# Patient Record
Sex: Female | Born: 1955 | Race: Black or African American | Hispanic: No | Smoking: Former smoker
Health system: Southern US, Community
[De-identification: ages and names within clinical notes are randomized; demographics above are authoritative.]

## PROBLEM LIST (undated history)

## (undated) DIAGNOSIS — C801 Malignant (primary) neoplasm, unspecified: Secondary | ICD-10-CM

## (undated) DIAGNOSIS — Z803 Family history of malignant neoplasm of breast: Secondary | ICD-10-CM

## (undated) DIAGNOSIS — I10 Essential (primary) hypertension: Secondary | ICD-10-CM

## (undated) DIAGNOSIS — E119 Type 2 diabetes mellitus without complications: Secondary | ICD-10-CM

## (undated) DIAGNOSIS — I2510 Atherosclerotic heart disease of native coronary artery without angina pectoris: Secondary | ICD-10-CM

## (undated) HISTORY — DX: Family history of malignant neoplasm of breast: Z80.3

---

## 1995-08-25 HISTORY — PX: WRIST SURGERY: SHX841

## 2007-08-25 HISTORY — PX: ANKLE SURGERY: SHX546

## 2018-10-12 ENCOUNTER — Other Ambulatory Visit (HOSPITAL_COMMUNITY): Payer: Self-pay | Admitting: Family Medicine

## 2018-10-12 DIAGNOSIS — N63 Unspecified lump in unspecified breast: Secondary | ICD-10-CM

## 2018-10-25 ENCOUNTER — Ambulatory Visit (HOSPITAL_COMMUNITY)
Admission: RE | Admit: 2018-10-25 | Discharge: 2018-10-25 | Disposition: A | Discharge status: Home / Self Care | Payer: PRIVATE HEALTH INSURANCE | Source: Ambulatory Visit | Attending: Family Medicine | Admitting: Family Medicine

## 2018-10-25 ENCOUNTER — Other Ambulatory Visit (HOSPITAL_COMMUNITY): Payer: Self-pay | Admitting: Family Medicine

## 2018-10-25 DIAGNOSIS — N63 Unspecified lump in unspecified breast: Secondary | ICD-10-CM | POA: Insufficient documentation

## 2018-11-01 ENCOUNTER — Ambulatory Visit (HOSPITAL_COMMUNITY)
Admission: RE | Admit: 2018-11-01 | Discharge: 2018-11-01 | Disposition: A | Discharge status: Home / Self Care | Payer: PRIVATE HEALTH INSURANCE | Source: Ambulatory Visit | Attending: Family Medicine | Admitting: Family Medicine

## 2018-11-01 ENCOUNTER — Other Ambulatory Visit (HOSPITAL_COMMUNITY): Payer: Self-pay | Admitting: Family Medicine

## 2018-11-01 DIAGNOSIS — C50412 Malignant neoplasm of upper-outer quadrant of left female breast: Secondary | ICD-10-CM | POA: Insufficient documentation

## 2018-11-01 DIAGNOSIS — C773 Secondary and unspecified malignant neoplasm of axilla and upper limb lymph nodes: Secondary | ICD-10-CM | POA: Diagnosis not present

## 2018-11-01 DIAGNOSIS — R928 Other abnormal and inconclusive findings on diagnostic imaging of breast: Secondary | ICD-10-CM | POA: Diagnosis present

## 2018-11-01 DIAGNOSIS — N63 Unspecified lump in unspecified breast: Secondary | ICD-10-CM

## 2018-11-01 DIAGNOSIS — Z170 Estrogen receptor positive status [ER+]: Secondary | ICD-10-CM | POA: Diagnosis not present

## 2018-11-01 MED ORDER — LIDOCAINE HCL (PF) 2 % IJ SOLN
INTRAMUSCULAR | Status: AC
  Filled 2018-11-01: qty 10

## 2018-11-01 MED ORDER — LIDOCAINE-EPINEPHRINE (PF) 1 %-1:200000 IJ SOLN
INTRAMUSCULAR | Status: AC
  Administered 2018-11-01: 14 mL
  Filled 2018-11-01: qty 30

## 2018-11-07 ENCOUNTER — Other Ambulatory Visit (HOSPITAL_COMMUNITY): Payer: Self-pay | Admitting: Family Medicine

## 2018-11-08 ENCOUNTER — Other Ambulatory Visit (HOSPITAL_COMMUNITY): Payer: Self-pay | Admitting: Family Medicine

## 2018-11-08 DIAGNOSIS — D0512 Intraductal carcinoma in situ of left breast: Secondary | ICD-10-CM

## 2018-11-17 ENCOUNTER — Other Ambulatory Visit: Payer: Self-pay

## 2018-11-17 ENCOUNTER — Ambulatory Visit (HOSPITAL_COMMUNITY)
Admission: RE | Admit: 2018-11-17 | Discharge: 2018-11-17 | Disposition: A | Discharge status: Home / Self Care | Payer: PRIVATE HEALTH INSURANCE | Source: Ambulatory Visit | Attending: Family Medicine | Admitting: Family Medicine

## 2018-11-17 DIAGNOSIS — D0512 Intraductal carcinoma in situ of left breast: Secondary | ICD-10-CM | POA: Diagnosis present

## 2018-11-17 LAB — POCT I-STAT CREATININE: Creatinine, Ser: 0.6 mg/dL (ref 0.44–1.00)

## 2018-11-17 MED ORDER — GADOBUTROL 1 MMOL/ML IV SOLN
10.0000 mL | Freq: Once | INTRAVENOUS | Status: AC | PRN
  Administered 2018-11-17: 8 mL via INTRAVENOUS

## 2018-11-23 ENCOUNTER — Other Ambulatory Visit: Payer: Self-pay | Admitting: Family Medicine

## 2018-11-24 ENCOUNTER — Other Ambulatory Visit: Payer: Self-pay | Admitting: Family Medicine

## 2018-11-24 ENCOUNTER — Other Ambulatory Visit (HOSPITAL_COMMUNITY): Payer: Self-pay | Admitting: Family Medicine

## 2018-11-24 DIAGNOSIS — D0512 Intraductal carcinoma in situ of left breast: Secondary | ICD-10-CM

## 2018-11-24 DIAGNOSIS — R9389 Abnormal findings on diagnostic imaging of other specified body structures: Secondary | ICD-10-CM

## 2018-11-24 DIAGNOSIS — C969 Malignant neoplasm of lymphoid, hematopoietic and related tissue, unspecified: Secondary | ICD-10-CM

## 2018-12-01 ENCOUNTER — Ambulatory Visit (HOSPITAL_COMMUNITY)
Admission: RE | Admit: 2018-12-01 | Discharge: 2018-12-01 | Disposition: A | Discharge status: Home / Self Care | Payer: PRIVATE HEALTH INSURANCE | Source: Ambulatory Visit | Attending: Family Medicine | Admitting: Family Medicine

## 2018-12-01 ENCOUNTER — Other Ambulatory Visit: Payer: Self-pay

## 2018-12-01 DIAGNOSIS — D0512 Intraductal carcinoma in situ of left breast: Secondary | ICD-10-CM | POA: Diagnosis not present

## 2018-12-01 DIAGNOSIS — C969 Malignant neoplasm of lymphoid, hematopoietic and related tissue, unspecified: Secondary | ICD-10-CM

## 2018-12-01 LAB — GLUCOSE, CAPILLARY: Glucose-Capillary: 120 mg/dL — ABNORMAL HIGH (ref 70–99)

## 2018-12-01 MED ORDER — FLUDEOXYGLUCOSE F - 18 (FDG) INJECTION
9.3300 | Freq: Once | INTRAVENOUS | Status: AC | PRN
  Administered 2018-12-01: 09:00:00 9.33 via INTRAVENOUS

## 2018-12-02 ENCOUNTER — Inpatient Hospital Stay: Admission: RE | Admit: 2018-12-02 | Payer: PRIVATE HEALTH INSURANCE | Source: Ambulatory Visit

## 2018-12-02 ENCOUNTER — Ambulatory Visit
Admission: RE | Admit: 2018-12-02 | Discharge: 2018-12-02 | Disposition: A | Discharge status: Home / Self Care | Payer: PRIVATE HEALTH INSURANCE | Source: Ambulatory Visit | Attending: Family Medicine | Admitting: Family Medicine

## 2018-12-02 DIAGNOSIS — R9389 Abnormal findings on diagnostic imaging of other specified body structures: Secondary | ICD-10-CM

## 2018-12-02 MED ORDER — GADOBUTROL 1 MMOL/ML IV SOLN
10.0000 mL | Freq: Once | INTRAVENOUS | Status: AC | PRN
  Administered 2018-12-02: 09:00:00 10 mL via INTRAVENOUS

## 2018-12-13 ENCOUNTER — Other Ambulatory Visit: Payer: Self-pay | Admitting: Family Medicine

## 2018-12-13 DIAGNOSIS — D0512 Intraductal carcinoma in situ of left breast: Secondary | ICD-10-CM

## 2018-12-20 ENCOUNTER — Other Ambulatory Visit: Payer: Self-pay

## 2018-12-20 ENCOUNTER — Encounter: Payer: Self-pay | Admitting: General Surgery

## 2018-12-20 ENCOUNTER — Ambulatory Visit (INDEPENDENT_AMBULATORY_CARE_PROVIDER_SITE_OTHER): Payer: PRIVATE HEALTH INSURANCE | Admitting: General Surgery

## 2018-12-20 VITALS — BP 206/78 | HR 77 | Temp 98.3°F | Resp 18 | Wt 197.0 lb

## 2018-12-20 DIAGNOSIS — C50412 Malignant neoplasm of upper-outer quadrant of left female breast: Secondary | ICD-10-CM

## 2018-12-20 DIAGNOSIS — Z170 Estrogen receptor positive status [ER+]: Secondary | ICD-10-CM | POA: Diagnosis not present

## 2018-12-20 NOTE — Progress Notes (Addendum)
Bridget Richardson; [id number]; 10-Sep-1955   HPI Patient is a 63 year old black female who was referred to my care by Dr. Franchot Heidelberg for evaluation treatment of left breast cancer.  Patient states she noticed a lump several months ago.  She has had extensive work-up including mammograms, biopsy, MRI which reveals a 2.4 cm tumor in the upper, outer quadrant of the left breast with biopsy positive left axillary lymph node.  Patient states she had a sister who she thinks had breast cancer that had metastasized.  Her sister has since passed away.  She denies any left breast pain.  She denies any nipple discharge. History reviewed. No pertinent past medical history.  History reviewed. No pertinent surgical history.  History reviewed. No pertinent family history.  Current Outpatient Medications on File Prior to Visit  Medication Sig Dispense Refill  . atorvastatin (LIPITOR) 40 MG tablet Take 40 mg by mouth daily.    . enalapril (VASOTEC) 20 MG tablet Take 20 mg by mouth 2 (two) times daily.    . metFORMIN (GLUCOPHAGE) 850 MG tablet Take 850 mg by mouth 1 day or 1 dose.     No current facility-administered medications on file prior to visit.     Not on File  Social History   Substance and Sexual Activity  Alcohol Use Never  . Frequency: Never    Social History   Tobacco Use  Smoking Status Former Smoker  . Types: Cigarettes  . Last attempt to quit: 1983  . Years since quitting: 37.3  Smokeless Tobacco Never Used    Review of Systems  Constitutional: Negative.   HENT: Negative.   Eyes: Negative.   Respiratory: Negative.   Cardiovascular: Negative.   Gastrointestinal: Negative.   Genitourinary: Negative.   Musculoskeletal: Negative.   Skin: Negative.   Neurological: Negative.   Endo/Heme/Allergies: Negative.   Psychiatric/Behavioral: Negative.     Objective   Vitals:   12/20/18 0925  BP: (!) 206/78  Pulse: 77  Resp: 18  Temp: 98.3 F (36.8 C)  SpO2: 96%    Physical  Exam Vitals signs reviewed. Exam conducted with a chaperone present.  Constitutional:      Appearance: Normal appearance.  HENT:     Head: Normocephalic and atraumatic.  Neck:     Musculoskeletal: Normal range of motion and neck supple.  Cardiovascular:     Rate and Rhythm: Normal rate and regular rhythm.     Heart sounds: Normal heart sounds. No murmur. No friction rub. No gallop.   Pulmonary:     Effort: Pulmonary effort is normal. No respiratory distress.     Breath sounds: Normal breath sounds. No stridor. No wheezing, rhonchi or rales.  Chest:     Chest wall: No tenderness.  Lymphadenopathy:     Cervical: No cervical adenopathy.  Skin:    General: Skin is warm and dry.  Neurological:     Mental Status: She is alert and oriented to person, place, and time.   Breast: Dominant well circumscribed mass in the upper, outer quadrant of the left breast with palpable lymph nodes in the left axilla.  Mild skin dimpling is noted over the mass.  No nipple discharge noted.  Right breast reveals no dominant mass, nipple discharge, or dimpling.  The axilla is negative for palpable nodes.  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  She realizes that neither affect the type of chemotherapy she may need.  She is leaning towards the left partial mastectomy with axillary dissection.  The risks and benefits of both procedures including bleeding, infection, the possibility of unclear margins, and the possibility of pain or swelling in the left arm were fully explained to the patient, who gave informed consent.  Patient will call me with her decision.  As per COVID-19 protocol, the surgery will be done soon given her  cancer diagnosis.

## 2018-12-20 NOTE — Patient Instructions (Signed)
Surgical Options for Early-Stage Breast Cancer  Surgery is usually the first treatment for early-stage breast cancer. Most women have two surgery options. One is called partial mastectomy, or breast-sparing or breast-conserving surgery, and the other is called mastectomy. Both surgeries have good survival rates. Breast cancer is different for everyone, even in its early stage. The best treatment for one person might not be the best treatment for another. Learn as much as you can about your cancer and work closely with your health care providers to make the choice that produces the best results for you. What is partial mastectomy? Partial mastectomy, also called breast-sparing surgery or breast-conserving surgery, is surgery to remove the cancer along with some normal breast tissue that surrounds it. Lymph nodes from under the arm may also be removed and tested to find out if the cancer has spread. If cancer is located near the chest wall, part of the chest wall lining may also be removed. What is a mastectomy? A mastectomy is surgery to remove the cancer along with the entire breast tissue. There are several types of mastectomy:  Simple or total mastectomy. In this surgery the entire breast is removed, including breast tissue, nipple, areola and skin around the breast. Some lymph nodes may also be removed from under the arm. If cancer is located near the chest wall, part of the chest wall lining may also be removed.  Skin-sparing mastectomy. In this surgery the breast tissue, nipple, and areola are removed and most of the skin over the breast is left in place. This surgery results in less scar tissue than other mastectomy surgeries, which allows for a more natural breast reconstruction.  Nipple-sparing mastectomy. In this surgery, breast tissue is removed but the skin and nipple is left in place. The tissue under the nipple and areola may be removed if cancer is found in the area. This may be an option  for women who choose to have breast reconstruction after mastectomy.  Modified radical mastectomy. This surgery is the same as a simple mastectomy but also includes removing lymph nodes from under the arm (axillary lymph node dissection).  Radical mastectomy. In this surgery the entire breast, the lymph nodes under the arm, and the chest wall muscles under the breast are removed. This surgery is rarely done now. A modified radical mastectomy is preferred because it is just as effective, but with the added advantage of fewer side effects. What are some advantages and disadvantages of these surgeries? Partial mastectomy Advantages of partial mastectomy include:  Keeping most of your breast tissue intact, allowing for a more natural look to the breast.  Easier recovery when compared to a mastectomy.  Ability to go home on the day of the procedure. Disadvantages of partial mastectomy include:  Slightly higher risk that your cancer will come back.  Needing more surgery at a later time.  Requiring radiation therapy after surgery, which has side effects and possible complications. This is done to reduce the chances of breast cancer returning. Mastectomy Advantages of a mastectomy include:  Not needing to have radiation therapy or other treatments after surgery.  Lower chances of your cancer coming back. Disadvantages of a mastectomy include:  Longer recovery time compared to partial mastectomy.  Possibility of more complications.  Requiring additional surgeries to reconstruct your breast. Where to find more information  National Cancer Institute: https://www.cancer.gov  American Cancer Society: http://www.cancer.org Questions to ask Here are some questions to ask about each surgery:  What will my recovery be   like?  How will my breast look and feel?  What are the possible risks and complications of the surgery?  What additional treatment might I need after surgery?  What are  the risks and complications of radiation therapy?  What are the risks and complications of chemotherapy?  Will I be able to have breast reconstruction? Summary  Surgery is usually the first treatment for early-stage breast cancer. Most women have two surgery options.  One option is called partial mastectomy, or breast-sparing or breast-conserving surgery, and the other is called mastectomy. Both surgeries have good survival rates.  Each option has advantages and disadvantages to consider. The best treatment for one person might not be the best treatment for you.  Learn as much as you can about your cancer and work closely with your health care providers to make the choice that produces the best results for you. This information is not intended to replace advice given to you by your health care provider. Make sure you discuss any questions you have with your health care provider. Document Released: 10/31/2003 Document Revised: 11/05/2016 Document Reviewed: 11/05/2016 Elsevier Interactive Patient Education  2019 Else[employer] Inc[employer]  

## 2018-12-27 NOTE — H&P (Signed)
Bridget Richardson; [id number]; 02/12/1956   HPI Patient is a 63 year old black female who was referred to my care by Dr. Franchot Heidelberg for evaluation treatment of left breast cancer.  Patient states she noticed a lump several months ago.  She has had extensive work-up including mammograms, biopsy, MRI which reveals a 2.4 cm tumor in the upper, outer quadrant of the left breast with biopsy positive left axillary lymph node.  Patient states she had a sister who she thinks had breast cancer that had metastasized.  Her sister has since passed away.  She denies any left breast pain.  She denies any nipple discharge. History reviewed. No pertinent past medical history.  History reviewed. No pertinent surgical history.  History reviewed. No pertinent family history.  Current Outpatient Medications on File Prior to Visit  Medication Sig Dispense Refill  . atorvastatin (LIPITOR) 40 MG tablet Take 40 mg by mouth daily.    . enalapril (VASOTEC) 20 MG tablet Take 20 mg by mouth 2 (two) times daily.    . metFORMIN (GLUCOPHAGE) 850 MG tablet Take 850 mg by mouth 1 day or 1 dose.     No current facility-administered medications on file prior to visit.     Not on File  Social History   Substance and Sexual Activity  Alcohol Use Never  . Frequency: Never    Social History   Tobacco Use  Smoking Status Former Smoker  . Types: Cigarettes  . Last attempt to quit: 1983  . Years since quitting: 37.3  Smokeless Tobacco Never Used    Review of Systems  Constitutional: Negative.   HENT: Negative.   Eyes: Negative.   Respiratory: Negative.   Cardiovascular: Negative.   Gastrointestinal: Negative.   Genitourinary: Negative.   Musculoskeletal: Negative.   Skin: Negative.   Neurological: Negative.   Endo/Heme/Allergies: Negative.   Psychiatric/Behavioral: Negative.     Objective   Vitals:   12/20/18 0925  BP: (!) 206/78  Pulse: 77  Resp: 18  Temp: 98.3 F (36.8 C)  SpO2: 96%    Physical  Exam Vitals signs reviewed. Exam conducted with a chaperone present.  Constitutional:      Appearance: Normal appearance.  HENT:     Head: Normocephalic and atraumatic.  Neck:     Musculoskeletal: Normal range of motion and neck supple.  Cardiovascular:     Rate and Rhythm: Normal rate and regular rhythm.     Heart sounds: Normal heart sounds. No murmur. No friction rub. No gallop.   Pulmonary:     Effort: Pulmonary effort is normal. No respiratory distress.     Breath sounds: Normal breath sounds. No stridor. No wheezing, rhonchi or rales.  Chest:     Chest wall: No tenderness.  Lymphadenopathy:     Cervical: No cervical adenopathy.  Skin:    General: Skin is warm and dry.  Neurological:     Mental Status: She is alert and oriented to person, place, and time.   Breast: Dominant well circumscribed mass in the upper, outer quadrant of the left breast with palpable lymph nodes in the left axilla.  Mild skin dimpling is noted over the mass.  No nipple discharge noted.  Right breast reveals no dominant mass, nipple discharge, or dimpling.  The axilla is negative for palpable nodes.  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  She realizes that neither affect the type of chemotherapy she may need.  She is leaning towards the left partial mastectomy with axillary dissection.  The risks and benefits of both procedures including bleeding, infection, the possibility of unclear margins, and the possibility of pain or swelling in the left arm were fully explained to the patient, who gave informed consent.  Patient will call me with her decision.  As per COVID-19 protocol, the surgery will be done soon given her  cancer diagnosis. Addendum: Patient has elected to proceed with a left modified radical mastectomy.

## 2018-12-27 NOTE — Patient Instructions (Signed)
Bridget Richardson  12/27/2018     @PREFPERIOPPHARMACY @   Your procedure is scheduled on  01/04/2019 .  Report to [facility] at  615  A.M.  Call this number if you have problems the morning of surgery:  [phone]   Remember:  Do not eat or drink after midnight.                       Take these medicines the morning of surgery with A SIP OF WATER  Coreg, vasotec.    Do not wear jewelry, make-up or nail polish.  Do not wear lotions, powders, or perfumes, or deodorant.  Do not shave 48 hours prior to surgery.  Men may shave face and neck.  Do not bring valuables to the hospital.  [facility] is not responsible for any belongings or valuables.  Contacts, dentures or bridgework may not be worn into surgery.  Leave your suitcase in the car.  After surgery it may be brought to your room.  For patients admitted to the hospital, discharge time will be determined by your treatment team.  Patients discharged the day of surgery will not be allowed to drive home.   Name and phone number of your driver:   family Special instructions:  DO NOT take any medications for diabetes the morning of your surgery.  Please read over the following fact sheets that you were given. Pain Booklet, Coughing and Deep Breathing, Blood Transfusion Information, MRSA Information, Surgical Site Infection Prevention, Anesthesia Post-op Instructions and Care and Recovery After Surgery       Total or Modified Radical Mastectomy, Care After This sheet gives you information about how to care for yourself after your procedure. Your health care provider may also give you more specific instructions. If you have problems or questions, contact your health care provider. What can I expect after the procedure? After the procedure, it is common to have:  Pain.  Numbness.  Stiffness in the arm or shoulder.  Feelings of stress, sadness, or depression. If the lymph nodes under your arm were removed, you may have  arm swelling, weakness, or numbness on the same side of your body as your surgery. Follow these instructions at home: Incision care   Follow instructions from your health care provider about how to take care of your incision. Make sure you: ? Wash your hands with soap and water before you change your bandage (dressing). If soap and water are not available, use hand sanitizer. ? Change your dressing as told by your health care provider. ? Leave stitches (sutures), skin glue, or adhesive strips in place. These skin closures may need to stay in place for 2 weeks or longer. If adhesive strip edges start to loosen and curl up, you may trim the loose edges. Do not remove adhesive strips completely unless your health care provider tells you to do that.  Check your incision area every day for signs of infection. Check for: ? Redness, swelling, or more pain. ? Fluid or blood. ? Warmth. ? Pus or a bad smell.  If you were sent home with a surgical drain in place, follow instructions from your health care provider about emptying it. Bathing  Do not take baths, swim, or use a hot tub until your health care provider approves. Ask your health care provider if you may take showers. You may only be allowed to take sponge baths. Activity  Return to your normal activities  as told by your health care provider. Ask your health care provider what activities are safe for you.  Avoid activities that take a lot of effort.  Be careful to avoid any activities that could cause an injury to your arm on the side of your surgery.  Do not lift anything that is heavier than 10 lb (4.5 kg), or the limit that you are told, until your health care provider says that it is safe.  Avoid lifting with the arm on the side of your surgery.  Do not carry heavy objects on your shoulder.  After your drain is removed, do exercises to prevent stiffness and swelling in your arm. Talk with your health care provider about which  exercises are safe for you. General instructions  Take over-the-counter and prescription medicines only as told by your health care provider.  You may eat what you usually do.  Keep your arm raised (elevated) above the level of your heart when you are sitting or lying down.  Do not wear tight jewelry on your arm, wrist, or fingers on the side of your surgery.  You may be given a tight sleeve (compression bandage) to wear over your arm on the side of your surgery. Wear this sleeve as told by your health care provider.  Ask your health care provider when you can start wearing a bra or using a breast prosthesis.  Before you are involved in certain procedures such as giving blood or having your blood pressure checked, tell all your health care providers if lymph nodes under your arm were removed. This is important information. Follow-up  Keep all follow-up visits as told by your health care provider. This is important.  Get checked for extra fluid around your lymph nodes (lymphedema) as often as told by your health care provider. Contact a health care provider if:  You have a fever.  Your pain medicine is not working.  Your arm swelling, weakness, or numbness has not improved after a few weeks.  You have new swelling in your breast area or arm.  You have redness, swelling, or more pain in your incision area.  You have fluid or blood coming from your incision.  Your incision feels warm to the touch.  You have pus or a bad smell coming from your incision. Get help right away if:  You have very bad pain in your breast area or arm.  You have chest pain.  You have difficulty breathing. Summary  Follow instructions from your health care provider about how to take care of your incision. Check your incision area every day for signs of infection.  Ask your health care provider what activities are safe for you.  Keep all follow-up visits as told by your health care provider. This  is important.  Make sure you know which symptoms should cause you to contact your health care provider or to get help right away. This information is not intended to replace advice given to you by your health care provider. Make sure you discuss any questions you have with your health care provider. Document Released: 04/02/2004 Document Revised: 05/14/2017 Document Reviewed: 05/14/2017 Elsevier Interactive Patient Education  2019 [employer] Anesthesia, Adult, Care After This sheet gives you information about how to care for yourself after your procedure. Your health care provider may also give you more specific instructions. If you have problems or questions, contact your health care provider. What can I expect after the procedure? After the procedure, the following side  effects are common:  Pain or discomfort at the IV site.  Nausea.  Vomiting.  Sore throat.  Trouble concentrating.  Feeling cold or chills.  Weak or tired.  Sleepiness and fatigue.  Soreness and body aches. These side effects can affect parts of the body that were not involved in surgery. Follow these instructions at home:  For at least 24 hours after the procedure:  Have a responsible adult stay with you. It is important to have someone help care for you until you are awake and alert.  Rest as needed.  Do not: ? Participate in activities in which you could fall or become injured. ? Drive. ? Use heavy machinery. ? Drink alcohol. ? Take sleeping pills or medicines that cause drowsiness. ? Make important decisions or sign legal documents. ? Take care of children on your own. Eating and drinking  Follow any instructions from your health care provider about eating or drinking restrictions.  When you feel hungry, start by eating small amounts of foods that are soft and easy to digest (bland), such as toast. Gradually return to your regular diet.  Drink enough fluid to keep your urine pale  yellow.  If you vomit, rehydrate by drinking water, juice, or clear broth. General instructions  If you have sleep apnea, surgery and certain medicines can increase your risk for breathing problems. Follow instructions from your health care provider about wearing your sleep device: ? Anytime you are sleeping, including during daytime naps. ? While taking prescription pain medicines, sleeping medicines, or medicines that make you drowsy.  Return to your normal activities as told by your health care provider. Ask your health care provider what activities are safe for you.  Take over-the-counter and prescription medicines only as told by your health care provider.  If you smoke, do not smoke without supervision.  Keep all follow-up visits as told by your health care provider. This is important. Contact a health care provider if:  You have nausea or vomiting that does not get better with medicine.  You cannot eat or drink without vomiting.  You have pain that does not get better with medicine.  You are unable to pass urine.  You develop a skin rash.  You have a fever.  You have redness around your IV site that gets worse. Get help right away if:  You have difficulty breathing.  You have chest pain.  You have blood in your urine or stool, or you vomit blood. Summary  After the procedure, it is common to have a sore throat or nausea. It is also common to feel tired.  Have a responsible adult stay with you for the first 24 hours after general anesthesia. It is important to have someone help care for you until you are awake and alert.  When you feel hungry, start by eating small amounts of foods that are soft and easy to digest (bland), such as toast. Gradually return to your regular diet.  Drink enough fluid to keep your urine pale yellow.  Return to your normal activities as told by your health care provider. Ask your health care provider what activities are safe for you.  This information is not intended to replace advice given to you by your health care provider. Make sure you discuss any questions you have with your health care provider. Document Released: 11/16/2000 Document Revised: 03/26/2017 Document Reviewed: 03/26/2017 Elsevier Interactive Patient Education  2019 [employer].

## 2018-12-30 ENCOUNTER — Other Ambulatory Visit: Payer: Self-pay

## 2018-12-30 ENCOUNTER — Encounter (HOSPITAL_COMMUNITY): Payer: Self-pay

## 2018-12-30 ENCOUNTER — Ambulatory Visit (HOSPITAL_COMMUNITY)
Admission: RE | Admit: 2018-12-30 | Discharge: 2018-12-30 | Disposition: A | Discharge status: Home / Self Care | Payer: PRIVATE HEALTH INSURANCE | Source: Ambulatory Visit | Attending: General Surgery | Admitting: General Surgery

## 2018-12-30 ENCOUNTER — Encounter (HOSPITAL_COMMUNITY)
Admission: RE | Admit: 2018-12-30 | Discharge: 2018-12-30 | Disposition: A | Discharge status: Home / Self Care | Payer: PRIVATE HEALTH INSURANCE | Source: Ambulatory Visit | Attending: General Surgery | Admitting: General Surgery

## 2018-12-30 DIAGNOSIS — C50912 Malignant neoplasm of unspecified site of left female breast: Secondary | ICD-10-CM | POA: Insufficient documentation

## 2018-12-30 HISTORY — DX: Essential (primary) hypertension: I10

## 2018-12-30 HISTORY — DX: Atherosclerotic heart disease of native coronary artery without angina pectoris: I25.10

## 2018-12-30 HISTORY — DX: Type 2 diabetes mellitus without complications: E11.9

## 2018-12-30 LAB — COMPREHENSIVE METABOLIC PANEL
ALT: 32 U/L (ref 0–44)
AST: 23 U/L (ref 15–41)
Albumin: 4.3 g/dL (ref 3.5–5.0)
Alkaline Phosphatase: 88 U/L (ref 38–126)
Anion gap: 10 (ref 5–15)
BUN: 14 mg/dL (ref 8–23)
CO2: 25 mmol/L (ref 22–32)
Calcium: 9.7 mg/dL (ref 8.9–10.3)
Chloride: 103 mmol/L (ref 98–111)
Creatinine, Ser: 0.56 mg/dL (ref 0.44–1.00)
GFR calc Af Amer: >60 mL/min (ref >60)
GFR calc non Af Amer: >60 mL/min (ref >60)
Glucose, Bld: 125 mg/dL — ABNORMAL HIGH (ref 70–99)
Potassium: 4.1 mmol/L (ref 3.5–5.1)
Sodium: 138 mmol/L (ref 135–145)
Total Bilirubin: 0.4 mg/dL (ref 0.3–1.2)
Total Protein: 7.8 g/dL (ref 6.5–8.1)

## 2018-12-30 LAB — CBC WITH DIFFERENTIAL/PLATELET
Abs Immature Granulocytes: 0.01 10*3/uL (ref 0.00–0.07)
Basophils Absolute: 0 10*3/uL (ref 0.0–0.1)
Basophils Relative: 0 %
Eosinophils Absolute: 0.2 10*3/uL (ref 0.0–0.5)
Eosinophils Relative: 5 %
HCT: 37.8 % (ref 36.0–46.0)
Hemoglobin: 12 g/dL (ref 12.0–15.0)
Immature Granulocytes: 0 %
Lymphocytes Relative: 32 %
Lymphs Abs: 1.7 10*3/uL (ref 0.7–4.0)
MCH: 28.4 pg (ref 26.0–34.0)
MCHC: 31.7 g/dL (ref 30.0–36.0)
MCV: 89.4 fL (ref 80.0–100.0)
Monocytes Absolute: 0.6 10*3/uL (ref 0.1–1.0)
Monocytes Relative: 12 %
Neutro Abs: 2.8 10*3/uL (ref 1.7–7.7)
Neutrophils Relative %: 51 %
Platelets: 270 10*3/uL (ref 150–400)
RBC: 4.23 MIL/uL (ref 3.87–5.11)
RDW: 13.2 % (ref 11.5–15.5)
WBC: 5.4 10*3/uL (ref 4.0–10.5)
nRBC: 0 % (ref 0.0–0.2)

## 2018-12-30 LAB — TYPE AND SCREEN
ABO/RH(D): B POS
Antibody Screen: NEGATIVE

## 2018-12-30 LAB — HEMOGLOBIN A1C
Hgb A1c MFr Bld: 6.4 % — ABNORMAL HIGH (ref 4.8–5.6)
Mean Plasma Glucose: 136.98 mg/dL

## 2018-12-30 LAB — GLUCOSE, CAPILLARY: Glucose-Capillary: 121 mg/dL — ABNORMAL HIGH (ref 70–99)

## 2019-01-04 ENCOUNTER — Encounter (HOSPITAL_COMMUNITY)
Admission: RE | Disposition: A | Discharge status: Home / Self Care | Payer: Self-pay | Source: Home / Self Care | Attending: General Surgery

## 2019-01-04 ENCOUNTER — Observation Stay (HOSPITAL_COMMUNITY)
Admission: RE | Admit: 2019-01-04 | Discharge: 2019-01-05 | Disposition: A | Discharge status: Home / Self Care | Payer: PRIVATE HEALTH INSURANCE | Attending: General Surgery | Admitting: General Surgery

## 2019-01-04 ENCOUNTER — Ambulatory Visit (HOSPITAL_COMMUNITY): Payer: PRIVATE HEALTH INSURANCE | Admitting: Anesthesiology

## 2019-01-04 ENCOUNTER — Encounter (HOSPITAL_COMMUNITY): Payer: Self-pay

## 2019-01-04 ENCOUNTER — Other Ambulatory Visit: Payer: Self-pay

## 2019-01-04 DIAGNOSIS — C50412 Malignant neoplasm of upper-outer quadrant of left female breast: Secondary | ICD-10-CM | POA: Diagnosis not present

## 2019-01-04 DIAGNOSIS — I2510 Atherosclerotic heart disease of native coronary artery without angina pectoris: Secondary | ICD-10-CM | POA: Insufficient documentation

## 2019-01-04 DIAGNOSIS — Z1159 Encounter for screening for other viral diseases: Secondary | ICD-10-CM | POA: Insufficient documentation

## 2019-01-04 DIAGNOSIS — Z87891 Personal history of nicotine dependence: Secondary | ICD-10-CM | POA: Insufficient documentation

## 2019-01-04 DIAGNOSIS — E119 Type 2 diabetes mellitus without complications: Secondary | ICD-10-CM | POA: Insufficient documentation

## 2019-01-04 DIAGNOSIS — I10 Essential (primary) hypertension: Secondary | ICD-10-CM | POA: Insufficient documentation

## 2019-01-04 DIAGNOSIS — C773 Secondary and unspecified malignant neoplasm of axilla and upper limb lymph nodes: Secondary | ICD-10-CM | POA: Insufficient documentation

## 2019-01-04 DIAGNOSIS — Z79899 Other long term (current) drug therapy: Secondary | ICD-10-CM | POA: Diagnosis not present

## 2019-01-04 DIAGNOSIS — Z7984 Long term (current) use of oral hypoglycemic drugs: Secondary | ICD-10-CM | POA: Insufficient documentation

## 2019-01-04 DIAGNOSIS — C50919 Malignant neoplasm of unspecified site of unspecified female breast: Secondary | ICD-10-CM | POA: Diagnosis present

## 2019-01-04 DIAGNOSIS — Z170 Estrogen receptor positive status [ER+]: Secondary | ICD-10-CM | POA: Diagnosis not present

## 2019-01-04 DIAGNOSIS — D242 Benign neoplasm of left breast: Secondary | ICD-10-CM | POA: Insufficient documentation

## 2019-01-04 DIAGNOSIS — C50912 Malignant neoplasm of unspecified site of left female breast: Secondary | ICD-10-CM | POA: Diagnosis present

## 2019-01-04 HISTORY — PX: MASTECTOMY MODIFIED RADICAL: SHX5962

## 2019-01-04 LAB — GLUCOSE, CAPILLARY
Glucose-Capillary: 122 mg/dL — ABNORMAL HIGH (ref 70–99)
Glucose-Capillary: 130 mg/dL — ABNORMAL HIGH (ref 70–99)
Glucose-Capillary: 101 mg/dL — ABNORMAL HIGH (ref 70–99)

## 2019-01-04 LAB — SARS CORONAVIRUS 2 BY RT PCR (HOSPITAL ORDER, PERFORMED IN ~~LOC~~ HOSPITAL LAB): SARS Coronavirus 2: NEGATIVE

## 2019-01-04 SURGERY — MASTECTOMY, MODIFIED RADICAL
Anesthesia: General | Site: Breast | Laterality: Left

## 2019-01-04 MED ORDER — ENOXAPARIN SODIUM 40 MG/0.4ML ~~LOC~~ SOLN
SUBCUTANEOUS | Status: AC
  Filled 2019-01-04: qty 0.4

## 2019-01-04 MED ORDER — HYDROCORTISONE (PERIANAL) 2.5 % EX CREA
1.0000 "application " | TOPICAL_CREAM | Freq: Four times a day (QID) | CUTANEOUS | Status: DC | PRN
  Filled 2019-01-04: qty 28.35

## 2019-01-04 MED ORDER — 0.9 % SODIUM CHLORIDE (POUR BTL) OPTIME
TOPICAL | Status: DC | PRN
  Administered 2019-01-04: 1000 mL

## 2019-01-04 MED ORDER — DIPHENHYDRAMINE HCL 12.5 MG/5ML PO ELIX: 12.5000 mg | ORAL_SOLUTION | Freq: Four times a day (QID) | ORAL | Status: DC | PRN

## 2019-01-04 MED ORDER — BUPIVACAINE LIPOSOME 1.3 % IJ SUSP
INTRAMUSCULAR | Status: AC
  Filled 2019-01-04: qty 20

## 2019-01-04 MED ORDER — HEMOSTATIC AGENTS (NO CHARGE) OPTIME
TOPICAL | Status: DC | PRN
  Administered 2019-01-04: 1 via TOPICAL

## 2019-01-04 MED ORDER — NEOSTIGMINE METHYLSULFATE 3 MG/3ML IV SOSY
PREFILLED_SYRINGE | INTRAVENOUS | Status: AC
  Filled 2019-01-04: qty 3

## 2019-01-04 MED ORDER — CHLORHEXIDINE GLUCONATE CLOTH 2 % EX PADS: 6.0000 | MEDICATED_PAD | Freq: Once | CUTANEOUS | Status: DC

## 2019-01-04 MED ORDER — PHENYLEPHRINE HCL (PRESSORS) 10 MG/ML IV SOLN
INTRAVENOUS | Status: DC | PRN
  Administered 2019-01-04: 80 ug via INTRAVENOUS

## 2019-01-04 MED ORDER — ENOXAPARIN SODIUM 40 MG/0.4ML ~~LOC~~ SOLN
40.0000 mg | SUBCUTANEOUS | Status: DC
  Administered 2019-01-05: 08:00:00 40 mg via SUBCUTANEOUS
  Filled 2019-01-04: qty 0.4

## 2019-01-04 MED ORDER — KETOROLAC TROMETHAMINE 30 MG/ML IJ SOLN: 30.0000 mg | Freq: Four times a day (QID) | INTRAMUSCULAR | Status: DC | PRN

## 2019-01-04 MED ORDER — CEFAZOLIN SODIUM-DEXTROSE 2-4 GM/100ML-% IV SOLN
2.0000 g | INTRAVENOUS | Status: AC
  Administered 2019-01-04: 2 g via INTRAVENOUS

## 2019-01-04 MED ORDER — CEFAZOLIN SODIUM-DEXTROSE 2-4 GM/100ML-% IV SOLN
INTRAVENOUS | Status: AC
  Filled 2019-01-04: qty 100

## 2019-01-04 MED ORDER — HYDROMORPHONE HCL 1 MG/ML IJ SOLN
0.2500 mg | INTRAMUSCULAR | Status: DC | PRN
  Administered 2019-01-04: 10:00:00 0.5 mg via INTRAVENOUS
  Filled 2019-01-04: qty 0.5

## 2019-01-04 MED ORDER — PHENOL 1.4 % MT LIQD: 1.0000 | OROMUCOSAL | Status: DC | PRN

## 2019-01-04 MED ORDER — ONDANSETRON HCL 4 MG/2ML IJ SOLN: 4.0000 mg | Freq: Once | INTRAMUSCULAR | Status: DC | PRN

## 2019-01-04 MED ORDER — BUPIVACAINE LIPOSOME 1.3 % IJ SUSP
INTRAMUSCULAR | Status: DC | PRN
  Administered 2019-01-04: 20 mL

## 2019-01-04 MED ORDER — MENTHOL 3 MG MT LOZG: 1.0000 | LOZENGE | OROMUCOSAL | Status: DC | PRN

## 2019-01-04 MED ORDER — GLYCOPYRROLATE PF 0.2 MG/ML IJ SOSY
PREFILLED_SYRINGE | INTRAMUSCULAR | Status: AC
  Filled 2019-01-04: qty 1

## 2019-01-04 MED ORDER — ENALAPRIL MALEATE 5 MG PO TABS
20.0000 mg | ORAL_TABLET | Freq: Two times a day (BID) | ORAL | Status: DC
  Administered 2019-01-04 – 2019-01-05 (×2): 20 mg via ORAL
  Filled 2019-01-04 (×2): qty 4

## 2019-01-04 MED ORDER — CARVEDILOL 3.125 MG PO TABS
6.2500 mg | ORAL_TABLET | ORAL | Status: DC
  Administered 2019-01-05: 07:00:00 6.25 mg via ORAL
  Filled 2019-01-04: qty 2

## 2019-01-04 MED ORDER — GLYCOPYRROLATE PF 0.2 MG/ML IJ SOSY
PREFILLED_SYRINGE | INTRAMUSCULAR | Status: AC
  Filled 2019-01-04: qty 2

## 2019-01-04 MED ORDER — ACETAMINOPHEN 650 MG RE SUPP: 650.0000 mg | Freq: Four times a day (QID) | RECTAL | Status: DC | PRN

## 2019-01-04 MED ORDER — SUCCINYLCHOLINE 20MG/ML (10ML) SYRINGE FOR MEDFUSION PUMP - OPTIME
INTRAMUSCULAR | Status: DC | PRN
  Administered 2019-01-04: 120 mg via INTRAVENOUS

## 2019-01-04 MED ORDER — ALUM & MAG HYDROXIDE-SIMETH 200-200-20 MG/5ML PO SUSP
30.0000 mL | Freq: Four times a day (QID) | ORAL | Status: DC | PRN
  Administered 2019-01-04: 20:00:00 30 mL via ORAL
  Filled 2019-01-04: qty 30

## 2019-01-04 MED ORDER — ACETAMINOPHEN 325 MG PO TABS
650.0000 mg | ORAL_TABLET | Freq: Four times a day (QID) | ORAL | Status: DC | PRN
  Administered 2019-01-04: 22:00:00 650 mg via ORAL
  Filled 2019-01-04: qty 2

## 2019-01-04 MED ORDER — LORAZEPAM 2 MG/ML IJ SOLN: 0.5000 mg | INTRAMUSCULAR | Status: DC | PRN

## 2019-01-04 MED ORDER — MEPERIDINE HCL 50 MG/ML IJ SOLN: 6.2500 mg | INTRAMUSCULAR | Status: DC | PRN

## 2019-01-04 MED ORDER — MIDAZOLAM HCL 2 MG/2ML IJ SOLN
INTRAMUSCULAR | Status: AC
  Filled 2019-01-04: qty 2

## 2019-01-04 MED ORDER — ROCURONIUM 10MG/ML (10ML) SYRINGE FOR MEDFUSION PUMP - OPTIME
INTRAVENOUS | Status: DC | PRN
  Administered 2019-01-04: 8 mg via INTRAVENOUS
  Administered 2019-01-04: 10 mg via INTRAVENOUS
  Administered 2019-01-04: 22 mg via INTRAVENOUS

## 2019-01-04 MED ORDER — FENTANYL CITRATE (PF) 250 MCG/5ML IJ SOLN
INTRAMUSCULAR | Status: AC
  Filled 2019-01-04: qty 5

## 2019-01-04 MED ORDER — HYDROCORTISONE 1 % EX CREA
1.0000 "application " | TOPICAL_CREAM | Freq: Three times a day (TID) | CUTANEOUS | Status: DC | PRN
  Filled 2019-01-04: qty 28

## 2019-01-04 MED ORDER — DIPHENHYDRAMINE HCL 50 MG/ML IJ SOLN: 12.5000 mg | Freq: Four times a day (QID) | INTRAMUSCULAR | Status: DC | PRN

## 2019-01-04 MED ORDER — ATORVASTATIN CALCIUM 40 MG PO TABS
40.0000 mg | ORAL_TABLET | Freq: Every day | ORAL | Status: DC
  Administered 2019-01-04: 22:00:00 40 mg via ORAL
  Filled 2019-01-04: qty 1

## 2019-01-04 MED ORDER — HYDROCODONE-ACETAMINOPHEN 7.5-325 MG PO TABS: 1.0000 | ORAL_TABLET | Freq: Once | ORAL | Status: DC | PRN

## 2019-01-04 MED ORDER — ROCURONIUM BROMIDE 10 MG/ML (PF) SYRINGE
PREFILLED_SYRINGE | INTRAVENOUS | Status: AC
  Filled 2019-01-04: qty 10

## 2019-01-04 MED ORDER — KETOROLAC TROMETHAMINE 30 MG/ML IJ SOLN
30.0000 mg | Freq: Four times a day (QID) | INTRAMUSCULAR | Status: AC
  Administered 2019-01-04: 12:00:00 30 mg via INTRAVENOUS
  Filled 2019-01-04: qty 1

## 2019-01-04 MED ORDER — HYDROMORPHONE HCL 1 MG/ML IJ SOLN: 1.0000 mg | INTRAMUSCULAR | Status: DC | PRN

## 2019-01-04 MED ORDER — HYDROCODONE-ACETAMINOPHEN 5-325 MG PO TABS
1.0000 | ORAL_TABLET | ORAL | Status: DC | PRN
  Filled 2019-01-04: qty 1

## 2019-01-04 MED ORDER — POVIDONE-IODINE 10 % OINT PACKET
TOPICAL_OINTMENT | CUTANEOUS | Status: DC | PRN
  Administered 2019-01-04: 1 via TOPICAL

## 2019-01-04 MED ORDER — ONDANSETRON 4 MG PO TBDP: 4.0000 mg | ORAL_TABLET | Freq: Four times a day (QID) | ORAL | Status: DC | PRN

## 2019-01-04 MED ORDER — MIDAZOLAM HCL 5 MG/5ML IJ SOLN
INTRAMUSCULAR | Status: DC | PRN
  Administered 2019-01-04: 2 mg via INTRAVENOUS

## 2019-01-04 MED ORDER — METFORMIN HCL 850 MG PO TABS
850.0000 mg | ORAL_TABLET | Freq: Every day | ORAL | Status: DC
  Administered 2019-01-05: 08:00:00 850 mg via ORAL
  Filled 2019-01-04 (×2): qty 1

## 2019-01-04 MED ORDER — PROPOFOL 10 MG/ML IV BOLUS
INTRAVENOUS | Status: DC | PRN
  Administered 2019-01-04: 150 mg via INTRAVENOUS

## 2019-01-04 MED ORDER — SUCCINYLCHOLINE CHLORIDE 200 MG/10ML IV SOSY
PREFILLED_SYRINGE | INTRAVENOUS | Status: AC
  Filled 2019-01-04: qty 10

## 2019-01-04 MED ORDER — POVIDONE-IODINE 10 % EX OINT
TOPICAL_OINTMENT | CUTANEOUS | Status: AC
  Filled 2019-01-04: qty 1

## 2019-01-04 MED ORDER — LACTATED RINGERS IV SOLN
INTRAVENOUS | Status: DC
  Administered 2019-01-04 (×2): via INTRAVENOUS

## 2019-01-04 MED ORDER — SODIUM CHLORIDE 0.9 % IV SOLN
INTRAVENOUS | Status: DC
  Administered 2019-01-04 – 2019-01-05 (×2): via INTRAVENOUS

## 2019-01-04 MED ORDER — GUAIFENESIN-DM 100-10 MG/5ML PO SYRP: 10.0000 mL | ORAL_SOLUTION | ORAL | Status: DC | PRN

## 2019-01-04 MED ORDER — KETOROLAC TROMETHAMINE 30 MG/ML IJ SOLN
30.0000 mg | Freq: Once | INTRAMUSCULAR | Status: AC | PRN
  Administered 2019-01-04: 10:00:00 30 mg via INTRAVENOUS
  Filled 2019-01-04: qty 1

## 2019-01-04 MED ORDER — GLYCOPYRROLATE 0.2 MG/ML IJ SOLN
INTRAMUSCULAR | Status: DC | PRN
  Administered 2019-01-04: .4 mg via INTRAVENOUS

## 2019-01-04 MED ORDER — NEOSTIGMINE METHYLSULFATE 10 MG/10ML IV SOLN
INTRAVENOUS | Status: DC | PRN
  Administered 2019-01-04: 2 mg via INTRAVENOUS

## 2019-01-04 MED ORDER — FERROUS SULFATE 325 (65 FE) MG PO TABS
325.0000 mg | ORAL_TABLET | Freq: Every day | ORAL | Status: DC
  Administered 2019-01-05: 08:00:00 325 mg via ORAL
  Filled 2019-01-04: qty 1

## 2019-01-04 MED ORDER — ONDANSETRON HCL 4 MG/2ML IJ SOLN
4.0000 mg | Freq: Four times a day (QID) | INTRAMUSCULAR | Status: DC | PRN
  Administered 2019-01-04: 11:00:00 4 mg via INTRAVENOUS
  Filled 2019-01-04: qty 2

## 2019-01-04 MED ORDER — FENTANYL CITRATE (PF) 100 MCG/2ML IJ SOLN
INTRAMUSCULAR | Status: DC | PRN
  Administered 2019-01-04 (×6): 50 ug via INTRAVENOUS

## 2019-01-04 MED ORDER — PROPOFOL 10 MG/ML IV BOLUS
INTRAVENOUS | Status: AC
  Filled 2019-01-04: qty 20

## 2019-01-04 MED ORDER — ENOXAPARIN SODIUM 40 MG/0.4ML ~~LOC~~ SOLN
40.0000 mg | Freq: Once | SUBCUTANEOUS | Status: AC
  Administered 2019-01-04: 07:00:00 40 mg via SUBCUTANEOUS

## 2019-01-04 SURGICAL SUPPLY — 40 items
APPLIER CLIP 11 MED OPEN (CLIP) ×2
BINDER BREAST XLRG (GAUZE/BANDAGES/DRESSINGS) ×2 IMPLANT
CHLORAPREP W/TINT 26 (MISCELLANEOUS) ×2 IMPLANT
CLIP APPLIE 11 MED OPEN (CLIP) ×1 IMPLANT
CLOTH BEACON ORANGE TIMEOUT ST (SAFETY) ×2 IMPLANT
COVER LIGHT HANDLE STERIS (MISCELLANEOUS) ×4 IMPLANT
COVER WAND RF STERILE (DRAPES) ×2 IMPLANT
DRAPE PROXIMA HALF (DRAPES) ×2 IMPLANT
ELECT REM PT RETURN 9FT ADLT (ELECTROSURGICAL) ×2
ELECTRODE REM PT RTRN 9FT ADLT (ELECTROSURGICAL) ×1 IMPLANT
EVACUATOR DRAINAGE 10X20 100CC (DRAIN) ×1 IMPLANT
EVACUATOR SILICONE 100CC (DRAIN) ×1
GAUZE SPONGE 4X4 12PLY STRL (GAUZE/BANDAGES/DRESSINGS) ×2 IMPLANT
GLOVE BIOGEL PI IND STRL 7.0 (GLOVE) ×3 IMPLANT
GLOVE BIOGEL PI INDICATOR 7.0 (GLOVE) ×3
GLOVE SURG SS PI 7.5 STRL IVOR (GLOVE) ×2 IMPLANT
GOWN STRL REUS W/TWL LRG LVL3 (GOWN DISPOSABLE) ×6 IMPLANT
INST SET MINOR GENERAL (KITS) ×2 IMPLANT
KIT TURNOVER KIT A (KITS) ×2 IMPLANT
MANIFOLD NEPTUNE II (INSTRUMENTS) ×2 IMPLANT
NEEDLE HYPO 21X1.5 SAFETY (NEEDLE) ×2 IMPLANT
NS IRRIG 1000ML POUR BTL (IV SOLUTION) ×2 IMPLANT
PACK MINOR (CUSTOM PROCEDURE TRAY) ×2 IMPLANT
PAD ABD 5X9 TENDERSORB (GAUZE/BANDAGES/DRESSINGS) ×2 IMPLANT
PAD ARMBOARD 7.5X6 YLW CONV (MISCELLANEOUS) ×2 IMPLANT
SET BASIN LINEN APH (SET/KITS/TRAYS/PACK) ×2 IMPLANT
SPONGE DRAIN TRACH 4X4 STRL 2S (GAUZE/BANDAGES/DRESSINGS) ×2 IMPLANT
SPONGE INTESTINAL PEANUT (DISPOSABLE) ×2 IMPLANT
SPONGE LAP 18X18 RF (DISPOSABLE) ×4 IMPLANT
STAPLER VISISTAT (STAPLE) ×2 IMPLANT
SUT ETHILON 3 0 FSL (SUTURE) ×2 IMPLANT
SUT SILK 2 0 (SUTURE) ×1
SUT SILK 2 0 SH (SUTURE) ×2 IMPLANT
SUT SILK 2-0 18XBRD TIE 12 (SUTURE) ×1 IMPLANT
SUT VIC AB 2-0 CT1 27 (SUTURE) ×5
SUT VIC AB 2-0 CT1 TAPERPNT 27 (SUTURE) ×5 IMPLANT
SUT VIC AB 3-0 SH 27 (SUTURE) ×1
SUT VIC AB 3-0 SH 27X BRD (SUTURE) ×1 IMPLANT
SYR 20CC LL (SYRINGE) ×2 IMPLANT
SYR BULB IRRIGATION 50ML (SYRINGE) ×2 IMPLANT

## 2019-01-04 NOTE — Anesthesia Postprocedure Evaluation (Signed)
Anesthesia Post Note  Patient: Education administrator  Procedure(s) Performed: MASTECTOMY MODIFIED RADICAL (Left Breast)  Patient location during evaluation: PACU Anesthesia Type: General Level of consciousness: awake and alert and oriented Pain management: pain level controlled Vital Signs Assessment: post-procedure vital signs reviewed and stable Respiratory status: spontaneous breathing Cardiovascular status: blood pressure returned to baseline and stable Postop Assessment: no apparent nausea or vomiting Anesthetic complications: no     Last Vitals:  Vitals:   01/04/19 0945 01/04/19 1000  BP: (!) 166/78 (!) 160/93  Pulse: 80 77  Resp: 17 11  Temp:    SpO2: 99% 100%    Last Pain:  Vitals:   01/04/19 1000  TempSrc:   PainSc: 0-No pain                 Jaloni Davoli

## 2019-01-04 NOTE — Transfer of Care (Signed)
Immediate Anesthesia Transfer of Care Note  Patient: Bridget Richardson  Procedure(s) Performed: MASTECTOMY MODIFIED RADICAL (Left Breast)  Patient Location: PACU  Anesthesia Type:General  Level of Consciousness: awake and alert   Airway & Oxygen Therapy: Patient Spontanous Breathing  Post-op Assessment: Report given to RN  Post vital signs: Reviewed and stable  Last Vitals:  Vitals Value Taken Time  BP    Temp 36.8 C 01/04/2019  9:26 AM  Pulse 78 01/04/2019  9:28 AM  Resp 11 01/04/2019  9:28 AM  SpO2 100 % 01/04/2019  9:28 AM  Vitals shown include unvalidated device data.  Last Pain:  Vitals:   01/04/19 0648  TempSrc: Oral  PainSc: 0-No pain         Complications: No apparent anesthesia complications

## 2019-01-04 NOTE — Anesthesia Procedure Notes (Signed)
Procedure Name: Intubation Performed by: Ollen Bowl, CRNA Pre-anesthesia Checklist: Patient identified, Patient being monitored, Timeout performed, Emergency Drugs available and Suction available Patient Re-evaluated:Patient Re-evaluated prior to induction Oxygen Delivery Method: Circle system utilized Preoxygenation: Pre-oxygenation with 100% oxygen Induction Type: IV induction Laryngoscope Size: Mac and 3 Grade View: Grade I Tube type: Oral Tube size: 7.0 mm Number of attempts: 1 Airway Equipment and Method: Stylet Placement Confirmation: ETT inserted through vocal cords under direct vision,  positive ETCO2 and breath sounds checked- equal and bilateral Secured at: 21 cm Tube secured with: Tape Dental Injury: Teeth and Oropharynx as per pre-operative assessment

## 2019-01-04 NOTE — Op Note (Signed)
Patient:  Bridget Richardson  DOB:  1955-09-09  MRN:  [id number]   Preop Diagnosis: Left breast carcinoma  Postop Diagnosis: Same  Procedure: Left modified radical mastectomy  Surgeon: Aviva Signs, MD  Assistant: Curlene Labrum, MD  Anes: General endotracheal  Indications: Patient is a 63 year old black female biopsy-proven left breast carcinoma with metastatic disease to the left axilla who presents for a left modified radical mastectomy.  The risks and benefits of the procedure including bleeding, infection, cardiopulmonary difficulties, nerve injury, and the possibility of a blood transfusion were fully explained to the patient, who gave informed consent.  Procedure note: The patient was placed in supine position.  After induction of general endotracheal anesthesia, the left breast and axilla were prepped and draped using the usual sterile technique with ChloraPrep.  Surgical site confirmation was performed.  Elliptical incision was made medial to lateral around the left nipple.  A superior flap was formed to the clavicle and an inferior flap form to the chest wall.  The breast was then removed from the chest wall medial to lateral using Bovie electrocautery.  A suture was placed superiorly for orientation purposes.  The left breast was removed from the operative field and sent to pathology for further examination.  A single large approximately 2 cm lymph node along with several other smaller lymph nodes were palpated in the left axilla.  In level 2 axillary dissection was performed, sparing the thoracodorsal artery vein and nerve.  Care was taken to avoid the long thoracic nerve.  Medium clips were used for hemostasis.  The lymph nodes were removed and sent to pathology further examination.  Arista was placed in the left axillary bed.  The flaps were then irrigated with normal saline.  #10 flat Jackson-Pratt drain was placed in the flap and left axilla and brought through a separate stab wound  inferior to the incision line.  It was secured in place using a 3-0 nylon suture.  The subcutaneous layer was reapproximated using 2-0 Vicryl interrupted sutures.  Exparel was instilled into the surrounding wound.  The skin was closed using staples.  Betadine ointment and a dry sterile dressing were applied.  All tape and needle counts were correct at the end of the procedure.  The patient was extubated in the operating room and transferred to PACU in stable condition.  Complications: None  EBL: 75 cc  Specimen: Left breast, left axillary lymph nodes  Drains: Jackson-Pratt drain to left mastectomy flap and axilla

## 2019-01-04 NOTE — Anesthesia Preprocedure Evaluation (Signed)
Anesthesia Evaluation  Patient identified by MRN, date of birth, ID band Patient awake    Reviewed: Allergy & Precautions, H&P , NPO status , Patient's Chart, lab work & pertinent test results, reviewed documented beta blocker date and time   Airway Mallampati: I  TM Distance: >3 FB Neck ROM: full    Dental no notable dental hx. (+) Edentulous Upper   Pulmonary neg pulmonary ROS, former smoker,    Pulmonary exam normal breath sounds clear to auscultation       Cardiovascular Exercise Tolerance: Good hypertension, + CAD   Rhythm:regular Rate:Normal     Neuro/Psych negative neurological ROS  negative psych ROS   GI/Hepatic negative GI ROS, Neg liver ROS,   Endo/Other  negative endocrine ROSdiabetes  Renal/GU negative Renal ROS  negative genitourinary   Musculoskeletal   Abdominal   Peds  Hematology negative hematology ROS (+)   Anesthesia Other Findings   Reproductive/Obstetrics negative OB ROS                             Anesthesia Physical Anesthesia Plan  ASA: III  Anesthesia Plan: General   Post-op Pain Management:    Induction:   PONV Risk Score and Plan:   Airway Management Planned:   Additional Equipment:   Intra-op Plan:   Post-operative Plan:   Informed Consent: I have reviewed the patients History and Physical, chart, labs and discussed the procedure including the risks, benefits and alternatives for the proposed anesthesia with the patient or authorized representative who has indicated his/her understanding and acceptance.     Dental Advisory Given  Plan Discussed with: CRNA  Anesthesia Plan Comments:         Anesthesia Quick Evaluation

## 2019-01-04 NOTE — Interval H&P Note (Signed)
History and Physical Interval Note:  01/04/2019 7:13 AM  Bridget Richardson  has presented today for surgery, with the diagnosis of left breast cancer.  The various methods of treatment have been discussed with the patient and family. After consideration of risks, benefits and other options for treatment, the patient has consented to  Procedure(s): MASTECTOMY MODIFIED RADICAL (Left) as a surgical intervention.  The patient's history has been reviewed, patient examined, no change in status, stable for surgery.  I have reviewed the patient's chart and labs.  Questions were answered to the patient's satisfaction.     Aviva Signs

## 2019-01-04 NOTE — Plan of Care (Signed)
  Problem: Education: Goal: Knowledge of General Education information will improve Description Including pain rating scale, medication(s)/side effects and non-pharmacologic comfort measures Outcome: Progressing   Problem: Health Behavior/Discharge Planning: Goal: Ability to manage health-related needs will improve Outcome: Progressing   Problem: Clinical Measurements: Goal: Ability to maintain clinical measurements within normal limits will improve Outcome: Progressing Goal: Will remain free from infection Outcome: Progressing Goal: Diagnostic test results will improve Outcome: Progressing Goal: Respiratory complications will improve Outcome: Progressing Goal: Cardiovascular complication will be avoided Outcome: Progressing   Problem: Activity: Goal: Risk for activity intolerance will decrease Outcome: Progressing   Problem: Nutrition: Goal: Adequate nutrition will be maintained Outcome: Progressing   Problem: Coping: Goal: Level of anxiety will decrease Outcome: Progressing   Problem: Elimination: Goal: Will not experience complications related to bowel motility Outcome: Progressing Goal: Will not experience complications related to urinary retention Outcome: Progressing   Problem: Pain Managment: Goal: General experience of comfort will improve Outcome: Progressing   Problem: Safety: Goal: Ability to remain free from injury will improve Outcome: Progressing   Problem: Skin Integrity: Goal: Risk for impaired skin integrity will decrease Outcome: Progressing   Problem: Education: Goal: Knowledge of disease or condition will improve Outcome: Progressing   Problem: Activity: Goal: Ability to maintain or regain function will improve Outcome: Progressing   Problem: Clinical Measurements: Goal: Postoperative complications will be avoided or minimized Outcome: Progressing   Problem: Self-Concept: Goal: Ability to verbalize positive feelings about self will  improve Outcome: Progressing   Problem: Pain Management: Goal: Expressions of feelings of enhanced comfort will increase Outcome: Progressing

## 2019-01-05 ENCOUNTER — Encounter (HOSPITAL_COMMUNITY): Payer: Self-pay | Admitting: General Surgery

## 2019-01-05 DIAGNOSIS — C50412 Malignant neoplasm of upper-outer quadrant of left female breast: Secondary | ICD-10-CM | POA: Diagnosis not present

## 2019-01-05 LAB — CBC
HCT: 31.4 % — ABNORMAL LOW (ref 36.0–46.0)
Hemoglobin: 9.6 g/dL — ABNORMAL LOW (ref 12.0–15.0)
MCH: 27.9 pg (ref 26.0–34.0)
MCHC: 30.6 g/dL (ref 30.0–36.0)
MCV: 91.3 fL (ref 80.0–100.0)
Platelets: 231 10*3/uL (ref 150–400)
RBC: 3.44 MIL/uL — ABNORMAL LOW (ref 3.87–5.11)
RDW: 13.2 % (ref 11.5–15.5)
WBC: 8.1 10*3/uL (ref 4.0–10.5)
nRBC: 0 % (ref 0.0–0.2)

## 2019-01-05 LAB — BASIC METABOLIC PANEL
Anion gap: 6 (ref 5–15)
BUN: 14 mg/dL (ref 8–23)
CO2: 29 mmol/L (ref 22–32)
Calcium: 9.3 mg/dL (ref 8.9–10.3)
Chloride: 105 mmol/L (ref 98–111)
Creatinine, Ser: 0.7 mg/dL (ref 0.44–1.00)
GFR calc Af Amer: >60 mL/min (ref >60)
GFR calc non Af Amer: >60 mL/min (ref >60)
Glucose, Bld: 127 mg/dL — ABNORMAL HIGH (ref 70–99)
Potassium: 4 mmol/L (ref 3.5–5.1)
Sodium: 140 mmol/L (ref 135–145)

## 2019-01-05 MED ORDER — TRAMADOL HCL 50 MG PO TABS: 50.0000 mg | ORAL_TABLET | Freq: Four times a day (QID) | ORAL | 0 refills | Status: DC | PRN

## 2019-01-05 NOTE — Plan of Care (Signed)
  Problem: Education: Goal: Knowledge of General Education information will improve Description Including pain rating scale, medication(s)/side effects and non-pharmacologic comfort measures Outcome: Adequate for Discharge   Problem: Health Behavior/Discharge Planning: Goal: Ability to manage health-related needs will improve Outcome: Adequate for Discharge   Problem: Clinical Measurements: Goal: Ability to maintain clinical measurements within normal limits will improve Outcome: Adequate for Discharge Goal: Will remain free from infection Outcome: Adequate for Discharge Goal: Diagnostic test results will improve Outcome: Adequate for Discharge Goal: Respiratory complications will improve Outcome: Adequate for Discharge Goal: Cardiovascular complication will be avoided Outcome: Adequate for Discharge   Problem: Activity: Goal: Risk for activity intolerance will decrease Outcome: Adequate for Discharge   Problem: Nutrition: Goal: Adequate nutrition will be maintained Outcome: Adequate for Discharge   Problem: Coping: Goal: Level of anxiety will decrease Outcome: Adequate for Discharge   Problem: Elimination: Goal: Will not experience complications related to bowel motility Outcome: Adequate for Discharge Goal: Will not experience complications related to urinary retention Outcome: Adequate for Discharge   Problem: Pain Managment: Goal: General experience of comfort will improve Outcome: Adequate for Discharge   Problem: Safety: Goal: Ability to remain free from injury will improve Outcome: Adequate for Discharge   Problem: Skin Integrity: Goal: Risk for impaired skin integrity will decrease Outcome: Adequate for Discharge   Problem: Education: Goal: Knowledge of disease or condition will improve Outcome: Adequate for Discharge   Problem: Activity: Goal: Ability to maintain or regain function will improve Outcome: Adequate for Discharge   Problem: Clinical  Measurements: Goal: Postoperative complications will be avoided or minimized Outcome: Adequate for Discharge   Problem: Self-Concept: Goal: Ability to verbalize positive feelings about self will improve Outcome: Adequate for Discharge   Problem: Pain Management: Goal: Expressions of feelings of enhanced comfort will increase Outcome: Adequate for Discharge

## 2019-01-05 NOTE — Discharge Instructions (Signed)
Surgical Drain Home Care °Surgical drains are used to remove extra fluid that normally builds up in a surgical wound after surgery. A surgical drain helps to heal a surgical wound. Different kinds of surgical drains include: °· Active drains. These drains use suction to pull drainage away from the surgical wound. Drainage flows through a tube to a container outside of the body. It is important to keep the bulb or the drainage container flat (compressed) at all times, except while you empty it. Flattening the bulb or container creates suction. The two most common types of active drains are bulb drains and Hemovac drains. °· Passive drains. These drains allow fluid to drain naturally, by gravity. Drainage flows through a tube to a bandage (dressing) or a container outside of the body. Passive drains do not need to be emptied. The most common type of passive drain is the Penrose drain. °A drain is placed during surgery. Immediately after surgery, drainage is usually bright red and a little thicker than water. The drainage may gradually turn yellow or pink and become thinner. It is likely that your health care provider will remove the drain when the drainage stops or when the amount decreases to 1-2 Tbsp (15-30 mL) during a 24-hour period. °How to care for your surgical drain °It is important to care for your drain to prevent infection. If your drain is placed at your back, or any other hard-to-reach area, ask another person to assist you in performing the following steps: °· Keep the skin around the drain dry and covered with a dressing at all times. °· Check your drain area every day for signs of infection. Check for: °? More redness, swelling, or pain. °? Pus or a bad smell. °? Cloudy drainage. °Follow instructions from your health care provider about how to take care of your drain and how to change your dressing. Change your dressing at least one time every day. Change it more often if needed to keep the dressing  dry. Make sure you: °1. Gather your supplies, including: °? Tape. °? Germ-free cleaning solution (sterile saline). °? Split gauze drain sponge: 4 x 4 inches (10 x 10 cm). °? Gauze square: 4 x 4 inches (10 x 10 cm). °2. Wash your hands with soap and water before you change your dressing. If soap and water are not available, use hand sanitizer. °3. Remove the old dressing. Avoid using scissors to do that. °4. Use sterile saline to clean your skin around the drain. °5. Place the tube through the slit in a drain sponge. Place the drain sponge so that it covers your wound. °6. Place the gauze square or another drain sponge on top of the drain sponge that is on the wound. Make sure the tube is between those layers. °7. Tape the dressing to your skin. °8. If you have an active bulb or Hemovac drain, tape the drainage tube to your skin 1-2 inches (2.5-5 cm) below the place where the tube enters your body. Taping keeps the tube from pulling on any stitches (sutures) that you have. °9. Wash your hands with soap and water. °10. Write down the color of your drainage and how often you change your dressing. °How to empty your active bulb or Hemovac drain ° °1. Make sure that you have a measuring cup that you can empty your drainage into. °2. Wash your hands with soap and water. If soap and water are not available, use hand sanitizer. °3. Gently move your fingers down the   tube while squeezing very lightly. This is called stripping the tube. This clears any drainage, clots, or tissue from the tube. ? Do not pull on the tube. ? You may need to strip the tube several times every day to keep the tube clear. 4. Open the bulb cap or the drain plug. Do not touch the inside of the cap or the bottom of the plug. 5. Empty all of the drainage into the measuring cup. 6. Compress the bulb or the container and replace the cap or the plug. To compress the bulb or the container, squeeze it firmly in the middle while you close the cap or plug  the container. 7. Write down the amount of drainage that you have in each 24-hour period. If you have less than 2 Tbsp (30 mL) of drainage during 24 hours, contact your health care provider. 8. Flush the drainage down the toilet. 9. Wash your hands with soap and water. Contact a health care provider if:  You have more redness, swelling, or pain around your drain area.  The amount of drainage that you have is increasing instead of decreasing.  You have pus or a bad smell coming from your drain area.  You have a fever.  You have drainage that is cloudy.  There is a sudden stop or a sudden decrease in the amount of drainage that you have.  Your tube falls out.  Your active draindoes not stay compressedafter you empty it. Summary  Surgical drains are used to remove extra fluid that normally builds up in a surgical wound after surgery.  Different kinds of surgical drains include active drains and passive drains. Active drains use suction to pull drainage away from the surgical wound, and passive drains allow fluid to drain naturally.  It is important to care for your drain to prevent infection. If your drain is placed at your back, or any other hard-to-reach area, ask another person to assist you.  Contact your health care provider if you have more redness, swelling, or pain around your drain area. This information is not intended to replace advice given to you by your health care provider. Make sure you discuss any questions you have with your health care provider. Document Released: 08/07/2000 Document Revised: 09/02/2017 Document Reviewed: 02/27/2015 Elsevier Interactive Patient Education  2019 [employer]. Total or Modified Radical Mastectomy, Care After This sheet gives you information about how to care for yourself after your procedure. Your health care provider may also give you more specific instructions. If you have problems or questions, contact your health care  provider. What can I expect after the procedure? After the procedure, it is common to have:  Pain.  Numbness.  Stiffness in the arm or shoulder.  Feelings of stress, sadness, or depression. If the lymph nodes under your arm were removed, you may have arm swelling, weakness, or numbness on the same side of your body as your surgery. Follow these instructions at home: Incision care   Follow instructions from your health care provider about how to take care of your incision. Make sure you: ? Wash your hands with soap and water before you change your bandage (dressing). If soap and water are not available, use hand sanitizer. ? Change your dressing as told by your health care provider. ? Leave stitches (sutures), skin glue, or adhesive strips in place. These skin closures may need to stay in place for 2 weeks or longer. If adhesive strip edges start to loosen and curl up,  you may trim the loose edges. Do not remove adhesive strips completely unless your health care provider tells you to do that.  Check your incision area every day for signs of infection. Check for: ? Redness, swelling, or more pain. ? Fluid or blood. ? Warmth. ? Pus or a bad smell.  If you were sent home with a surgical drain in place, follow instructions from your health care provider about emptying it. Bathing  Do not take baths, swim, or use a hot tub until your health care provider approves. Ask your health care provider if you may take showers. You may only be allowed to take sponge baths. Activity  Return to your normal activities as told by your health care provider. Ask your health care provider what activities are safe for you.  Avoid activities that take a lot of effort.  Be careful to avoid any activities that could cause an injury to your arm on the side of your surgery.  Do not lift anything that is heavier than 10 lb (4.5 kg), or the limit that you are told, until your health care provider says that  it is safe.  Avoid lifting with the arm on the side of your surgery.  Do not carry heavy objects on your shoulder.  After your drain is removed, do exercises to prevent stiffness and swelling in your arm. Talk with your health care provider about which exercises are safe for you. General instructions  Take over-the-counter and prescription medicines only as told by your health care provider.  You may eat what you usually do.  Keep your arm raised (elevated) above the level of your heart when you are sitting or lying down.  Do not wear tight jewelry on your arm, wrist, or fingers on the side of your surgery.  You may be given a tight sleeve (compression bandage) to wear over your arm on the side of your surgery. Wear this sleeve as told by your health care provider.  Ask your health care provider when you can start wearing a bra or using a breast prosthesis.  Before you are involved in certain procedures such as giving blood or having your blood pressure checked, tell all your health care providers if lymph nodes under your arm were removed. This is important information. Follow-up  Keep all follow-up visits as told by your health care provider. This is important.  Get checked for extra fluid around your lymph nodes (lymphedema) as often as told by your health care provider. Contact a health care provider if:  You have a fever.  Your pain medicine is not working.  Your arm swelling, weakness, or numbness has not improved after a few weeks.  You have new swelling in your breast area or arm.  You have redness, swelling, or more pain in your incision area.  You have fluid or blood coming from your incision.  Your incision feels warm to the touch.  You have pus or a bad smell coming from your incision. Get help right away if:  You have very bad pain in your breast area or arm.  You have chest pain.  You have difficulty breathing. Summary  Follow instructions from your  health care provider about how to take care of your incision. Check your incision area every day for signs of infection.  Ask your health care provider what activities are safe for you.  Keep all follow-up visits as told by your health care provider. This is important.  Make sure  you know which symptoms should cause you to contact your health care provider or to get help right away. This information is not intended to replace advice given to you by your health care provider. Make sure you discuss any questions you have with your health care provider. Document Released: 04/02/2004 Document Revised: 05/14/2017 Document Reviewed: 05/14/2017 Elsevier Interactive Patient Education  2019 [employer].

## 2019-01-05 NOTE — Discharge Summary (Signed)
Physician Discharge Summary  Patient ID: Bridget Richardson MRN: [id number] DOB/AGE: 61-Feb-1957 18 y.o.  Admit date: 01/04/2019 Discharge date: 01/05/2019  Admission Diagnoses: Left breast carcinoma with metastatic disease to the left axilla  Discharge Diagnoses: Same Active Problems:   Malignant neoplasm of upper-outer quadrant of left breast in female, estrogen receptor positive ([name])   Breast cancer, left ([name]) Non-insulin-dependent diabetes mellitus Hypertension  Discharged Condition: good  Hospital Course: Patient is a 63 year old black female with left breast carcinoma with metastatic disease to the left axilla who underwent a left modified radical mastectomy on 01/04/2019.  She tolerated the surgery well.  Her postoperative course was unremarkable.  Her diet was advanced without difficulty.  Her JP drainage has been low.  The patient is being discharged home on 01/05/2019 in good and improving condition.  Final pathology is pending.  Treatments: surgery: Left modified radical mastectomy on 01/04/2019  Discharge Exam: Blood pressure 135/73, pulse 75, temperature 98.6 F (37 C), temperature source Oral, resp. rate 16, height 5\' 5"  (1.651 m), weight 89.2 kg, SpO2 99 %. General appearance: alert, cooperative and no distress Resp: clear to auscultation bilaterally Breasts: Left mastectomy site healing well.  JP drainage sanguinous in nature and low. Cardio: regular rate and rhythm, S1, S2 normal, no murmur, click, rub or gallop  Disposition: Discharge disposition: 01-Home or Self Care       Discharge Instructions    Diet Carb Modified   Complete by:  As directed    Increase activity slowly   Complete by:  As directed      Allergies as of 01/05/2019   No Known Allergies     Medication List    TAKE these medications   atorvastatin 40 MG tablet Commonly known as:  LIPITOR Take 40 mg by mouth at bedtime.   carvedilol 6.25 MG tablet Commonly known as:  COREG Take 6.25 mg  by mouth every morning.   enalapril 20 MG tablet Commonly known as:  VASOTEC Take 20 mg by mouth 2 (two) times daily.   ferrous sulfate 325 (65 FE) MG tablet Take 325 mg by mouth daily with breakfast.   metFORMIN 850 MG tablet Commonly known as:  GLUCOPHAGE Take 850 mg by mouth daily with breakfast.   traMADol 50 MG tablet Commonly known as:  Ultram Take 1 tablet (50 mg total) by mouth every 6 (six) hours as needed.      Follow-up Information    Aviva Signs, MD. Schedule an appointment as soon as possible for a visit on 01/10/2019.   Specialty:  General Surgery Contact information: [id number] [address] [phone]           Signed: Aviva Signs 01/05/2019, 8:34 AM

## 2019-01-10 ENCOUNTER — Ambulatory Visit (INDEPENDENT_AMBULATORY_CARE_PROVIDER_SITE_OTHER): Payer: Self-pay | Admitting: General Surgery

## 2019-01-10 ENCOUNTER — Ambulatory Visit: Payer: Self-pay | Admitting: General Surgery

## 2019-01-10 ENCOUNTER — Encounter: Payer: Self-pay | Admitting: General Surgery

## 2019-01-10 ENCOUNTER — Other Ambulatory Visit: Payer: Self-pay

## 2019-01-10 VITALS — BP 173/74 | HR 86 | Temp 97.5°F | Resp 16 | Ht 65.0 in | Wt 191.0 lb

## 2019-01-10 DIAGNOSIS — Z09 Encounter for follow-up examination after completed treatment for conditions other than malignant neoplasm: Secondary | ICD-10-CM

## 2019-01-10 NOTE — Progress Notes (Signed)
Subjective:     Bridget Richardson  Here for surgery follow-up status post left modified radical mastectomy.  Patient doing very well.  Denies any incisional pain.  No left arm swelling or pain noted.  She drains anywhere from 25 to 50 cc of blood in the JP drain.  It has been decreasing. Objective:    BP (!) 173/74 (BP Location: Right Arm, Patient Position: Sitting, Cuff Size: Normal)   Pulse 86   Temp (!) 97.5 F (36.4 C) (Temporal)   Resp 16   Ht 5' 5"  (1.651 m)   Wt 191 lb (86.6 kg)   SpO2 98%   BMI 31.78 kg/m   General:  alert, cooperative and no distress  Left mastectomy healing well.  Random staples removed.  JP drain left in place. Final pathology reveals a T2, N1a invasive ductal carcinoma, ER positive, PR positive, HER-2 negative.  I discussed with the pathologist the anterior margin and she says that they were true microscopic foci of cancer cells at the anterior cautery margin, but grossly the tumor was fully excised.  Patient told of these results.     Assessment:    Doing well postoperatively.    Plan:   We will continue JP drainage for 1 more week.  Patient will discussed with her primary care physician referral to an oncologist in [place].  Follow-up in 1 week.  Patient will stay out of work until I see her again.

## 2019-01-11 ENCOUNTER — Encounter (HOSPITAL_COMMUNITY): Payer: Self-pay | Admitting: *Deleted

## 2019-01-11 NOTE — Progress Notes (Signed)
Oncology Navigator Note:  Patient was referred to our office by Dr. Arnoldo Morale .  I called patient today to introduce myself and provide information in how I will be involved with her care.  I provided information on her first visit and what to expect.  I made sure patient was aware of appointment time and directions to the cancer center.  My phone number was given so that she can call me with any questions or concerns.  She voices appreciation and understanding.

## 2019-01-17 ENCOUNTER — Encounter: Payer: Self-pay | Admitting: General Surgery

## 2019-01-17 ENCOUNTER — Other Ambulatory Visit: Payer: Self-pay

## 2019-01-17 ENCOUNTER — Ambulatory Visit (INDEPENDENT_AMBULATORY_CARE_PROVIDER_SITE_OTHER): Payer: Self-pay | Admitting: General Surgery

## 2019-01-17 VITALS — BP 182/91 | HR 93 | Temp 99.7°F | Resp 18 | Wt 192.0 lb

## 2019-01-17 DIAGNOSIS — Z09 Encounter for follow-up examination after completed treatment for conditions other than malignant neoplasm: Secondary | ICD-10-CM

## 2019-01-17 NOTE — Progress Notes (Signed)
Subjective:     Bridget Richardson  Patient here for postoperative visit.  Doing very well.  Has no complaints. Objective:    BP (!) 182/91 (BP Location: Right Arm, Patient Position: Sitting, Cuff Size: Normal)   Pulse 93   Temp 99.7 F (37.6 C) (Temporal)   Resp 18   Wt 192 lb (87.1 kg)   SpO2 97%   BMI 31.95 kg/m   General:  alert, cooperative and no distress  Left mastectomy incision healing well.  Staples removed.  JP drain removed.     Assessment:    Doing well postoperatively.    Plan:   To see [name] of oncology later this week.  May return to work on 01/20/2019.  Follow-up here as needed.

## 2019-01-19 ENCOUNTER — Inpatient Hospital Stay (HOSPITAL_COMMUNITY): Payer: PRIVATE HEALTH INSURANCE | Attending: Hematology | Admitting: Hematology

## 2019-01-19 ENCOUNTER — Encounter (HOSPITAL_COMMUNITY): Payer: Self-pay | Admitting: Hematology

## 2019-01-19 ENCOUNTER — Other Ambulatory Visit: Payer: Self-pay

## 2019-01-19 VITALS — BP 166/7 | HR 87 | Temp 99.2°F | Resp 18 | Ht 65.0 in | Wt 191.8 lb

## 2019-01-19 DIAGNOSIS — Z803 Family history of malignant neoplasm of breast: Secondary | ICD-10-CM | POA: Insufficient documentation

## 2019-01-19 DIAGNOSIS — Z7984 Long term (current) use of oral hypoglycemic drugs: Secondary | ICD-10-CM | POA: Insufficient documentation

## 2019-01-19 DIAGNOSIS — Z800 Family history of malignant neoplasm of digestive organs: Secondary | ICD-10-CM | POA: Insufficient documentation

## 2019-01-19 DIAGNOSIS — Z79899 Other long term (current) drug therapy: Secondary | ICD-10-CM | POA: Diagnosis not present

## 2019-01-19 DIAGNOSIS — Z87891 Personal history of nicotine dependence: Secondary | ICD-10-CM | POA: Insufficient documentation

## 2019-01-19 DIAGNOSIS — E7800 Pure hypercholesterolemia, unspecified: Secondary | ICD-10-CM | POA: Diagnosis not present

## 2019-01-19 DIAGNOSIS — Z170 Estrogen receptor positive status [ER+]: Secondary | ICD-10-CM | POA: Insufficient documentation

## 2019-01-19 DIAGNOSIS — I10 Essential (primary) hypertension: Secondary | ICD-10-CM | POA: Diagnosis not present

## 2019-01-19 DIAGNOSIS — E119 Type 2 diabetes mellitus without complications: Secondary | ICD-10-CM | POA: Insufficient documentation

## 2019-01-19 DIAGNOSIS — C50412 Malignant neoplasm of upper-outer quadrant of left female breast: Secondary | ICD-10-CM | POA: Insufficient documentation

## 2019-01-19 NOTE — Assessment & Plan Note (Addendum)
1.  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  Mammogram on 10/25/2018 shows left breast 1 o'clock position, 2.5 x 2.2 x 2 centimeter mass with enlarged lymph node in the left axilla measuring 1.8 x 1.6 x 1.4 cm. - Biopsy on 11/01/2018 shows IDC, grade 3, ER/PR positive, Ki-67 70%, HER-2 2+ - Right breast needle biopsy on 12/02/2018 with benign breast parenchyma with fibrocystic change. - Left MRM on 01/04/2019, 3.2 cm IDC, grade 3, tumor focally present at the anterior margin, 1/7 lymph nodes positive, ER/PR positive, HER-2 negative by IHC. -PET/CT scan on 12/01/2018 shows hypermetabolic left breast lesion associated with hypermetabolic left axillary lymphadenopathy.  No other metastatic disease was seen. - Mastectomy wound is healing well.  We talked about the pathology report in detail. - As she had a high-grade lymph node positive left breast cancer, I have recommended adjuvant chemotherapy. - I have recommended dose dense AC followed by weekly Taxol x12.  This will be followed by antiestrogen therapy.  We will also follow-up on the HER-2 FISH testing on the biopsy. -She will need a port placement.  She will also have a baseline echocardiogram. -I will see her back prior to initiation of therapy.  2.  Family history: -Sister reportedly died of metastatic breast cancer at age 79. -I would recommend genetic testing.

## 2019-01-19 NOTE — Patient Instructions (Addendum)
[place] at [facility] Discharge Instructions  You were seen today by [name]. He went over your history, family history and how you've been feeling lately. He would like you to have a port placed as well as have an ECHO done. He will see you back in 2 weeks for labs, treatment and follow up.   Thank you for choosing [place] at [facility] to provide your oncology and hematology care.  To afford each patient quality time with our provider, please arrive at least 15 minutes before your scheduled appointment time.   If you have a lab appointment with the [facility] please come in thru the  Main Entrance and check in at the main information desk  You need to re-schedule your appointment should you arrive 10 or more minutes late.  We strive to give you quality time with our providers, and arriving late affects you and other patients whose appointments are after yours.  Also, if you no show three or more times for appointments you may be dismissed from the clinic at the providers discretion.     Again, thank you for choosing [facility].  Our hope is that these requests will decrease the amount of time that you wait before being seen by our physicians.       _____________________________________________________________  Should you have questions after your visit to [facility], please contact our office at [phone] between the hours of 8:00 a.m. and 4:30 p.m.  Voicemails left after 4:00 p.m. will not be returned until the following business day.  For prescription refill requests, have your pharmacy contact our office and allow 72 hours.    Cancer Center Support Programs:   > Cancer Support Group  2nd Tuesday of the month 1pm-2pm, Journey Room

## 2019-01-19 NOTE — Progress Notes (Signed)
[facility] NOTE  Patient Care Team: [name], MD as PCP - General (Family Medicine)  CHIEF COMPLAINTS/PURPOSE OF CONSULTATION:  Newly diagnosed left breast cancer  HISTORY OF PRESENTING ILLNESS:  [name] 63 y.o. female is seen in consultation today for further work-up and management of newly diagnosed left breast cancer.  She reports feeling a lump in her left breast in February of this year.  She underwent mammogram on 10/25/2018 which showed a 2.5 x 2.2 x 2 cm mass along with an enlarged left axillary lymph node.  Biopsy was consistent with invasive ductal carcinoma, grade 3.  She also had a right breast mass palpable.  This was biopsied on 12/02/2018 which was fibrocystic change.  A PET CT scan on 12/01/2018 did not show any evidence of metastatic disease.  She was seen by Dr. Arnoldo Morale who did a left modified radical mastectomy on 01/04/2019.  She has recovered well from surgery. 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  She quit smoking in 1983.  She smoked very light prior to that.  Denies any underlying neuropathy.  She has hypertension,: High cholesterol and diabetes.  Family history significant for sister diagnosed with metastatic breast cancer at age 81.  I reviewed her records extensively and collaborated the history with the patient.  SUMMARY OF ONCOLOGIC HISTORY:   Malignant neoplasm of upper-outer quadrant of left breast in female, estrogen receptor positive ([place])   01/04/2019 Initial Diagnosis    Malignant neoplasm of upper-outer quadrant of left breast in female, estrogen receptor positive ([place])    01/19/2019 Cancer Staging    Staging form: Breast, AJCC 8th Edition - Pathologic stage from 01/19/2019: Stage IIA (pT2, pN1, cM0, G3, ER+, PR+, HER2-) - Signed by Derek Jack, MD on 01/19/2019     In terms of breast cancer risk profile:  She menarched at early age of 89 and went to menopause in her late 53s. She had 2  pregnancies, her first child was born at age 48 She  received birth control pills for approximately 6 years.  She was never exposed to fertility medications or hormone replacement therapy.  She has positive family history of Breast/GYN/GI cancer  MEDICAL HISTORY:  Past Medical History:  Diagnosis Date  . Coronary artery disease   . Diabetes mellitus without complication ([place])   . Hypertension     SURGICAL HISTORY: Past Surgical History:  Procedure Laterality Date  . ANKLE SURGERY Right 2009  . MASTECTOMY MODIFIED RADICAL Left 01/04/2019   Procedure: MASTECTOMY MODIFIED RADICAL;  Surgeon: Aviva Signs, MD;  Location: [facility];  Service: General;  Laterality: Left;  . WRIST SURGERY Right 1997    SOCIAL HISTORY: Social History   Socioeconomic History  . Marital status: Divorced    Spouse name: Not on file  . Number of children: Not on file  . Years of education: Not on file  . Highest education level: Not on file  Occupational History  . Not on file  Social Needs  . Financial resource strain: Not on file  . Food insecurity:    Worry: Not on file    Inability: Not on file  . Transportation needs:    Medical: Not on file    Non-medical: Not on file  Tobacco Use  . Smoking status: Former Smoker    Types: Cigarettes    Last attempt to quit: 1983    Years since quitting: 37.4  . Smokeless tobacco: Never Used  Substance and Sexual  Activity  . Alcohol use: Never    Frequency: Never  . Drug use: Never  . Sexual activity: Not on file  Lifestyle  . Physical activity:    Days per week: Not on file    Minutes per session: Not on file  . Stress: Not on file  Relationships  . Social connections:    Talks on phone: Not on file    Gets together: Not on file    Attends religious service: Not on file    Active member of club or organization: Not on file    Attends meetings of clubs or organizations: Not on file    Relationship status: Not on file  . Intimate partner  violence:    Fear of current or ex partner: Not on file    Emotionally abused: Not on file    Physically abused: Not on file    Forced sexual activity: Not on file  Other Topics Concern  . Not on file  Social History Narrative  . Not on file    FAMILY HISTORY: History reviewed. No pertinent family history.  ALLERGIES:  has No Known Allergies.  MEDICATIONS:  Current Outpatient Medications  Medication Sig Dispense Refill  . atorvastatin (LIPITOR) 40 MG tablet Take 40 mg by mouth at bedtime.     . carvedilol (COREG) 6.25 MG tablet Take 6.25 mg by mouth every morning.     . enalapril (VASOTEC) 20 MG tablet Take 20 mg by mouth 2 (two) times daily.    . ferrous sulfate 325 (65 FE) MG tablet Take 325 mg by mouth daily with breakfast.    . metFORMIN (GLUCOPHAGE) 850 MG tablet Take 850 mg by mouth daily with breakfast.      No current facility-administered medications for this visit.     REVIEW OF SYSTEMS:   Constitutional: Denies fevers, chills or abnormal night sweats Eyes: Denies blurriness of vision, double vision or watery eyes Ears, nose, mouth, throat, and face: Denies mucositis or sore throat Respiratory: Denies cough, dyspnea or wheezes Cardiovascular: Denies palpitation, chest discomfort or lower extremity swelling Gastrointestinal:  Denies nausea, heartburn or change in bowel habits Skin: Denies abnormal skin rashes Lymphatics: Denies new lymphadenopathy or easy bruising Neurological:Denies numbness, tingling or new weaknesses Behavioral/Psych: Mood is stable, no new changes  Breast:  Denies any palpable lumps or discharge All other systems were reviewed with the patient and are negative.  PHYSICAL EXAMINATION: ECOG PERFORMANCE STATUS: 0 - Asymptomatic  Vitals:   01/19/19 1343  BP: (!) 166/7  Pulse: 87  Resp: 18  Temp: 99.2 F (37.3 C)  SpO2: 100%   Filed Weights   01/19/19 1343  Weight: 191 lb 12.8 oz (87 kg)    GENERAL:alert, no distress and  comfortable SKIN: skin color, texture, turgor are normal, no rashes or significant lesions EYES: normal, conjunctiva are pink and non-injected, sclera clear OROPHARYNX:no exudate, no erythema and lips, buccal mucosa, and tongue normal  NECK: supple, thyroid normal size, non-tender, without nodularity LYMPH:  no palpable lymphadenopathy in the cervical, axillary or inguinal LUNGS: clear to auscultation and percussion with normal breathing effort HEART: regular rate & rhythm and no murmurs and no lower extremity edema ABDOMEN:abdomen soft, non-tender and normal bowel sounds Musculoskeletal:no cyanosis of digits and no clubbing  PSYCH: alert & oriented x 3 with fluent speech NEURO: no focal motor/sensory deficits BREAST: Left mastectomy site is healing well.  No suspicious masses.  Right breast has a mass palpable in the upper outer quadrant, which  was biopsy-proven benign lesion.  No palpable adenopathy.  LABORATORY DATA:  I have reviewed the data as listed Lab Results  Component Value Date   WBC 8.1 01/05/2019   HGB 9.6 (L) 01/05/2019   HCT 31.4 (L) 01/05/2019   MCV 91.3 01/05/2019   PLT 231 01/05/2019   Lab Results  Component Value Date   NA 140 01/05/2019   K 4.0 01/05/2019   CL 105 01/05/2019   CO2 29 01/05/2019    RADIOGRAPHIC STUDIES: I have personally reviewed the radiological reports and agreed with the findings in the report.  ASSESSMENT AND PLAN:  Malignant neoplasm of upper-outer quadrant of left breast in female, estrogen receptor positive ([place]) 1.  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  Mammogram on 10/25/2018 shows left breast 1 o'clock position, 2.5 x 2 point 2 x 2 centimeter mass with enlarged lymph node in the left axilla measuring 1.8 x 1.6 x 1.4 cm. - Biopsy on 11/01/2018 shows IDC, grade 3, ER/PR positive, Ki-67 70%, HER-2 2+ - Right breast needle biopsy on 12/02/2018 with benign breast parenchyma with  fibrocystic change. - Left MRM on 01/04/2019, 3.2 cm IDC, grade 3, tumor focally present at the anterior margin, 1/7 lymph nodes positive, ER/PR positive, HER-2 negative by IHC. -PET/CT scan on 12/01/2018 shows hypermetabolic left breast lesion associated with hypermetabolic left axillary lymphadenopathy.  No other metastatic disease was seen. - Mastectomy wound is healing well.  We talked about the pathology report in detail. - As she had a high-grade lymph node positive left breast cancer, I have recommended adjuvant chemotherapy. - I have recommended dose dense AC followed by weekly Taxol x12.  This will be followed by antiestrogen therapy.  We will also follow-up on the HER-2 FISH testing on the biopsy. -She will need a port placement.  She will also have a baseline echocardiogram. -I will see her back prior to initiation of therapy.  2.  Family history: -Sister reportedly died of metastatic breast cancer at age 20. -I would recommend genetic testing.   All questions were answered. The patient knows to call the clinic with any problems, questions or concerns.    Derek Jack, MD 01/19/19

## 2019-01-20 ENCOUNTER — Encounter (HOSPITAL_COMMUNITY): Payer: Self-pay | Admitting: *Deleted

## 2019-01-20 NOTE — Progress Notes (Signed)
01/19/19 Oncology Navigator note:  I met with patient following the visit with [name]. I provided her with written material on the three chemotherapy medications that patient was presented today by [name].  I explained the general treatment plan.  I provided her with my contact information and advised for her to call should she need any thing.  She verbalizes appreciation and understanding.

## 2019-01-23 NOTE — Patient Instructions (Addendum)
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  You will be treated every 2 weeks x 4 cycles with doxorubicin (Adriamycin) and cyclophosphamide (Cytoxan).  Upon completion of those 4 cycles we will then start you on weekly paclitaxel (Taxol) x 12 weeks.  The intent of this treatment is to cure your cancer.  You will see the doctor regularly throughout treatment.  We monitor your lab work prior to every treatment. The doctor monitors your response to treatment by the way you are feeling, your blood work, and scans periodically.  There will be wait times while you are here for treatment.  It will take about 30 minutes to 1 hour for your lab work to result.  Then there will be wait times while pharmacy mixes your medications.   Medications you will receive in the clinic prior to your Adriamycin and Cytoxan:  Aloxi:  ALOXI is a medicine called an "antiemetic."   ALOXI is used in adults to help prevent the  nausea and vomiting that happens with certain anti-cancer medicines (chemotherapy).  Aloxi is a long acting medication, and will remain in your system for 24-36 hours.   Emend:  This is an anti-nausea medication that is used with Aloxi to help prevent nausea and vomiting caused by chemotherapy.  Dexamethasone:  This is a steroid given prior to chemotherapy to help prevent allergic reactions; it may also help prevent and control nausea and diarrhea.     Medications you will receive in the clinic prior to your Taxol:  Aloxi:  ALOXI is used in adults to help prevent the nausea and vomiting that happens with certain chemotherapies.  Aloxi is a long acting medication, and will remain in your system for 24-36 hours.   Pepcid:  This medication is a histamine blocker that helps prevent and allergic reaction to your chemotherapy.   Dexamethasone:  This is a steroid given prior to chemotherapy to help prevent allergic  reactions; it may also help prevent and control nausea and diarrhea.   Benadryl:  This is a histamine blocker (different from the Pepcid) that helps prevent allergic/infusion reactions to your chemotherapy. This medication may cause dizziness/drowsiness.   Udenyca/Neulasta - this medication is not chemotherapy but being given because you  chemotherapy. It is usually given 24-72 hours after the completion of chemotherapy. This medication works by boosting your bone marrow's supply of white blood cells. White blood cells are what protect our bodies against infection. The medication is given in the form of a subcutaneous injection. It is given in the fatty tissue of your abdomen or in the skin in the back of your arm. It is a short needle. The major side effect of this medication is bone or muscle pain. The drug of choice to relieve or lessen the pain is Aleve or Ibuprofen. If a physician has ever told you not to take Aleve or Ibuprofen - then don't take it. You should then take Tylenol/acetaminophen. Take either medication as the bottle directs you to.  The level of pain you experience as a result of this injection can range from none, to mild or moderate, or severe. Please let us know if you develop moderate or severe bone pain.   You can take Claritin 10 mg over the counter for a few days after receiving neulasta to help with the bone aches and pains.  Cyclophosphamide (Generic Name) Other Names: Cytoxan, Neosar  About This  Drug Cyclophosphamide is a drug used to treat cancer. It is given in the vein (IV) or by mouth.  Takes 30 minutes for this drug to infuse.  Possible Side Effects (More Common) . Nausea and throwing up (vomiting). These symptoms may happen within a few hours after your treatment and may last up to 72 hours. Medicines are available to stop or lessen these side effects. . Bone marrow depression. This is a decrease in the number of white blood cells, red blood cells, and platelets.  This may raise your risk of infection, make you tired and weak (fatigue), and raise your risk of bleeding. . Hair loss: You may notice hair getting thin. Some patients lose their hair. Hair loss is often complete scalp hair loss and can involve loss of eyebrows, eyelashes, and pubic hair. You may notice this a few days or weeks after treatment has started. Most often hair loss is temporary; your hair should grow back when treatment is done. . Decreased appetite (decreased hunger) . Blurred vision . Soreness of the mouth and throat. You may have red areas, white patches, or sores that hurt. . Effects on the bladder. This drug may cause irritation and bleeding in the bladder. You may have blood in your urine. To help stop this, you will get extra fluids to help you pass more urine. You may get a drug called mesna, which helps to decrease irritation and bleeding. You may also get a medicine to help you pass more urine. You may have a catheter (tube) placed in your bladder so that your bladder will be washed with this drug.  Possible Side Effects (Less Common) . Darkening of the skin or nails . Metallic taste in the mouth . Changes in lung tissue may happen with large amounts of this drug. These changes may not last forever, and your lung tissue may go back to normal. Sometimes these changes may not be seen for many years. You may get a cough or have trouble catching your breath.  Allergic Reactions   Serious allergic reactions including anaphylaxis are rare. While you are getting this drug in your vein (IV), tell your nurse right away if you have any of these symptoms of an allergic reaction: . Trouble catching your breath . Feeling like your tongue or throat are swelling . Feeling your heart beat quickly or in a not normal way (palpitations) . Feeling dizzy or lightheaded . Flushing, itching, rash, and/or hives  Treating Side Effects . Drink 6-8 cups of fluids each day unless your doctor has told  you to limit your fluid intake due to some other health problem. A cup is 8 ounces of fluid. If you throw up or have loose bowel movements you should drink more fluids so that you do not become dehydrated (lack water in the body due to losing too much fluid). . Ask your doctor or nurse about medicine that is available to help stop or lessen nausea or throwing up. . Mouth care is very important. Your mouth care should consist of routine, gentle cleaning of your teeth or dentures and rinsing your mouth with a mixture of 1/2 teaspoon of salt in 8 ounces of water or  teaspoon of baking soda in 8 ounces of water. This should be done at least after each meal and at bedtime. . If you have mouth sores, avoid mouthwash that has alcohol. Also avoid alcohol and smoking because they can bother your mouth and throat. . Talk with your nurse about  getting a wig before you lose your hair. Also, call the [place] at 800-ACS-2345 to find out information about the " Look Good.Marland KitchenMarland KitchenFeel Better" program close to where you live. It is a free program where women undergoing chemotherapy learn about wigs, turbans and scarves as well as makeup techniques and skin and nail care.  Important Information . Whenever you tell a doctor or nurse your health history, always tell them that you have received cyclophosphamide in the past. . If you take this drug by mouth swallow the medicine whole. Do not chew, break or crush it. . You can take the medicine with or without food. If you have nausea, take it with food. Do not take the pills at bedtime.  Food and Drug Interactions There are no known interactions of cyclophosphamide with food. This drug may interact with other medicines. Tell your doctor and pharmacist about all the medicines and dietary supplements (vitamins, minerals, herbs and others) that you are taking at this time. The safety and use of dietary supplements and alternative diets are often not known. Using  these might affect your cancer or interfere with your treatment. Until more is known, you should not use dietary supplements or alternative diets without your cancer doctor's help.  When to Call the Doctor  Call your doctor or nurse right away if you have any of these symptoms:  . Fever of 100.5 F (38 C) or higher . Chills . Bleeding or bruising that is not normal . Blurred vision or other changes in eyesight . Pain when passing urine; blood in urine . Pain in your lower back or side . Wheezing or trouble breathing . Swelling of legs, ankles, or feet . Feeling dizzy or lightheaded . Feeling confused or agitated . Signs of liver problems: dark urine, pale bowel movements, bad stomach pain, feeling very tired and weak, unusual itching, or yellowing of the eyes or skin . Unusual thirst or passing urine often . Nausea that stops you from eating or drinking . Throwing up more than 3 times a day  Call your doctor or nurse as soon as possible if any of these symptoms happen: . Pain in your mouth or throat that makes it hard to eat or drink . Nausea not relieved by prescribed medicines  Sexual Problems and Reproductive Concerns  . Infertility warning: Sexual problems and reproduction concerns may happen. In both men and women, this drug may affect your ability to have children. This cannot be determined before your treatment. Talk with your doctor or nurse if you plan to have children. Ask for information on sperm or egg banking. . In men, this drug may interfere with your ability to make sperm, but it should not change your ability to have sexual relations. . In women, menstrual bleeding may become irregular or stop while you are getting this drug. Do not assume that you cannot become pregnant if you do not have a menstrual period. . Women may go through signs of menopause (change of life) like vaginal dryness or itching. Vaginal lubricants can be used to lessen vaginal dryness, itching, and  pain during sexual relations. . Genetic counseling is available for you to talk about the effects of this drug therapy on future pregnancies. Also, a genetic counselor can look at the possible risk of problems in the unborn baby due to this medicine if an exposure happens during pregnancy. . Pregnancy warning: This drug may have harmful effects on the unborn child, so effective methods of  birth control should be used during your cancer treatment. . Breast feeding warning: Women should not breast feed during treatment because this drug could enter the breast milk and badly harm a breast feeding baby  Doxorubicin (Generic Name) Other Names: Adriamycin, hydroxyl daunorubicin  About This Drug Doxorubicin is a drug used to treat cancer. This drug is given in the vein (IV).  This drug is an IV push over about 10 minutes.    Possible Side Effects (More Common) . Bone marrow depression. This is a decrease in the number of white blood cells, red blood cells, and platelets. This may raise your risk of infection, make you tired and weak (fatigue), and raise your risk of bleeding. . Hair loss: Hair loss is often complete scalp hair loss and can involve loss of eyebrows, eyelashes, and pubic hair. You may notice this a few days or weeks after treatment has started. Most often hair loss is temporary; your hair should grow back when treatment is done. . Nausea and throwing up (vomiting). These symptoms may happen within a few hours after your treatment and may last up to 24 hours. Medicines are available to stop or lessen these side effects. . Soreness of the mouth and throat. You may have red areas, white patches, or sores that hurt. . Change in the color of your urine to pink or red. This color change will go away in one to two days. . Effects on the heart: This drug can weaken the heart and lower heart function. Your heart function will be checked as needed. You may have trouble catching your breath, mainly  during activities. You may also have trouble breathing while lying down, and have swelling in your ankles. . Sensitivity to light (photosensitivity). Photosensitivity means that you may become more sensitive to the effects of the sun, sun lamps, and tanning beds. Your eyes may water more, mostly in bright light. . Metallic taste in the mouth: This may change the taste of food and drinks . Decreased appetite (decreased hunger) . Darkening of the skin or nails . Weakness that interferes with your daily activities  Possible Side Effects (Less Common) . Skin and tissue irritation may involve redness, pain, warmth, or swelling at the IV site. This happens if the drug leaks out of the vein and into nearby tissue. . Changes in your liver function. Your doctor will check your liver function as needed. . This drug may cause an increased risk of developing a second cancer  Allergic Reaction Serious allergic reactions, including anaphylaxis are rare. While you are getting this drug in your vein (IV), tell your nurse right away if you have any of these symptoms of an allergic reaction: . Trouble catching your breath . Feeling like your tongue or throat are swelling . Feeling your heart beat quickly or in a not normal way (palpitations) . Feeling dizzy or lightheaded . Flushing, itching, rash, and/or hives  Treating Side Effects . Drink 6-8 cups of fluids every day unless your doctor has told you to limit your fluid intake due to some other health problem. A cup is 8 ounces of fluid. If you vomit or have diarrhea, you should drink more fluids so that you do not become dehydrated (lack water in the body due to losing too much fluid). . Ask your doctor or nurse about medicine that is available to help stop or lessen nausea, throwing up, and/or loose bowel movements . Wear dark sunglasses and use sunscreen with SPF 30 or  higher when you are outdoors even for a short time. Cover up when you are out in the sun.  Wear wide-brimmed hats, long-sleeved shirts, and pants. Keep your neck, chest, and back covered. . Mouth care is very important. Your mouth care should consist of routine, gentle cleaning of your teeth or dentures and rinsing your mouth with a mixture of 1/2 teaspoon of salt in 8 ounces of water or  teaspoon of baking soda in 8 ounces of water. This should be done at least after each meal and at bedtime. . If you have mouth sores, avoid mouthwash that has alcohol. Avoid alcohol and smoking because they can bother your mouth and throat. . Talk with your nurse about getting a wig before you lose your hair. Also, call the [place] at 800-ACS-2345 to find out information about the "Look Good, Feel Better" program close to where you live. It is a free program where women getting chemotherapy can learn about wigs, turbans and scarves as well as makeup techniques and skin and nail care. . While you are getting this drug, please tell your nurse right away if you have any pain, redness, or swelling at the site of the IV infusion.  Food and Drug Interactions There are no known interactions of doxorubicin with food. This drug may interact with other medicines. Tell your doctor and pharmacist about all the medicines and dietary supplements (vitamins, minerals, herbs and others) that you are taking at this time. The safety and use of dietary supplements and alternative diets are often not known. Using these might affect your cancer or interfere with your treatment. Until more is known, you should not use dietary supplements or alternative diets without your cancer doctor's help.  When to Call the Doctor Call your doctor or nurse right away if you have any of these symptoms: . Fever of 100.5 F (38 C) or above . Chills . Easy bruising or bleeding . Wheezing or trouble breathing . Rash or itching . Feeling dizzy or lightheaded . Feeling that your heart is beating in a fast or not normal way  (palpitations) . Loose bowel movements (diarrhea) more than 4 times a day or diarrhea with weakness or feeling lightheaded . Nausea that stops you from eating or drinking . Throwing up more than 3 times a day . Signs of liver problems: dark urine, pale bowel movements, bad stomach pain, feeling very tired and weak, unusual itching, or yellowing of the eyes or skin, . During the IV infusion, if you have pain, redness, or swelling at the site of the IV infusion, please tell your nurse right away  Call your doctor or nurse as soon as possible if any of these symptoms happen: . Decreased urine . Pain in your mouth or throat that makes it hard to eat or drink . Nausea and throwing up that is not relieved by prescribed medicines . Rash that is not relieved by prescribed medicines . Swelling of legs, ankles, or feet . Weight gain of 5 pounds in one week (fluid retention) . Lasting loss of appetite or rapid weight loss of five pounds in a week . Fatigue that interferes with your daily activities . Extreme weakness that interferes with normal activities  Sexual Problems and Reproduction Concerns . Infertility warning: Sexual problems and reproduction concerns may happen. In both men and women, this drug may affect your ability to have children. This cannot be determined before your treatment. Talk with your doctor or nurse if you  plan to have children. Ask for information on sperm or egg banking. . In men, this drug may interfere with your ability to make sperm, but it should not change your ability to have sexual relations. . In women, menstrual bleeding may become irregular or stop while you are getting this drug. Do not assume that you cannot become pregnant if you do not have a menstrual period.   Paclitaxel (Taxol)  About This Drug Paclitaxel is a drug used to treat cancer. It is given in the vein (IV).  This will take 1 hour to infuse.  This first infusion will take longer to infuse because  it is increased slowly to monitor for reactions.  The nurse will be in the room with you for the first 15 minutes of the first infusion.  Possible Side Effects . Hair loss. Hair loss is often temporary, although with certain medicine, hair loss can sometimes be permanent. Hair loss may happen suddenly or gradually. If you lose hair, you may lose it from your head, face, armpits, pubic area, chest, and/or legs. You may also notice your hair getting thin. . Swelling of your legs, ankles and/or feet (edema) . Flushing . Nausea and throwing up (vomiting) . Loose bowel movements (diarrhea) . Bone marrow depression. This is a decrease in the number of white blood cells, red blood cells, and platelets. This may raise your risk of infection, make you tired and weak (fatigue), and raise your risk of bleeding. . Effects on the nerves are called peripheral neuropathy. You may feel numbness, tingling, or pain in your hands and feet. It may be hard for you to button your clothes, open jars, or walk as usual. The effect on the nerves may get worse with more doses of the drug. These effects get better in some people after the drug is stopped but it does not get better in all people. . Changes in your liver function . Bone, joint and muscle pain . Abnormal EKG . Allergic reaction: Allergic reactions, including anaphylaxis are rare but may happen in some patients. Signs of allergic reaction to this drug may be swelling of the face, feeling like your tongue or throat are swelling, trouble breathing, rash, itching, fever, chills, feeling dizzy, and/or feeling that your heart is beating in a fast or not normal way. If this happens, do not take another dose of this drug. You should get urgent medical treatment. . Infection . Changes in your kidney function. Note: Each of the side effects above was reported in 20% or greater of patients treated with paclitaxel. Not all possible side effects are included above.  Warnings  and Precautions . Severe allergic reactions . Severe bone marrow depression  Treating Side Effects . To help with hair loss, wash with a mild shampoo and avoid washing your hair every day. . Avoid rubbing your scalp, instead, pat your hair or scalp dry . Avoid coloring your hair . Limit your use of hair spray, electric curlers, blow dryers, and curling irons. . If you are interested in getting a wig, talk to your nurse. You can also call the [place] at 800-ACS-2345 to find out information about the "Look Good, Feel Better" program close to where you live. It is a free program where women getting chemotherapy can learn about wigs, turbans and scarves as well as makeup techniques and skin and nail care. . Ask your doctor or nurse about medicines that are available to help stop or lessen diarrhea and/or nausea. Marland Kitchen  To help with nausea and vomiting, eat small, frequent meals instead of three large meals a day. Choose foods and drinks that are at room temperature. Ask your nurse or doctor about other helpful tips and medicine that is available to help or stop lessen these symptoms. . If you get diarrhea, eat low-fiber foods that are high in protein and calories and avoid foods that can irritate your digestive tracts or lead to cramping. Ask your nurse or doctor about medicine that can lessen or stop your diarrhea. . Mouth care is very important. Your mouth care should consist of routine, gentle cleaning of your teeth or dentures and rinsing your mouth with a mixture of 1/2 teaspoon of salt in 8 ounces of water or  teaspoon of baking soda in 8 ounces of water. This should be done at least after each meal and at bedtime. . If you have mouth sores, avoid mouthwash that has alcohol. Also avoid alcohol and smoking because they can bother your mouth and throat. . Drink plenty of fluids (a minimum of eight glasses per day is recommended). . Take your temperature as your doctor or nurse tells you,  and whenever you feel like you may have a fever. . Talk to your doctor or nurse about precautions you can take to avoid infections and bleeding. . Be careful when cooking, walking, and handling sharp objects and hot liquids.  Food and Drug Interactions . There are no known interactions of paclitaxel with food. . This drug may interact with other medicines. Tell your doctor and pharmacist about all the medicines and dietary supplements (vitamins, minerals, herbs and others) that you are taking at this time. . The safety and use of dietary supplements and alternative diets are often not known. Using these might affect your cancer or interfere with your treatment. Until more is known, you should not use dietary supplements or alternative diets without your cancer doctor's help.  When to Call the Doctor Call your doctor or nurse if you have any of the following symptoms and/or any new or unusual symptoms: . Fever of 100.5 F (38 C) or above . Chills . Redness, pain, warmth, or swelling at the IV site during the infusion . Signs of allergic reaction: swelling of the face, feeling like your tongue or throat are swelling, trouble breathing, rash, itching, fever, chills, feeling dizzy, and/or feeling that your heart is beating in a fast or not normal way . Feeling that your heart is beating in a fast or not normal way (palpitations) . Weight gain of 5 pounds in one week (fluid retention) . Decreased urine or very dark urine . Signs of liver problems: dark urine, pale bowel movements, bad stomach pain, feeling very tired and weak, unusual itching, or yellowing of the eyes or skin . Heavy menstrual period that lasts longer than normal . Easy bruising or bleeding . Nausea that stops you from eating or drinking, and/or that is not relieved by prescribed medicines. . Loose bowel movements (diarrhea) more than 4 times a day or diarrhea with weakness or lightheadedness . Pain in your mouth or throat that  makes it hard to eat or drink . Lasting loss of appetite or rapid weight loss of five pounds in a week . Signs of peripheral neuropathy: numbness, tingling, or decreased feeling in fingers or toes; trouble walking or changes in the way you walk; or feeling clumsy when buttoning clothes, opening jars, or other routine activities . Joint and muscle pain that is not  relieved by prescribed medicines . Extreme fatigue that interferes with normal activities . While you are getting this drug, please tell your nurse right away if you have any pain, redness, or swelling at the site of the IV infusion. . If you think you are pregnant.  Reproduction Warnings  . Pregnancy warning: This drug may have harmful effects on the unborn child, it is recommended that effective methods of birth control should be used during your cancer treatment. Let your doctor know right away if you think you may be pregnant. . Breast feeding warning: Women should not breast feed during treatment because this drug could enter the breastmilk and cause harm to a breast feeding baby.  SELF CARE ACTIVITIES WHILE ON CHEMOTHERAPY:  Hydration Increase your fluid intake 48 hours prior to treatment and drink at least 8 to 12 cups (64 ounces) of water/decaffeinated beverages per day after treatment. You can still have your cup of coffee or soda but these beverages do not count as part of your 8 to 12 cups that you need to drink daily. No alcohol intake.  Medications Continue taking your normal prescription medication as prescribed.  If you start any new herbal or new supplements please let us know first to make sure it is safe.  Mouth Care Have teeth cleaned professionally before starting treatment. Keep dentures and partial plates clean. Use soft toothbrush and do not use mouthwashes that contain alcohol. Biotene is a good mouthwash that is available at most pharmacies or may be ordered by calling [phone]. Use warm salt water  gargles (1 teaspoon salt per 1 quart warm water) before and after meals and at bedtime. Or you may rinse with 2 tablespoons of three-percent hydrogen peroxide mixed in eight ounces of water. If you are still having problems with your mouth or sores in your mouth please call the clinic. If you need dental work, please let the doctor know before you go for your appointment so that we can coordinate the best possible time for you in regards to your chemo regimen. You need to also let your dentist know that you are actively taking chemo. We may need to do labs prior to your dental appointment.  Skin Care Always use sunscreen that has not expired and with SPF (Sun Protection Factor) of 50 or higher. Wear hats to protect your head from the sun. Remember to use sunscreen on your hands, ears, face, & feet.  Use good moisturizing lotions such as udder cream, eucerin, or even Vaseline. Some chemotherapies can cause dry skin, color changes in your skin and nails.    . Avoid long, hot showers or baths. . Use gentle, fragrance-free soaps and laundry detergent. . Use moisturizers, preferably creams or ointments rather than lotions because the thicker consistency is better at preventing skin dehydration. Apply the cream or ointment within 15 minutes of showering. Reapply moisturizer at night, and moisturize your hands every time after you wash them.  Hair Loss (if your doctor says your hair will fall out)  . If your doctor says that your hair is likely to fall out, decide before you begin chemo whether you want to wear a wig. You may want to shop before treatment to match your hair color. . Hats, turbans, and scarves can also camouflage hair loss, although some people prefer to leave their heads uncovered. If you go bare-headed outdoors, be sure to use sunscreen on your scalp. . Cut your hair short. It eases the inconvenience of shedding lots  of hair, but it also can reduce the emotional impact of watching your hair  fall out. . Don't perm or color your hair during chemotherapy. Those chemical treatments are already damaging to hair and can enhance hair loss. Once your chemo treatments are done and your hair has grown back, it's OK to resume dyeing or perming hair.  With chemotherapy, hair loss is almost always temporary. But when it grows back, it may be a different color or texture. In older adults who still had hair color before chemotherapy, the new growth may be completely gray.  Often, new hair is very fine and soft.  Infection Prevention Please wash your hands for at least 30 seconds using warm soapy water. Handwashing is the #1 way to prevent the spread of germs. Stay away from sick people or people who are getting over a cold. If you develop respiratory systems such as green/yellow mucus production or productive cough or persistent cough let us know and we will see if you need an antibiotic. It is a good idea to keep a pair of gloves on when going into grocery stores/[employer] to decrease your risk of coming into contact with germs on the carts, etc. Carry alcohol hand gel with you at all times and use it frequently if out in public. If your temperature reaches 100.5 or higher please call the clinic and let us know.  If it is after hours or on the weekend please go to the ER if your temperature is over 100.5.  Please have your own personal thermometer at home to use.    Sex and bodily fluids If you are going to have sex, a condom must be used to protect the person that isn't taking chemotherapy. Chemo can decrease your libido (sex drive). For a few days after chemotherapy, chemotherapy can be excreted through your bodily fluids.  When using the toilet please close the lid and flush the toilet twice.  Do this for a few day after you have had chemotherapy.   Effects of chemotherapy on your sex life Some changes are simple and won't last long. They won't affect your sex life permanently.  Sometimes you may  feel: . too tired . not strong enough to be very active . sick or sore  . not in the mood . anxious or low Your anxiety might not seem related to sex. For example, you may be worried about the cancer and how your treatment is going. Or you may be worried about money, or about how you family are coping with your illness. These things can cause stress, which can affect your interest in sex. It's important to talk to your partner about how you feel. Remember - the changes to your sex life don't usually last long. There's usually no medical reason to stop having sex during chemo. The drugs won't have any long term physical effects on your performance or enjoyment of sex. Cancer can't be passed on to your partner during sex  Contraception It's important to use reliable contraception during treatment. Avoid getting pregnant while you or your partner are having chemotherapy. This is because the drugs may harm the baby. Sometimes chemotherapy drugs can leave a man or woman infertile.  This means you would not be able to have children in the future. You might want to talk to someone about permanent infertility. It can be very difficult to learn that you may no longer be able to have children. Some people find counselling helpful. There might be  ways to preserve your fertility, although this is easier for men than for women. You may want to speak to a fertility expert. You can talk about sperm banking or harvesting your eggs. You can also ask about other fertility options, such as donor eggs. If you have or have had breast cancer, your doctor might advise you not to take the contraceptive pill. This is because the hormones in it might affect the cancer.  It is not known for sure whether or not chemotherapy drugs can be passed on through semen or secretions from the vagina. Because of this some doctors advise people to use a barrier method if you have sex during treatment. This applies to vaginal, anal or oral  sex. Generally, doctors advise a barrier method only for the time you are actually having the treatment and for about a week after your treatment. Advice like this can be worrying, but this does not mean that you have to avoid being intimate with your partner. You can still have close contact with your partner and continue to enjoy sex.  Animals If you have cats or birds we just ask that you not change the litter or change the cage.  Please have someone else do this for you while you are on chemotherapy.   Food Safety During and After Cancer Treatment Food safety is important for people both during and after cancer treatment. Cancer and cancer treatments, such as chemotherapy, radiation therapy, and stem cell/bone marrow transplantation, often weaken the immune system. This makes it harder for your body to protect itself from foodborne illness, also called food poisoning. Foodborne illness is caused by eating food that contains harmful bacteria, parasites, or viruses.  Foods to avoid Some foods have a higher risk of becoming tainted with bacteria. These include: Marland Kitchen Unwashed fresh fruit and vegetables, especially leafy vegetables that can hide dirt and other contaminants . Raw sprouts, such as alfalfa sprouts . Raw or undercooked beef, especially ground beef, or other raw or undercooked meat and poultry . Fatty, fried, or spicy foods immediately before or after treatment.  These can sit heavy on your stomach and make you feel nauseous. . Raw or undercooked shellfish, such as oysters. . Sushi and sashimi, which often contain raw fish.  . Unpasteurized beverages, such as unpasteurized fruit juices, raw milk, raw yogurt, or cider . Undercooked eggs, such as soft boiled, over easy, and poached; raw, unpasteurized eggs; or foods made with raw egg, such as homemade raw cookie dough and homemade mayonnaise  Simple steps for food safety  Shop smart. . Do not buy food stored or displayed in an unclean  area. . Do not buy bruised or damaged fruits or vegetables. . Do not buy cans that have cracks, dents, or bulges. . Pick up foods that can spoil at the end of your shopping trip and store them in a cooler on the way home.  Prepare and clean up foods carefully. . Rinse all fresh fruits and vegetables under running water, and dry them with a clean towel or paper towel. . Clean the top of cans before opening them. . After preparing food, wash your hands for 20 seconds with hot water and soap. Pay special attention to areas between fingers and under nails. . Clean your utensils and dishes with hot water and soap. Marland Kitchen Disinfect your kitchen and cutting boards using 1 teaspoon of liquid, unscented bleach mixed into 1 quart of water.    Dispose of old food. . Eat canned  and packaged food before its expiration date (the "use by" or "best before" date). . Consume refrigerated leftovers within 3 to 4 days. After that time, throw out the food. Even if the food does not smell or look spoiled, it still may be unsafe. Some bacteria, such as Listeria, can grow even on foods stored in the refrigerator if they are kept for too long.  Take precautions when eating out. . At restaurants, avoid buffets and salad bars where food sits out for a long time and comes in contact with many people. Food can become contaminated when someone with a virus, often a norovirus, or another "bug" handles it. . Put any leftover food in a "to-go" container yourself, rather than having the server do it. And, refrigerate leftovers as soon as you get home. . Choose restaurants that are clean and that are willing to prepare your food as you order it cooked.   MEDICATIONS:                                                                                                                                                                Compazine/Prochlorperazine 10mg  tablet. Take 1 tablet every 6 hours as needed for nausea/vomiting. (This can  make you sleepy)   EMLA cream. Apply a quarter size amount to port site 1 hour prior to chemo. Do not rub in. Cover with plastic wrap.   Over-the-Counter Meds:  Colace - 100 mg capsules - take 2 capsules daily.  If this doesn't help then you can increase to 2 capsules twice daily.  Call us if this does not help your bowels move.   Imodium 2mg  capsule. Take 2 capsules after the 1st loose stool and then 1 capsule every 2 hours until you go a total of 12 hours without having a loose stool. Call the [facility] if loose stools continue. If diarrhea occurs at bedtime, take 2 capsules at bedtime. Then take 2 capsules every 4 hours until morning. Call [facility].    Diarrhea Sheet   If you are having loose stools/diarrhea, please purchase Imodium and begin taking as outlined:  At the first sign of poorly formed or loose stools you should begin taking Imodium (loperamide) 2 mg capsules.  Take two tablets (4mg ) followed by one tablet (2mg ) every 2 hours - DO NOT EXCEED 8 tablets in 24 hours.  If it is bedtime and you are having loose stools, take 2 tablets at bedtime, then 2 tablets every 4 hours until morning.   Always call the [facility] if you are having loose stools/diarrhea that you can't get under control.  Loose stools/diarrhea leads to dehydration (loss of water) in your body.  We have other options of trying to get the loose stools/diarrhea to stop but you  must let us know!   Constipation Sheet  Colace - 100 mg capsules - take 2 capsules daily.  If this doesn't help then you can increase to 2 capsules twice daily.  Please call if the above does not work for you.   Do not go more than 2 days without a bowel movement.  It is very important that you do not become constipated.  It will make you feel sick to your stomach (nausea) and can cause abdominal pain and vomiting.   Nausea Sheet   Compazine/Prochlorperazine 10mg  tablet. Take 1 tablet every 6 hours as needed for  nausea/vomiting. (This can make you sleepy)  If you are having persistent nausea (nausea that does not stop) please call the [facility] and let us know the amount of nausea that you are experiencing.  If you begin to vomit, you need to call the [facility] and if it is the weekend and you have vomited more than one time and can't get it to stop-go to the Emergency Room.  Persistent nausea/vomiting can lead to dehydration (loss of fluid in your body) and will make you feel terrible.   Ice chips, sips of clear liquids, foods that are @ room temperature, crackers, and toast tend to be better tolerated.   SYMPTOMS TO REPORT AS SOON AS POSSIBLE AFTER TREATMENT:   FEVER GREATER THAN 100.5 F  CHILLS WITH OR WITHOUT FEVER  NAUSEA AND VOMITING THAT IS NOT CONTROLLED WITH YOUR NAUSEA MEDICATION  UNUSUAL SHORTNESS OF BREATH  UNUSUAL BRUISING OR BLEEDING  TENDERNESS IN MOUTH AND THROAT WITH OR WITHOUT PRESENCE OF ULCERS  URINARY PROBLEMS  BOWEL PROBLEMS  UNUSUAL RASH      Wear comfortable clothing and clothing appropriate for easy access to any Portacath or PICC line. Let us know if there is anything that we can do to make your therapy better!    What to do if you need assistance after hours or on the weekends: CALL [phone].  HOLD on the line, do not hang up.  You will hear multiple messages but at the end you will be connected with a nurse triage line.  They will contact the doctor if necessary.  Most of the time they will be able to assist you.  Do not call the hospital operator.      I have been informed and understand all of the instructions given to me and have received a copy. I have been instructed to call the clinic [phone] or my family physician as soon as possible for continued medical care, if indicated. I do not have any more questions at this time but understand that I may call the [facility] or the Patient Navigator at [phone] during office hours  should I have questions or need assistance in obtaining follow-up care.

## 2019-01-24 NOTE — H&P (Signed)
Bridget Richardson is an 63 y.o. female.   Chief Complaint: Left breast carcinoma HPI: Patient is a 63 year old black female who recently underwent a left modified radical mastectomy and is now referred for Port-A-Cath as she is about to undergo chemotherapy.  She currently has 0 out of 10 pain.  Past Medical History:  Diagnosis Date  . Coronary artery disease   . Diabetes mellitus without complication ([place])   . Hypertension     Past Surgical History:  Procedure Laterality Date  . ANKLE SURGERY Right 2009  . MASTECTOMY MODIFIED RADICAL Left 01/04/2019   Procedure: MASTECTOMY MODIFIED RADICAL;  Surgeon: Aviva Signs, MD;  Location: [facility];  Service: General;  Laterality: Left;  . WRIST SURGERY Right 1997    No family history on file. Social History:  reports that she quit smoking about 37 years ago. Her smoking use included cigarettes. She has never used smokeless tobacco. She reports that she does not drink alcohol or use drugs.  Allergies: No Known Allergies  No medications prior to admission.    No results found for this or any previous visit (from the past 48 hour(s)). No results found.  Review of Systems  All other systems reviewed and are negative.   There were no vitals taken for this visit. Physical Exam  Vitals reviewed. Constitutional: She is oriented to person, place, and time. She appears well-developed and well-nourished. No distress.  HENT:  Head: Normocephalic and atraumatic.  Cardiovascular: Normal rate, regular rhythm and normal heart sounds. Exam reveals no gallop and no friction rub.  No murmur heard. Respiratory: Effort normal and breath sounds normal. No respiratory distress. She has no wheezes. She has no rales.  Neurological: She is alert and oriented to person, place, and time.  Skin: Skin is warm and dry.  Left breast mastectomy incision site healing well.  Assessment/Plan Impression: Left breast carcinoma, status post left modified radical  mastectomy, need for central venous access Plan: Patient will undergo Port-A-Cath insertion on 01/30/2019.  The risks and benefits of the procedure including bleeding, infection, and pneumothorax were fully explained to the patient, who gave informed consent.  Aviva Signs, MD 01/24/2019, 3:05 PM

## 2019-01-25 ENCOUNTER — Other Ambulatory Visit: Payer: Self-pay

## 2019-01-25 ENCOUNTER — Encounter (HOSPITAL_COMMUNITY): Payer: Self-pay

## 2019-01-25 ENCOUNTER — Encounter (HOSPITAL_COMMUNITY)
Admission: RE | Admit: 2019-01-25 | Discharge: 2019-01-25 | Disposition: A | Discharge status: Home / Self Care | Payer: PRIVATE HEALTH INSURANCE | Source: Ambulatory Visit | Attending: General Surgery | Admitting: General Surgery

## 2019-01-26 ENCOUNTER — Inpatient Hospital Stay (HOSPITAL_COMMUNITY): Payer: PRIVATE HEALTH INSURANCE | Attending: Hematology

## 2019-01-26 ENCOUNTER — Other Ambulatory Visit (HOSPITAL_COMMUNITY)
Admission: RE | Admit: 2019-01-26 | Discharge: 2019-01-26 | Disposition: A | Discharge status: Home / Self Care | Payer: PRIVATE HEALTH INSURANCE | Source: Ambulatory Visit | Attending: General Surgery | Admitting: General Surgery

## 2019-01-26 ENCOUNTER — Ambulatory Visit (HOSPITAL_COMMUNITY)
Admission: RE | Admit: 2019-01-26 | Discharge: 2019-01-26 | Disposition: A | Discharge status: Home / Self Care | Payer: PRIVATE HEALTH INSURANCE | Source: Ambulatory Visit | Attending: Hematology | Admitting: Hematology

## 2019-01-26 DIAGNOSIS — Z20828 Contact with and (suspected) exposure to other viral communicable diseases: Secondary | ICD-10-CM | POA: Diagnosis not present

## 2019-01-26 DIAGNOSIS — C50412 Malignant neoplasm of upper-outer quadrant of left female breast: Secondary | ICD-10-CM | POA: Diagnosis present

## 2019-01-26 DIAGNOSIS — Z170 Estrogen receptor positive status [ER+]: Secondary | ICD-10-CM | POA: Diagnosis not present

## 2019-01-26 MED ORDER — LIDOCAINE-PRILOCAINE 2.5-2.5 % EX CREA: TOPICAL_CREAM | CUTANEOUS | 2 refills | Status: AC

## 2019-01-26 MED ORDER — PROCHLORPERAZINE MALEATE 10 MG PO TABS: 10.0000 mg | ORAL_TABLET | Freq: Four times a day (QID) | ORAL | 1 refills | Status: DC | PRN

## 2019-01-26 NOTE — Progress Notes (Signed)
*  PRELIMINARY RESULTS* Echocardiogram 2D Echocardiogram has been performed.  Bridget Richardson 01/26/2019, 4:19 PM

## 2019-01-27 LAB — NOVEL CORONAVIRUS, NAA (HOSP ORDER, SEND-OUT TO REF LAB; TAT 18-24 HRS): SARS-CoV-2, NAA: NOT DETECTED

## 2019-01-30 ENCOUNTER — Encounter (HOSPITAL_COMMUNITY)
Admission: RE | Disposition: A | Discharge status: Home / Self Care | Payer: Self-pay | Source: Home / Self Care | Attending: General Surgery

## 2019-01-30 ENCOUNTER — Ambulatory Visit (HOSPITAL_COMMUNITY): Payer: PRIVATE HEALTH INSURANCE | Admitting: Anesthesiology

## 2019-01-30 ENCOUNTER — Encounter (HOSPITAL_COMMUNITY): Payer: Self-pay

## 2019-01-30 ENCOUNTER — Other Ambulatory Visit: Payer: Self-pay

## 2019-01-30 ENCOUNTER — Ambulatory Visit (HOSPITAL_COMMUNITY): Payer: PRIVATE HEALTH INSURANCE

## 2019-01-30 ENCOUNTER — Ambulatory Visit (HOSPITAL_COMMUNITY)
Admission: RE | Admit: 2019-01-30 | Discharge: 2019-01-30 | Disposition: A | Discharge status: Home / Self Care | Payer: PRIVATE HEALTH INSURANCE | Attending: General Surgery | Admitting: General Surgery

## 2019-01-30 DIAGNOSIS — I10 Essential (primary) hypertension: Secondary | ICD-10-CM | POA: Diagnosis not present

## 2019-01-30 DIAGNOSIS — I2510 Atherosclerotic heart disease of native coronary artery without angina pectoris: Secondary | ICD-10-CM | POA: Insufficient documentation

## 2019-01-30 DIAGNOSIS — C50919 Malignant neoplasm of unspecified site of unspecified female breast: Secondary | ICD-10-CM

## 2019-01-30 DIAGNOSIS — E119 Type 2 diabetes mellitus without complications: Secondary | ICD-10-CM | POA: Diagnosis not present

## 2019-01-30 DIAGNOSIS — Z95828 Presence of other vascular implants and grafts: Secondary | ICD-10-CM

## 2019-01-30 DIAGNOSIS — C50912 Malignant neoplasm of unspecified site of left female breast: Secondary | ICD-10-CM | POA: Diagnosis present

## 2019-01-30 HISTORY — PX: PORTACATH PLACEMENT: SHX2246

## 2019-01-30 LAB — GLUCOSE, CAPILLARY: Glucose-Capillary: 117 mg/dL — ABNORMAL HIGH (ref 70–99)

## 2019-01-30 SURGERY — INSERTION, TUNNELED CENTRAL VENOUS DEVICE, WITH PORT
Anesthesia: Monitor Anesthesia Care | Site: Chest | Laterality: Right

## 2019-01-30 MED ORDER — CHLORHEXIDINE GLUCONATE CLOTH 2 % EX PADS: 6.0000 | MEDICATED_PAD | Freq: Once | CUTANEOUS | Status: DC

## 2019-01-30 MED ORDER — KETOROLAC TROMETHAMINE 30 MG/ML IJ SOLN
30.0000 mg | Freq: Once | INTRAMUSCULAR | Status: AC
  Administered 2019-01-30: 30 mg via INTRAVENOUS
  Filled 2019-01-30: qty 1

## 2019-01-30 MED ORDER — FENTANYL CITRATE (PF) 100 MCG/2ML IJ SOLN
INTRAMUSCULAR | Status: AC
  Filled 2019-01-30: qty 2

## 2019-01-30 MED ORDER — HEPARIN SOD (PORK) LOCK FLUSH 100 UNIT/ML IV SOLN
INTRAVENOUS | Status: AC
  Filled 2019-01-30: qty 5

## 2019-01-30 MED ORDER — LACTATED RINGERS IV SOLN
INTRAVENOUS | Status: DC
  Administered 2019-01-30: 09:00:00 via INTRAVENOUS

## 2019-01-30 MED ORDER — CEFAZOLIN SODIUM-DEXTROSE 2-4 GM/100ML-% IV SOLN
2.0000 g | INTRAVENOUS | Status: AC
  Administered 2019-01-30: 12:00:00 2 g via INTRAVENOUS
  Filled 2019-01-30: qty 100

## 2019-01-30 MED ORDER — HEPARIN SOD (PORK) LOCK FLUSH 100 UNIT/ML IV SOLN
INTRAVENOUS | Status: DC | PRN
  Administered 2019-01-30: 500 [IU] via INTRAVENOUS

## 2019-01-30 MED ORDER — PROPOFOL 500 MG/50ML IV EMUL
INTRAVENOUS | Status: DC | PRN
  Administered 2019-01-30: 50 ug/kg/min via INTRAVENOUS

## 2019-01-30 MED ORDER — PROPOFOL 10 MG/ML IV BOLUS
INTRAVENOUS | Status: AC
  Filled 2019-01-30: qty 20

## 2019-01-30 MED ORDER — HYDROCODONE-ACETAMINOPHEN 7.5-325 MG PO TABS: 1.0000 | ORAL_TABLET | Freq: Once | ORAL | Status: DC | PRN

## 2019-01-30 MED ORDER — PROPOFOL 10 MG/ML IV BOLUS
INTRAVENOUS | Status: DC | PRN
  Administered 2019-01-30 (×3): 20 mg via INTRAVENOUS

## 2019-01-30 MED ORDER — SODIUM CHLORIDE (PF) 0.9 % IJ SOLN
INTRAMUSCULAR | Status: DC | PRN
  Administered 2019-01-30: 500 mL

## 2019-01-30 MED ORDER — MIDAZOLAM HCL 2 MG/2ML IJ SOLN: 0.5000 mg | Freq: Once | INTRAMUSCULAR | Status: DC | PRN

## 2019-01-30 MED ORDER — LIDOCAINE HCL (PF) 1 % IJ SOLN
INTRAMUSCULAR | Status: DC | PRN
  Administered 2019-01-30: 9 mL

## 2019-01-30 MED ORDER — PROMETHAZINE HCL 25 MG/ML IJ SOLN: 6.2500 mg | INTRAMUSCULAR | Status: DC | PRN

## 2019-01-30 MED ORDER — MIDAZOLAM HCL 2 MG/2ML IJ SOLN
INTRAMUSCULAR | Status: AC
  Filled 2019-01-30: qty 2

## 2019-01-30 MED ORDER — FENTANYL CITRATE (PF) 100 MCG/2ML IJ SOLN
INTRAMUSCULAR | Status: DC | PRN
  Administered 2019-01-30: 25 ug via INTRAVENOUS

## 2019-01-30 MED ORDER — MIDAZOLAM HCL 5 MG/5ML IJ SOLN
INTRAMUSCULAR | Status: DC | PRN
  Administered 2019-01-30: 2 mg via INTRAVENOUS

## 2019-01-30 MED ORDER — HYDROMORPHONE HCL 1 MG/ML IJ SOLN: 0.2500 mg | INTRAMUSCULAR | Status: DC | PRN

## 2019-01-30 MED ORDER — LIDOCAINE HCL (PF) 1 % IJ SOLN
INTRAMUSCULAR | Status: AC
  Filled 2019-01-30: qty 30

## 2019-01-30 SURGICAL SUPPLY — 30 items
BAG DECANTER FOR FLEXI CONT (MISCELLANEOUS) ×2 IMPLANT
CHLORAPREP W/TINT 10.5 ML (MISCELLANEOUS) ×2 IMPLANT
CLOTH BEACON ORANGE TIMEOUT ST (SAFETY) ×2 IMPLANT
COVER LIGHT HANDLE STERIS (MISCELLANEOUS) ×4 IMPLANT
COVER WAND RF STERILE (DRAPES) ×2 IMPLANT
DECANTER SPIKE VIAL GLASS SM (MISCELLANEOUS) ×2 IMPLANT
DERMABOND ADVANCED (GAUZE/BANDAGES/DRESSINGS) ×1
DERMABOND ADVANCED .7 DNX12 (GAUZE/BANDAGES/DRESSINGS) ×1 IMPLANT
DRAPE C-ARM FOLDED MOBILE STRL (DRAPES) ×2 IMPLANT
ELECT REM PT RETURN 9FT ADLT (ELECTROSURGICAL) ×2
ELECTRODE REM PT RTRN 9FT ADLT (ELECTROSURGICAL) ×1 IMPLANT
GLOVE BIO SURGEON STRL SZ7 (GLOVE) ×2 IMPLANT
GLOVE BIOGEL M 7.0 STRL (GLOVE) ×2 IMPLANT
GLOVE BIOGEL PI IND STRL 7.0 (GLOVE) ×2 IMPLANT
GLOVE BIOGEL PI INDICATOR 7.0 (GLOVE) ×2
GLOVE SURG SS PI 7.5 STRL IVOR (GLOVE) ×2 IMPLANT
GOWN STRL REUS W/TWL LRG LVL3 (GOWN DISPOSABLE) ×4 IMPLANT
IV NS 500ML (IV SOLUTION) ×1
IV NS 500ML BAXH (IV SOLUTION) ×1 IMPLANT
KIT PORT POWER 8FR ISP MRI (Port) ×2 IMPLANT
KIT TURNOVER KIT A (KITS) ×2 IMPLANT
NEEDLE HYPO 25X1 1.5 SAFETY (NEEDLE) ×2 IMPLANT
PACK MINOR (CUSTOM PROCEDURE TRAY) ×2 IMPLANT
PAD ARMBOARD 7.5X6 YLW CONV (MISCELLANEOUS) ×2 IMPLANT
SET BASIN LINEN APH (SET/KITS/TRAYS/PACK) ×2 IMPLANT
SUT MNCRL AB 4-0 PS2 18 (SUTURE) ×2 IMPLANT
SUT VIC AB 3-0 SH 27 (SUTURE) ×1
SUT VIC AB 3-0 SH 27X BRD (SUTURE) ×1 IMPLANT
SYR 5ML LL (SYRINGE) ×2 IMPLANT
SYR CONTROL 10ML LL (SYRINGE) ×2 IMPLANT

## 2019-01-30 NOTE — Discharge Instructions (Signed)
Implanted Port Home Guide °An implanted port is a device that is placed under the skin. It is usually placed in the chest. The device can be used to give IV medicine, to take blood, or for dialysis. You may have an implanted port if: °· You need IV medicine that would be irritating to the small veins in your hands or arms. °· You need IV medicines, such as antibiotics, for a long period of time. °· You need IV nutrition for a long period of time. °· You need dialysis. °Having a port means that your health care provider will not need to use the veins in your arms for these procedures. You may have fewer limitations when using a port than you would if you used other types of long-term IVs, and you will likely be able to return to normal activities after your incision heals. °An implanted port has two main parts: °· Reservoir. The reservoir is the part where a needle is inserted to give medicines or draw blood. The reservoir is round. After it is placed, it appears as a small, raised area under your skin. °· Catheter. The catheter is a thin, flexible tube that connects the reservoir to a vein. Medicine that is inserted into the reservoir goes into the catheter and then into the vein. °How is my port accessed? °To access your port: °· A numbing cream may be placed on the skin over the port site. °· Your health care provider will put on a mask and sterile gloves. °· The skin over your port will be cleaned carefully with a germ-killing soap and allowed to dry. °· Your health care provider will gently pinch the port and insert a needle into it. °· Your health care provider will check for a blood return to make sure the port is in the vein and is not clogged. °· If your port needs to remain accessed to get medicine continuously (constant infusion), your health care provider will place a clear bandage (dressing) over the needle site. The dressing and needle will need to be changed every week, or as told by your health care  provider. °What is flushing? °Flushing helps keep the port from getting clogged. Follow instructions from your health care provider about how and when to flush the port. Ports are usually flushed with saline solution or a medicine called heparin. The need for flushing will depend on how the port is used: °· If the port is only used from time to time to give medicines or draw blood, the port may need to be flushed: °? Before and after medicines have been given. °? Before and after blood has been drawn. °? As part of routine maintenance. Flushing may be recommended every 4-6 weeks. °· If a constant infusion is running, the port may not need to be flushed. °· Throw away any syringes in a disposal container that is meant for sharp items (sharps container). You can buy a sharps container from a pharmacy, or you can make one by using an empty hard plastic bottle with a cover. °How long will my port stay implanted? °The port can stay in for as long as your health care provider thinks it is needed. When it is time for the port to come out, a surgery will be done to remove it. The surgery will be similar to the procedure that was done to put the port in. °Follow these instructions at home: ° °· Flush your port as told by your health care provider. °·   If you need an infusion over several days, follow instructions from your health care provider about how to take care of your port site. Make sure you: °? Wash your hands with soap and water before you change your dressing. If soap and water are not available, use alcohol-based hand sanitizer. °? Change your dressing as told by your health care provider. °? Place any used dressings or infusion bags into a plastic bag. Throw that bag in the trash. °? Keep the dressing that covers the needle clean and dry. Do not get it wet. °? Do not use scissors or sharp objects near the tube. °? Keep the tube clamped, unless it is being used. °· Check your port site every day for signs of  infection. Check for: °? Redness, swelling, or pain. °? Fluid or blood. °? Pus or a bad smell. °· Protect the skin around the port site. °? Avoid wearing bra straps that rub or irritate the site. °? Protect the skin around your port from seat belts. Place a soft pad over your chest if needed. °· Bathe or shower as told by your health care provider. The site may get wet as long as you are not actively receiving an infusion. °· Return to your normal activities as told by your health care provider. Ask your health care provider what activities are safe for you. °· Carry a medical alert card or wear a medical alert bracelet at all times. This will let health care providers know that you have an implanted port in case of an emergency. °Get help right away if: °· You have redness, swelling, or pain at the port site. °· You have fluid or blood coming from your port site. °· You have pus or a bad smell coming from the port site. °· You have a fever. °Summary °· Implanted ports are usually placed in the chest for long-term IV access. °· Follow instructions from your health care provider about flushing the port and changing bandages (dressings). °· Take care of the area around your port by avoiding clothing that puts pressure on the area, and by watching for signs of infection. °· Protect the skin around your port from seat belts. Place a soft pad over your chest if needed. °· Get help right away if you have a fever or you have redness, swelling, pain, drainage, or a bad smell at the port site. °This information is not intended to replace advice given to you by your health care provider. Make sure you discuss any questions you have with your health care provider. °Document Released: 08/10/2005 Document Revised: 09/12/2016 Document Reviewed: 09/12/2016 °Elsevier Interactive Patient Education © 2019 [employer] ° ° ° ° °Monitored Anesthesia Care, Care After °These instructions provide you with information about caring for  yourself after your procedure. Your health care provider may also give you more specific instructions. Your treatment has been planned according to current medical practices, but problems sometimes occur. Call your health care provider if you have any problems or questions after your procedure. °What can I expect after the procedure? °After your procedure, you may: °· Feel sleepy for several hours. °· Feel clumsy and have poor balance for several hours. °· Feel forgetful about what happened after the procedure. °· Have poor judgment for several hours. °· Feel nauseous or vomit. °· Have a sore throat if you had a breathing tube during the procedure. °Follow these instructions at home: °For at least 24 hours after the procedure: ° °  ° °·   Have a responsible adult stay with you. It is important to have someone help care for you until you are awake and alert. °· Rest as needed. °· Do not: °? Participate in activities in which you could fall or become injured. °? Drive. °? Use heavy machinery. °? Drink alcohol. °? Take sleeping pills or medicines that cause drowsiness. °? Make important decisions or sign legal documents. °? Take care of children on your own. °Eating and drinking °· Follow the diet that is recommended by your health care provider. °· If you vomit, drink water, juice, or soup when you can drink without vomiting. °· Make sure you have little or no nausea before eating solid foods. °General instructions °· Take over-the-counter and prescription medicines only as told by your health care provider. °· If you have sleep apnea, surgery and certain medicines can increase your risk for breathing problems. Follow instructions from your health care provider about wearing your sleep device: °? Anytime you are sleeping, including during daytime naps. °? While taking prescription pain medicines, sleeping medicines, or medicines that make you drowsy. °· If you smoke, do not smoke without supervision. °· Keep all  follow-up visits as told by your health care provider. This is important. °Contact a health care provider if: °· You keep feeling nauseous or you keep vomiting. °· You feel light-headed. °· You develop a rash. °· You have a fever. °Get help right away if: °· You have trouble breathing. °Summary °· For several hours after your procedure, you may feel sleepy and have poor judgment. °· Have a responsible adult stay with you for at least 24 hours or until you are awake and alert. °This information is not intended to replace advice given to you by your health care provider. Make sure you discuss any questions you have with your health care provider. °Document Released: 12/01/2015 Document Revised: 03/26/2017 Document Reviewed: 12/01/2015 °Elsevier Interactive Patient Education © 2019 [employer] ° ° °

## 2019-01-30 NOTE — Anesthesia Preprocedure Evaluation (Signed)
Anesthesia Evaluation  Patient identified by MRN, date of birth, ID band Patient awake    Reviewed: Allergy & Precautions, NPO status , Patient's Chart, lab work & pertinent test results, reviewed documented beta blocker date and time   Airway Mallampati: I  TM Distance: >3 FB Neck ROM: Full    Dental no notable dental hx. (+) Edentulous Upper, Edentulous Lower   Pulmonary neg pulmonary ROS, former smoker,    Pulmonary exam normal breath sounds clear to auscultation       Cardiovascular Exercise Tolerance: Good hypertension, Pt. on medications and Pt. on home beta blockers + CAD  Normal cardiovascular examI Rhythm:Regular Rate:Normal  Denies CP/DOE Reports able to walk 1/2 mile    Neuro/Psych negative neurological ROS  negative psych ROS   GI/Hepatic negative GI ROS, Neg liver ROS,   Endo/Other  negative endocrine ROSdiabetes, Well Controlled, Type 2, Oral Hypoglycemic Agents  Renal/GU negative Renal ROS  negative genitourinary   Musculoskeletal negative musculoskeletal ROS (+)   Abdominal   Peds negative pediatric ROS (+)  Hematology negative hematology ROS (+)   Anesthesia Other Findings Breast Ca for port for CT  Reproductive/Obstetrics negative OB ROS                             Anesthesia Physical Anesthesia Plan  ASA: III  Anesthesia Plan: MAC   Post-op Pain Management:    Induction: Intravenous  PONV Risk Score and Plan:   Airway Management Planned: Nasal Cannula and Simple Face Mask  Additional Equipment:   Intra-op Plan:   Post-operative Plan:   Informed Consent: I have reviewed the patients History and Physical, chart, labs and discussed the procedure including the risks, benefits and alternatives for the proposed anesthesia with the patient or authorized representative who has indicated his/her understanding and acceptance.     Dental advisory given  Plan  Discussed with: CRNA  Anesthesia Plan Comments: (Plan Full PPE use  Plan MAC as tolerated -GA as needed -WTP with same )        Anesthesia Quick Evaluation

## 2019-01-30 NOTE — Anesthesia Postprocedure Evaluation (Signed)
Anesthesia Post Note  Patient: Education administrator  Procedure(s) Performed: INSERTION PORT-A-CATH (Right Chest)  Patient location during evaluation: PACU Anesthesia Type: MAC Level of consciousness: awake and alert and oriented Pain management: pain level controlled Vital Signs Assessment: post-procedure vital signs reviewed and stable Respiratory status: spontaneous breathing Cardiovascular status: blood pressure returned to baseline and stable Postop Assessment: no apparent nausea or vomiting Anesthetic complications: no     Last Vitals:  Vitals:   01/30/19 1316 01/30/19 1326  BP: (!) 185/81 (!) 205/95  Pulse: 66 71  Resp: 14 18  Temp:  36.8 C  SpO2: 96% 99%    Last Pain:  Vitals:   01/30/19 1326  TempSrc: Oral  PainSc: 0-No pain                 Tyriana Helmkamp

## 2019-01-30 NOTE — Op Note (Signed)
Patient:  Bridget Richardson  DOB:  November 08, 1955  MRN:  [id number]   Preop Diagnosis: Left breast carcinoma, need for central venous access  Postop Diagnosis: Same  Procedure: Port-A-Cath insertion  Surgeon: Aviva Signs, MD  Anes: MAC  Indications: Patient is a 63 year old black female who is about to undergo chemotherapy for treatment of a left breast carcinoma.  The risks and benefits of the procedure including bleeding, infection, and pneumothorax were fully explained to the patient, who gave informed consent.  Procedure note: The patient was placed in the Trendelenburg position after the right upper chest was prepped and draped using usual sterile technique with ChloraPrep.  Surgical site confirmation was performed.  1% Xylocaine was used for local anesthesia.  Incision was made below the right clavicle.  A subcutaneous pocket was formed.  A needle was advanced into the right subclavian vein using the Seldinger technique without difficulty.  A guidewire was then advanced into the right atrium under fluoroscopic guidance.  An introducer and peel-away sheath were placed over the guidewire.  The catheter was inserted through the peel-away sheath and the peel-away sheath was removed.  The catheter was then attached to the port and the port placed in subcutaneous pocket.  Adequate positioning was confirmed by fluoroscopy.  Good backflow of venous blood was noted on aspiration of the port.  Port was flushed with heparin flush.  The subcutaneous layer was reapproximated using a 3-0 Vicryl interrupted suture.  The skin was closed using a 4-0 Monocryl subcuticular suture.  Dermabond was applied.  All tape and needle counts were correct at the end of the procedure.  The patient was awakened and transferred to PACU in stable condition.  Complications: None  EBL: Minimal  Specimen: None

## 2019-01-30 NOTE — Transfer of Care (Signed)
Immediate Anesthesia Transfer of Care Note  Patient: Bridget Richardson  Procedure(s) Performed: INSERTION PORT-A-CATH (Right Chest)  Patient Location: PACU  Anesthesia Type:General  Level of Consciousness: awake  Airway & Oxygen Therapy: Patient Spontanous Breathing  Post-op Assessment: Report given to RN  Post vital signs: Reviewed  Last Vitals:  Vitals Value Taken Time  BP 179/77 01/30/2019 12:43 PM  Temp    Pulse 74 01/30/2019 12:46 PM  Resp 15 01/30/2019 12:46 PM  SpO2 97 % 01/30/2019 12:46 PM  Vitals shown include unvalidated device data.  Last Pain:  Vitals:   01/30/19 0837  TempSrc: Oral  PainSc: 0-No pain      Patients Stated Pain Goal: 7 (15/83/09 4076)  Complications: No apparent anesthesia complications

## 2019-01-30 NOTE — Interval H&P Note (Signed)
History and Physical Interval Note:  01/30/2019 11:15 AM  Bridget Richardson  has presented today for surgery, with the diagnosis of left breast cancer.  The various methods of treatment have been discussed with the patient and family. After consideration of risks, benefits and other options for treatment, the patient has consented to  Procedure(s): INSERTION PORT-A-CATH (Right) as a surgical intervention.  The patient's history has been reviewed, patient examined, no change in status, stable for surgery.  I have reviewed the patient's chart and labs.  Questions were answered to the patient's satisfaction.     Aviva Signs

## 2019-01-31 ENCOUNTER — Other Ambulatory Visit: Payer: Self-pay

## 2019-01-31 ENCOUNTER — Other Ambulatory Visit (HOSPITAL_COMMUNITY): Payer: Self-pay

## 2019-01-31 ENCOUNTER — Encounter (HOSPITAL_COMMUNITY): Payer: Self-pay | Admitting: General Surgery

## 2019-01-31 DIAGNOSIS — C50412 Malignant neoplasm of upper-outer quadrant of left female breast: Secondary | ICD-10-CM

## 2019-01-31 NOTE — Progress Notes (Signed)
START ON PATHWAY REGIMEN - Breast   Dose-Dense AC q14 days:   A cycle is every 14 days:     Doxorubicin      Cyclophosphamide      [id number]-[id number]   **Always confirm dose/schedule in your pharmacy ordering system**  Paclitaxel 80 mg/m2 Weekly:   Administer weekly:     Paclitaxel   **Always confirm dose/schedule in your pharmacy ordering system**  Patient Characteristics: Postoperative without Neoadjuvant Therapy (Pathologic Staging), Invasive Disease, Adjuvant Therapy, HER2 Negative/Unknown/Equivocal, ER Positive, Node Positive, Node Positive (1-3), Genomic Testing Not Performed, Chemotherapy Candidate Therapeutic Status: Postoperative without Neoadjuvant Therapy (Pathologic Staging) AJCC Grade: GX AJCC N Category: pNX AJCC M Category: cM0 ER Status: Positive (+) AJCC 8 Stage Grouping: IIB HER2 Status: Negative (-) Oncotype Dx Recurrence Score: Not Appropriate AJCC T Category: pTX PR Status: Positive (+) Has this patient completed genomic testing<= No - Did Not Order Test  Intent of Therapy: Curative Intent, Discussed with Patient

## 2019-02-01 ENCOUNTER — Telehealth (HOSPITAL_COMMUNITY): Payer: Self-pay | Admitting: Hematology

## 2019-02-01 ENCOUNTER — Encounter (HOSPITAL_COMMUNITY): Payer: Self-pay | Admitting: Hematology

## 2019-02-01 ENCOUNTER — Inpatient Hospital Stay (HOSPITAL_COMMUNITY): Payer: PRIVATE HEALTH INSURANCE

## 2019-02-01 ENCOUNTER — Inpatient Hospital Stay (HOSPITAL_BASED_OUTPATIENT_CLINIC_OR_DEPARTMENT_OTHER): Payer: PRIVATE HEALTH INSURANCE | Admitting: Hematology

## 2019-02-01 VITALS — BP 165/65 | HR 80 | Temp 98.6°F | Resp 18

## 2019-02-01 VITALS — BP 167/78 | HR 85 | Temp 98.9°F | Resp 18 | Wt 193.2 lb

## 2019-02-01 DIAGNOSIS — Z170 Estrogen receptor positive status [ER+]: Secondary | ICD-10-CM

## 2019-02-01 DIAGNOSIS — C50412 Malignant neoplasm of upper-outer quadrant of left female breast: Secondary | ICD-10-CM

## 2019-02-01 DIAGNOSIS — Z803 Family history of malignant neoplasm of breast: Secondary | ICD-10-CM

## 2019-02-01 DIAGNOSIS — D649 Anemia, unspecified: Secondary | ICD-10-CM | POA: Diagnosis not present

## 2019-02-01 DIAGNOSIS — Z87891 Personal history of nicotine dependence: Secondary | ICD-10-CM | POA: Insufficient documentation

## 2019-02-01 DIAGNOSIS — E119 Type 2 diabetes mellitus without complications: Secondary | ICD-10-CM

## 2019-02-01 DIAGNOSIS — Z7984 Long term (current) use of oral hypoglycemic drugs: Secondary | ICD-10-CM | POA: Diagnosis not present

## 2019-02-01 DIAGNOSIS — Z79899 Other long term (current) drug therapy: Secondary | ICD-10-CM | POA: Diagnosis not present

## 2019-02-01 DIAGNOSIS — R112 Nausea with vomiting, unspecified: Secondary | ICD-10-CM | POA: Insufficient documentation

## 2019-02-01 DIAGNOSIS — I10 Essential (primary) hypertension: Secondary | ICD-10-CM | POA: Diagnosis not present

## 2019-02-01 DIAGNOSIS — I2510 Atherosclerotic heart disease of native coronary artery without angina pectoris: Secondary | ICD-10-CM | POA: Diagnosis not present

## 2019-02-01 DIAGNOSIS — E785 Hyperlipidemia, unspecified: Secondary | ICD-10-CM | POA: Insufficient documentation

## 2019-02-01 DIAGNOSIS — Z5111 Encounter for antineoplastic chemotherapy: Secondary | ICD-10-CM | POA: Diagnosis present

## 2019-02-01 LAB — CBC WITH DIFFERENTIAL/PLATELET
Abs Immature Granulocytes: 0.02 10*3/uL (ref 0.00–0.07)
Basophils Absolute: 0 10*3/uL (ref 0.0–0.1)
Basophils Relative: 0 %
Eosinophils Absolute: 0.2 10*3/uL (ref 0.0–0.5)
Eosinophils Relative: 2 %
HCT: 32.1 % — ABNORMAL LOW (ref 36.0–46.0)
Hemoglobin: 9.8 g/dL — ABNORMAL LOW (ref 12.0–15.0)
Immature Granulocytes: 0 %
Lymphocytes Relative: 20 %
Lymphs Abs: 1.6 10*3/uL (ref 0.7–4.0)
MCH: 27.9 pg (ref 26.0–34.0)
MCHC: 30.5 g/dL (ref 30.0–36.0)
MCV: 91.5 fL (ref 80.0–100.0)
Monocytes Absolute: 0.7 10*3/uL (ref 0.1–1.0)
Monocytes Relative: 9 %
Neutro Abs: 5.3 10*3/uL (ref 1.7–7.7)
Neutrophils Relative %: 69 %
Platelets: 257 10*3/uL (ref 150–400)
RBC: 3.51 MIL/uL — ABNORMAL LOW (ref 3.87–5.11)
RDW: 14.2 % (ref 11.5–15.5)
WBC: 7.8 10*3/uL (ref 4.0–10.5)
nRBC: 0 % (ref 0.0–0.2)

## 2019-02-01 LAB — FOLATE: Folate: 13 ng/mL (ref >5.9)

## 2019-02-01 LAB — COMPREHENSIVE METABOLIC PANEL
ALT: 28 U/L (ref 0–44)
AST: 24 U/L (ref 15–41)
Albumin: 3.9 g/dL (ref 3.5–5.0)
Alkaline Phosphatase: 120 U/L (ref 38–126)
Anion gap: 13 (ref 5–15)
BUN: 11 mg/dL (ref 8–23)
CO2: 24 mmol/L (ref 22–32)
Calcium: 9.7 mg/dL (ref 8.9–10.3)
Chloride: 104 mmol/L (ref 98–111)
Creatinine, Ser: 0.56 mg/dL (ref 0.44–1.00)
GFR calc Af Amer: >60 mL/min (ref >60)
GFR calc non Af Amer: >60 mL/min (ref >60)
Glucose, Bld: 125 mg/dL — ABNORMAL HIGH (ref 70–99)
Potassium: 4.1 mmol/L (ref 3.5–5.1)
Sodium: 141 mmol/L (ref 135–145)
Total Bilirubin: 0.3 mg/dL (ref 0.3–1.2)
Total Protein: 7.9 g/dL (ref 6.5–8.1)

## 2019-02-01 LAB — IRON AND TIBC
Iron: 47 ug/dL (ref 28–170)
Saturation Ratios: 14 % (ref 10.4–31.8)
TIBC: 342 ug/dL (ref 250–450)
UIBC: 295 ug/dL

## 2019-02-01 LAB — FERRITIN: Ferritin: 251 ng/mL (ref 11–307)

## 2019-02-01 LAB — VITAMIN B12: Vitamin B-12: 544 pg/mL (ref 180–914)

## 2019-02-01 MED ORDER — DOXORUBICIN HCL CHEMO IV INJECTION 2 MG/ML
60.0000 mg/m2 | Freq: Once | INTRAVENOUS | Status: AC
  Administered 2019-02-01: 120 mg via INTRAVENOUS
  Filled 2019-02-01: qty 60

## 2019-02-01 MED ORDER — SODIUM CHLORIDE 0.9 % IV SOLN
600.0000 mg/m2 | Freq: Once | INTRAVENOUS | Status: AC
  Administered 2019-02-01: 1200 mg via INTRAVENOUS
  Filled 2019-02-01: qty 60

## 2019-02-01 MED ORDER — HEPARIN SOD (PORK) LOCK FLUSH 100 UNIT/ML IV SOLN
500.0000 [IU] | Freq: Once | INTRAVENOUS | Status: AC | PRN
  Administered 2019-02-01: 500 [IU]

## 2019-02-01 MED ORDER — PEGFILGRASTIM 6 MG/0.6ML ~~LOC~~ PSKT
6.0000 mg | PREFILLED_SYRINGE | Freq: Once | SUBCUTANEOUS | Status: AC
  Administered 2019-02-01: 13:00:00 6 mg via SUBCUTANEOUS
  Filled 2019-02-01: qty 0.6

## 2019-02-01 MED ORDER — SODIUM CHLORIDE 0.9 % IV SOLN
Freq: Once | INTRAVENOUS | Status: AC
  Administered 2019-02-01: 11:00:00 via INTRAVENOUS
  Filled 2019-02-01: qty 5

## 2019-02-01 MED ORDER — SODIUM CHLORIDE 0.9 % IV SOLN
Freq: Once | INTRAVENOUS | Status: AC
  Administered 2019-02-01: 10:00:00 via INTRAVENOUS

## 2019-02-01 MED ORDER — PALONOSETRON HCL INJECTION 0.25 MG/5ML
0.2500 mg | Freq: Once | INTRAVENOUS | Status: AC
  Administered 2019-02-01: 0.25 mg via INTRAVENOUS
  Filled 2019-02-01: qty 5

## 2019-02-01 MED ORDER — PROCHLORPERAZINE MALEATE 10 MG PO TABS: 10.0000 mg | ORAL_TABLET | Freq: Four times a day (QID) | ORAL | 1 refills | Status: DC | PRN

## 2019-02-01 MED ORDER — LIDOCAINE-PRILOCAINE 2.5-2.5 % EX CREA: TOPICAL_CREAM | CUTANEOUS | 3 refills | Status: DC

## 2019-02-01 MED ORDER — SODIUM CHLORIDE 0.9% FLUSH
10.0000 mL | INTRAVENOUS | Status: DC | PRN
  Administered 2019-02-01: 10 mL
  Filled 2019-02-01: qty 10

## 2019-02-01 NOTE — Progress Notes (Signed)
[place], [place] [address]   CLINIC:  Medical Oncology/Hematology  PCP:  [name], MD [address] [place] [address] [phone]   REASON FOR VISIT:  Follow-up for left breast cancer   BRIEF ONCOLOGIC HISTORY:    Malignant neoplasm of upper-outer quadrant of left breast in female, estrogen receptor positive ([place])   01/04/2019 Initial Diagnosis    Malignant neoplasm of upper-outer quadrant of left breast in female, estrogen receptor positive ([place])    01/19/2019 Cancer Staging    Staging form: Breast, AJCC 8th Edition - Pathologic stage from 01/19/2019: Stage IIA (pT2, pN1, cM0, G3, ER+, PR+, HER2-) - Signed by Derek Jack, MD on 01/19/2019    02/01/2019 -  Chemotherapy    The patient had DOXOrubicin (ADRIAMYCIN) chemo injection 120 mg, 60 mg/m2 = 120 mg, Intravenous,  Once, 1 of 4 cycles Administration: 120 mg (02/01/2019) palonosetron (ALOXI) injection 0.25 mg, 0.25 mg, Intravenous,  Once, 1 of 4 cycles Administration: 0.25 mg (02/01/2019) pegfilgrastim (NEULASTA ONPRO KIT) injection 6 mg, 6 mg, Subcutaneous, Once, 1 of 4 cycles Administration: 6 mg (02/01/2019) cyclophosphamide (CYTOXAN) 1,200 mg in sodium chloride 0.9 % 250 mL chemo infusion, 600 mg/m2 = 1,200 mg, Intravenous,  Once, 1 of 4 cycles Administration: 1,200 mg (02/01/2019) PACLitaxel (TAXOL) 162 mg in sodium chloride 0.9 % 250 mL chemo infusion (</= 12m/m2), 80 mg/m2, Intravenous,  Once, 0 of 12 cycles fosaprepitant (EMEND) 150 mg, dexamethasone (DECADRON) 12 mg in sodium chloride 0.9 % 145 mL IVPB, , Intravenous,  Once, 1 of 4 cycles Administration:  (02/01/2019)  for chemotherapy treatment.       CANCER STAGING: Cancer Staging Malignant neoplasm of upper-outer quadrant of left breast in female, estrogen receptor positive ([place] Staging form: Breast, AJCC 8th Edition - Clinical: No stage assigned - Unsigned - Pathologic stage from 01/19/2019:  Stage IIA (pT2, pN1, cM0, G3, ER+, PR+, HER2-) - Signed by KDerek Jack MD on 01/19/2019    INTERVAL HISTORY:  Ms. [id number].[id number]. female seen for follow-up for breast cancer.  She had port placed.  She did have a 2D echocardiogram done.  Denies any symptoms of PND or orthopnea.  Appetite and energy levels are 100%.  She does want to go back to work while she is receiving chemotherapy.  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  Denies any tingling or numbness in extremities.  Denies any fevers or night sweats.    REVIEW OF SYSTEMS:  Review of Systems  All other systems reviewed and are negative.    PAST MEDICAL/SURGICAL HISTORY:  Past Medical History:  Diagnosis Date  . Coronary artery disease   . Diabetes mellitus without complication ([place]   . Hypertension    Past Surgical History:  Procedure Laterality Date  . ANKLE SURGERY Right 2009  . MASTECTOMY MODIFIED RADICAL Left 01/04/2019   Procedure: MASTECTOMY MODIFIED RADICAL;  Surgeon: JAviva Signs MD;  Location: [facility];  Service: General;  Laterality: Left;  . PORTACATH PLACEMENT Right 01/30/2019   Procedure: INSERTION PORT-A-CATH;  Surgeon: JAviva Signs MD;  Location: [facility];  Service: General;  Laterality: Right;  . WRIST SURGERY Right 1997     SOCIAL HISTORY:  Social History   Socioeconomic History  . Marital status: Divorced    Spouse name: Not on file  . Number of children: Not on file  . Years of education: Not on file  . Highest  education level: Not on file  Occupational History  . Not on file  Social Needs  . Financial resource strain: Not on file  . Food insecurity:    Worry: Not on file    Inability: Not on file  . Transportation needs:    Medical: Not on file    Non-medical: Not on file  Tobacco Use  . Smoking status: Former Smoker    Packs/day: 0.25    Years: 2.00    Pack years: 0.50    Types: Cigarettes    Last attempt to quit: 1983    Years since quitting:  37.4  . Smokeless tobacco: Never Used  Substance and Sexual Activity  . Alcohol use: Never    Frequency: Never  . Drug use: Never  . Sexual activity: Not Currently  Lifestyle  . Physical activity:    Days per week: Not on file    Minutes per session: Not on file  . Stress: Not on file  Relationships  . Social connections:    Talks on phone: Not on file    Gets together: Not on file    Attends religious service: Not on file    Active member of club or organization: Not on file    Attends meetings of clubs or organizations: Not on file    Relationship status: Not on file  . Intimate partner violence:    Fear of current or ex partner: Not on file    Emotionally abused: Not on file    Physically abused: Not on file    Forced sexual activity: Not on file  Other Topics Concern  . Not on file  Social History Narrative  . Not on file    FAMILY HISTORY:  History reviewed. No pertinent family history.  CURRENT MEDICATIONS:  Outpatient Encounter Medications as of 02/01/2019  Medication Sig  . atorvastatin (LIPITOR) 40 MG tablet Take 40 mg by mouth at bedtime.   . carvedilol (COREG) 6.25 MG tablet Take 6.25 mg by mouth every morning.   . CYCLOPHOSPHAMIDE IV Inject into the vein every 14 (fourteen) days.  Marland Kitchen DOXORUBICIN HCL IV Inject into the vein every 14 (fourteen) days.  . enalapril (VASOTEC) 20 MG tablet Take 20 mg by mouth 2 (two) times daily.  . ferrous sulfate 325 (65 FE) MG tablet Take 325 mg by mouth daily with breakfast.  . lidocaine-prilocaine (EMLA) cream Apply small amount to port a cath site and cover with plastic wrap 1 hour prior to chemotherapy appointments  . metFORMIN (GLUCOPHAGE) 850 MG tablet Take 850 mg by mouth daily with breakfast.   . [START ON 03/29/2019] PACLITAXEL IV Inject into the vein once a week.  . prochlorperazine (COMPAZINE) 10 MG tablet Take 1 tablet (10 mg total) by mouth every 6 (six) hours as needed for nausea or vomiting.   No  facility-administered encounter medications on file as of 02/01/2019.     ALLERGIES:  No Known Allergies   PHYSICAL EXAM:  ECOG Performance status: 0  Vitals:   02/01/19 0853  BP: (!) 167/78  Pulse: 85  Resp: 18  Temp: 98.9 F (37.2 C)  SpO2: 99%   Filed Weights   02/01/19 0853  Weight: 193 lb 3.2 oz (87.6 kg)    Physical Exam Vitals signs reviewed.  Constitutional:      Appearance: Normal appearance.  Cardiovascular:     Rate and Rhythm: Normal rate and regular rhythm.     Heart sounds: Normal heart sounds.  Pulmonary:  Effort: Pulmonary effort is normal.     Breath sounds: Normal breath sounds.  Abdominal:     General: There is no distension.     Palpations: Abdomen is soft. There is no mass.  Musculoskeletal:        General: No swelling.  Skin:    General: Skin is warm.  Neurological:     General: No focal deficit present.     Mental Status: She is alert and oriented to person, place, and time.  Psychiatric:        Mood and Affect: Mood normal.        Behavior: Behavior normal.    Port site is well-healed.  LABORATORY DATA:  I have reviewed the labs as listed.  CBC    Component Value Date/Time   WBC 7.8 02/01/2019 0846   RBC 3.51 (L) 02/01/2019 0846   HGB 9.8 (L) 02/01/2019 0846   HCT 32.1 (L) 02/01/2019 0846   PLT 257 02/01/2019 0846   MCV 91.5 02/01/2019 0846   MCH 27.9 02/01/2019 0846   MCHC 30.5 02/01/2019 0846   RDW 14.2 02/01/2019 0846   LYMPHSABS 1.6 02/01/2019 0846   MONOABS 0.7 02/01/2019 0846   EOSABS 0.2 02/01/2019 0846   BASOSABS 0.0 02/01/2019 0846   CMP Latest Ref Rng & Units 02/01/2019 01/05/2019 12/30/2018  Glucose 70 - 99 mg/dL 125(H) 127(H) 125(H)  BUN 8 - 23 mg/dL 11 14 14   Creatinine 0.44 - 1.00 mg/dL 0.56 0.70 0.56  Sodium 135 - 145 mmol/L 141 140 138  Potassium 3.5 - 5.1 mmol/L 4.1 4.0 4.1  Chloride 98 - 111 mmol/L 104 105 103  CO2 22 - 32 mmol/L 24 29 25   Calcium 8.9 - 10.3 mg/dL 9.7 9.3 9.7  Total Protein 6.5 -  8.1 g/dL 7.9 - 7.8  Total Bilirubin 0.3 - 1.2 mg/dL 0.3 - 0.4  Alkaline Phos 38 - 126 U/L 120 - 88  AST 15 - 41 U/L 24 - 23  ALT 0 - 44 U/L 28 - 32       DIAGNOSTIC IMAGING:  I have independently reviewed the scans and discussed with the patient.   I have reviewed Venita Lick LPN's note and agree with the documentation.  I personally performed a face-to-face visit, made revisions and my assessment and plan is as follows.    ASSESSMENT & PLAN:   Malignant neoplasm of upper-outer quadrant of left breast in female, estrogen receptor positive ([place]) 1.  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  Mammogram on 10/25/2018 shows left breast 1 o'clock position, 2.5 x 2.2 x 2 centimeter mass with enlarged lymph node in the left axilla measuring 1.8 x 1.6 x 1.4 cm. - Biopsy on 11/01/2018 shows IDC, grade 3, ER/PR positive, Ki-67 70%, HER-2 2+ - Right breast needle biopsy on 12/02/2018 with benign breast parenchyma with fibrocystic change. - Left MRM on 01/04/2019, 3.2 cm IDC, grade 3, tumor focally present at the anterior margin, 1/7 lymph nodes positive, ER/PR positive, HER-2 negative by IHC. -PET/CT scan on 12/01/2018 shows hypermetabolic left breast lesion associated with hypermetabolic left axillary lymphadenopathy.  No other metastatic disease was seen. - Mastectomy wound is healing well.  We talked about the pathology report in detail. - As she had high-grade lymph node positive left breast cancer, I have recommended adjuvant chemotherapy with AC followed by Taxol. -We have requested HER-2 FISH testing on the biopsy which came back negative. -She  had a port placed by Dr. Arnoldo Morale. -2D echocardiogram on 01/26/2019 shows ejection fraction of 60 to 65%. -We discussed the side effects of chemotherapy regimen in detail.  She will proceed with her first cycle today.  We will see her back in 2 weeks for follow-up. -She wants to go back to part-time work  after first cycle.   2.  Family history: -Sister reportedly died of metastatic breast cancer at age 23. -I would recommend genetic testing.  3.  Normocytic anemia: -Hemoglobin today is 9.8.  We will check her ferritin, iron panel, A56 and folic acid levels.  Total time spent is 40 minutes with more than 50% of the time spent face-to-face discussing treatment plan, side effects and coordination of care.    Orders placed this encounter:  Orders Placed This Encounter  Procedures  . Iron and TIBC  . Ferritin  . Vitamin B12  . Folate      Derek Jack, MD [place] [phone]

## 2019-02-01 NOTE — Patient Instructions (Addendum)
[facility] at [facility] Discharge Instructions  You were seen today by Dr. Katragadda. He went over your recent lab results. He will see you back in 2 weeks for labs and follow up.   Thank you for choosing [facility] at [facility] to provide your oncology and hematology care.  To afford each patient quality time with our provider, please arrive at least 15 minutes before your scheduled appointment time.   If you have a lab appointment with the Cancer Center please come in thru the  Main Entrance and check in at the main information desk  You need to re-schedule your appointment should you arrive 10 or more minutes late.  We strive to give you quality time with our providers, and arriving late affects you and other patients whose appointments are after yours.  Also, if you no show three or more times for appointments you may be dismissed from the clinic at the providers discretion.     Again, thank you for choosing [facility].  Our hope is that these requests will decrease the amount of time that you wait before being seen by our physicians.       _____________________________________________________________  Should you have questions after your visit to [facility], please contact our office at [phone] between the hours of 8:00 a.m. and 4:30 p.m.  Voicemails left after 4:00 p.m. will not be returned until the following business day.  For prescription refill requests, have your pharmacy contact our office and allow 72 hours.    Cancer Center Support Programs:   > Cancer Support Group  2nd Tuesday of the month 1pm-2pm, Journey Room    

## 2019-02-01 NOTE — Patient Instructions (Signed)
[facility] Discharge Instructions for Patients Receiving Chemotherapy  Today you received the following chemotherapy agents   To help prevent nausea and vomiting after your treatment, we encourage you to take your nausea medication   If you develop nausea and vomiting that is not controlled by your nausea medication, call the clinic.   BELOW ARE SYMPTOMS THAT SHOULD BE REPORTED IMMEDIATELY:  *FEVER GREATER THAN 100.5 F  *CHILLS WITH OR WITHOUT FEVER  NAUSEA AND VOMITING THAT IS NOT CONTROLLED WITH YOUR NAUSEA MEDICATION  *UNUSUAL SHORTNESS OF BREATH  *UNUSUAL BRUISING OR BLEEDING  TENDERNESS IN MOUTH AND THROAT WITH OR WITHOUT PRESENCE OF ULCERS  *URINARY PROBLEMS  *BOWEL PROBLEMS  UNUSUAL RASH Items with * indicate a potential emergency and should be followed up as soon as possible.  Feel free to call the clinic should you have any questions or concerns. The clinic phone number is [phone].  Please show the CHEMO ALERT CARD at check-in to the Emergency Department and triage nurse.   

## 2019-02-01 NOTE — Progress Notes (Signed)
Labs reviewed with MD today. Proceed with treatment today per MD. Will get additional blood work per orders.   Marland KitchenKarie Kirks Richardson arrived today for [facility] neulasta on body injector. See MAR for administration details. Injector in place and engaged with green light indicator on flashing. Tolerated application with out problems.  Treatment given per orders. Patient tolerated it well without problems. Vitals stable and discharged home from clinic ambulatory. Follow up as scheduled.

## 2019-02-01 NOTE — Assessment & Plan Note (Signed)
1.  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  Mammogram on 10/25/2018 shows left breast 1 o'clock position, 2.5 x 2.2 x 2 centimeter mass with enlarged lymph node in the left axilla measuring 1.8 x 1.6 x 1.4 cm. - Biopsy on 11/01/2018 shows IDC, grade 3, ER/PR positive, Ki-67 70%, HER-2 2+ - Right breast needle biopsy on 12/02/2018 with benign breast parenchyma with fibrocystic change. - Left MRM on 01/04/2019, 3.2 cm IDC, grade 3, tumor focally present at the anterior margin, 1/7 lymph nodes positive, ER/PR positive, HER-2 negative by IHC. -PET/CT scan on 12/01/2018 shows hypermetabolic left breast lesion associated with hypermetabolic left axillary lymphadenopathy.  No other metastatic disease was seen. - Mastectomy wound is healing well.  We talked about the pathology report in detail. - As she had high-grade lymph node positive left breast cancer, I have recommended adjuvant chemotherapy with AC followed by Taxol. -We have requested HER-2 FISH testing on the biopsy which came back negative. -She had a port placed by Dr. Arnoldo Morale. -2D echocardiogram on 01/26/2019 shows ejection fraction of 60 to 65%. -We discussed the side effects of chemotherapy regimen in detail.  She will proceed with her first cycle today.  We will see her back in 2 weeks for follow-up. -She wants to go back to part-time work after first cycle.   2.  Family history: -Sister reportedly died of metastatic breast cancer at age 11. -I would recommend genetic testing.  3.  Normocytic anemia: -Hemoglobin today is 9.8.  We will check her ferritin, iron panel, F42 and folic acid levels.

## 2019-02-01 NOTE — Progress Notes (Signed)
02/01/19  Change to OnPro.  Per protocol/ Dr Rhys Martini, PharmD

## 2019-02-01 NOTE — Telephone Encounter (Signed)
Pts ins does not cover any of her chemo therapy cost and will only pay $500 a year on any Scans. Auth is not required for scans.  Applied pt to [employer] for free neulasta onpro. Status pending. Also informed pt of the [employer]. Per pt she meets fin guidelines. Application is fin docs. Pt will fax them to me asap.

## 2019-02-02 ENCOUNTER — Telehealth (HOSPITAL_COMMUNITY): Payer: Self-pay

## 2019-02-02 NOTE — Telephone Encounter (Signed)
24 hour follow up -patient is doing well, no complaints except a little nausea, she stated she is taking her compazine and that helped. No other complaints at this time. Neulasta on pro on and no issues.

## 2019-02-06 ENCOUNTER — Encounter (HOSPITAL_COMMUNITY): Payer: Self-pay | Admitting: *Deleted

## 2019-02-06 NOTE — Progress Notes (Signed)
Patient called the call center over the weekend and reports excessive nausea/vomiting.  They used standing orders and gave zofran and decadron.  These have been added to patient's medication profile.

## 2019-02-15 ENCOUNTER — Encounter (HOSPITAL_COMMUNITY): Payer: Self-pay | Admitting: *Deleted

## 2019-02-15 ENCOUNTER — Inpatient Hospital Stay (HOSPITAL_COMMUNITY): Payer: PRIVATE HEALTH INSURANCE

## 2019-02-15 ENCOUNTER — Encounter (HOSPITAL_COMMUNITY): Payer: Self-pay | Admitting: Emergency Medicine

## 2019-02-15 ENCOUNTER — Encounter (HOSPITAL_COMMUNITY): Payer: Self-pay | Admitting: Hematology

## 2019-02-15 ENCOUNTER — Inpatient Hospital Stay (HOSPITAL_BASED_OUTPATIENT_CLINIC_OR_DEPARTMENT_OTHER): Payer: PRIVATE HEALTH INSURANCE | Admitting: Hematology

## 2019-02-15 ENCOUNTER — Other Ambulatory Visit: Payer: Self-pay

## 2019-02-15 VITALS — BP 133/58 | HR 70 | Temp 98.0°F | Resp 18

## 2019-02-15 DIAGNOSIS — I2510 Atherosclerotic heart disease of native coronary artery without angina pectoris: Secondary | ICD-10-CM

## 2019-02-15 DIAGNOSIS — Z5111 Encounter for antineoplastic chemotherapy: Secondary | ICD-10-CM | POA: Diagnosis not present

## 2019-02-15 DIAGNOSIS — Z79899 Other long term (current) drug therapy: Secondary | ICD-10-CM

## 2019-02-15 DIAGNOSIS — I10 Essential (primary) hypertension: Secondary | ICD-10-CM

## 2019-02-15 DIAGNOSIS — C50412 Malignant neoplasm of upper-outer quadrant of left female breast: Secondary | ICD-10-CM

## 2019-02-15 DIAGNOSIS — E785 Hyperlipidemia, unspecified: Secondary | ICD-10-CM

## 2019-02-15 DIAGNOSIS — Z170 Estrogen receptor positive status [ER+]: Secondary | ICD-10-CM

## 2019-02-15 DIAGNOSIS — Z7984 Long term (current) use of oral hypoglycemic drugs: Secondary | ICD-10-CM

## 2019-02-15 DIAGNOSIS — R112 Nausea with vomiting, unspecified: Secondary | ICD-10-CM

## 2019-02-15 DIAGNOSIS — Z87891 Personal history of nicotine dependence: Secondary | ICD-10-CM

## 2019-02-15 DIAGNOSIS — D649 Anemia, unspecified: Secondary | ICD-10-CM

## 2019-02-15 DIAGNOSIS — Z803 Family history of malignant neoplasm of breast: Secondary | ICD-10-CM

## 2019-02-15 DIAGNOSIS — E119 Type 2 diabetes mellitus without complications: Secondary | ICD-10-CM

## 2019-02-15 LAB — COMPREHENSIVE METABOLIC PANEL
ALT: 24 U/L (ref 0–44)
AST: 11 U/L — ABNORMAL LOW (ref 15–41)
Albumin: 3.4 g/dL — ABNORMAL LOW (ref 3.5–5.0)
Alkaline Phosphatase: 127 U/L — ABNORMAL HIGH (ref 38–126)
Anion gap: 12 (ref 5–15)
BUN: 20 mg/dL (ref 8–23)
CO2: 28 mmol/L (ref 22–32)
Calcium: 9.4 mg/dL (ref 8.9–10.3)
Chloride: 100 mmol/L (ref 98–111)
Creatinine, Ser: 0.61 mg/dL (ref 0.44–1.00)
GFR calc Af Amer: >60 mL/min (ref >60)
GFR calc non Af Amer: >60 mL/min (ref >60)
Glucose, Bld: 98 mg/dL (ref 70–99)
Potassium: 4 mmol/L (ref 3.5–5.1)
Sodium: 140 mmol/L (ref 135–145)
Total Bilirubin: 0.5 mg/dL (ref 0.3–1.2)
Total Protein: 6.9 g/dL (ref 6.5–8.1)

## 2019-02-15 LAB — CBC WITH DIFFERENTIAL/PLATELET
Abs Immature Granulocytes: 1.56 K/uL — ABNORMAL HIGH (ref 0.00–0.07)
Basophils Absolute: 0.1 K/uL (ref 0.0–0.1)
Basophils Relative: 1 %
Eosinophils Absolute: 0 K/uL (ref 0.0–0.5)
Eosinophils Relative: 0 %
HCT: 33.6 % — ABNORMAL LOW (ref 36.0–46.0)
Hemoglobin: 10.3 g/dL — ABNORMAL LOW (ref 12.0–15.0)
Immature Granulocytes: 8 %
Lymphocytes Relative: 13 %
Lymphs Abs: 2.5 K/uL (ref 0.7–4.0)
MCH: 27.8 pg (ref 26.0–34.0)
MCHC: 30.7 g/dL (ref 30.0–36.0)
MCV: 90.8 fL (ref 80.0–100.0)
Monocytes Absolute: 1.7 K/uL — ABNORMAL HIGH (ref 0.1–1.0)
Monocytes Relative: 9 %
Neutro Abs: 13.3 K/uL — ABNORMAL HIGH (ref 1.7–7.7)
Neutrophils Relative %: 69 %
Platelets: 214 K/uL (ref 150–400)
RBC: 3.7 MIL/uL — ABNORMAL LOW (ref 3.87–5.11)
RDW: 14.5 % (ref 11.5–15.5)
WBC: 19.2 K/uL — ABNORMAL HIGH (ref 4.0–10.5)
nRBC: 1.9 % — ABNORMAL HIGH (ref 0.0–0.2)

## 2019-02-15 MED ORDER — PALONOSETRON HCL INJECTION 0.25 MG/5ML
0.2500 mg | Freq: Once | INTRAVENOUS | Status: AC
  Administered 2019-02-15: 11:00:00 0.25 mg via INTRAVENOUS

## 2019-02-15 MED ORDER — SODIUM CHLORIDE 0.9 % IV SOLN
600.0000 mg/m2 | Freq: Once | INTRAVENOUS | Status: AC
  Administered 2019-02-15: 1200 mg via INTRAVENOUS
  Filled 2019-02-15: qty 60

## 2019-02-15 MED ORDER — SODIUM CHLORIDE 0.9% FLUSH
10.0000 mL | INTRAVENOUS | Status: DC | PRN
  Administered 2019-02-15: 09:00:00 10 mL
  Filled 2019-02-15: qty 10

## 2019-02-15 MED ORDER — DOXORUBICIN HCL CHEMO IV INJECTION 2 MG/ML
60.0000 mg/m2 | Freq: Once | INTRAVENOUS | Status: AC
  Administered 2019-02-15: 12:00:00 120 mg via INTRAVENOUS
  Filled 2019-02-15: qty 60

## 2019-02-15 MED ORDER — PEGFILGRASTIM 6 MG/0.6ML ~~LOC~~ PSKT
6.0000 mg | PREFILLED_SYRINGE | Freq: Once | SUBCUTANEOUS | Status: AC
  Administered 2019-02-15: 6 mg via SUBCUTANEOUS

## 2019-02-15 MED ORDER — PALONOSETRON HCL INJECTION 0.25 MG/5ML
INTRAVENOUS | Status: AC
  Filled 2019-02-15: qty 5

## 2019-02-15 MED ORDER — SODIUM CHLORIDE 0.9 % IV SOLN
Freq: Once | INTRAVENOUS | Status: AC
  Administered 2019-02-15: 11:00:00 via INTRAVENOUS
  Filled 2019-02-15: qty 5

## 2019-02-15 MED ORDER — HEPARIN SOD (PORK) LOCK FLUSH 100 UNIT/ML IV SOLN
500.0000 [IU] | Freq: Once | INTRAVENOUS | Status: AC | PRN
  Administered 2019-02-15: 13:00:00 500 [IU]

## 2019-02-15 MED ORDER — SODIUM CHLORIDE 0.9 % IV SOLN
Freq: Once | INTRAVENOUS | Status: AC
  Administered 2019-02-15: 10:00:00 via INTRAVENOUS

## 2019-02-15 NOTE — Patient Instructions (Signed)
[facility] at [facility] Discharge Instructions  Labs drawn from portacath today   Thank you for choosing [facility] at [facility] to provide your oncology and hematology care.  To afford each patient quality time with our provider, please arrive at least 15 minutes before your scheduled appointment time.   If you have a lab appointment with the Cancer Center please come in thru the  Main Entrance and check in at the main information desk  You need to re-schedule your appointment should you arrive 10 or more minutes late.  We strive to give you quality time with our providers, and arriving late affects you and other patients whose appointments are after yours.  Also, if you no show three or more times for appointments you may be dismissed from the clinic at the providers discretion.     Again, thank you for choosing [facility].  Our hope is that these requests will decrease the amount of time that you wait before being seen by our physicians.       _____________________________________________________________  Should you have questions after your visit to  Cancer Center, please contact our office at [phone] between the hours of 8:00 a.m. and 4:30 p.m.  Voicemails left after 4:00 p.m. will not be returned until the following business day.  For prescription refill requests, have your pharmacy contact our office and allow 72 hours.    Cancer Center Support Programs:   > Cancer Support Group  2nd Tuesday of the month 1pm-2pm, Journey Room   

## 2019-02-15 NOTE — Patient Instructions (Signed)
[facility] Discharge Instructions for Patients Receiving Chemotherapy   Beginning January 23rd 2017 lab work for the [facility] will be done in the  Main lab at [facility] on 1st floor. If you have a lab appointment with the [facility] please come in thru the  Main Entrance and check in at the main information desk   Today you received the following chemotherapy agents Adriamycin and Cytoxan as well as Neulasta on-pro. Follow-up as scheduled. Call clinic for any questions or concerns  To help prevent nausea and vomiting after your treatment, we encourage you to take your nausea medication   If you develop nausea and vomiting, or diarrhea that is not controlled by your medication, call the clinic.  The clinic phone number is [phone]. Office hours are Monday-Friday 8:30am-5:00pm.  BELOW ARE SYMPTOMS THAT SHOULD BE REPORTED IMMEDIATELY:  *FEVER GREATER THAN 101.0 F  *CHILLS WITH OR WITHOUT FEVER  NAUSEA AND VOMITING THAT IS NOT CONTROLLED WITH YOUR NAUSEA MEDICATION  *UNUSUAL SHORTNESS OF BREATH  *UNUSUAL BRUISING OR BLEEDING  TENDERNESS IN MOUTH AND THROAT WITH OR WITHOUT PRESENCE OF ULCERS  *URINARY PROBLEMS  *BOWEL PROBLEMS  UNUSUAL RASH Items with * indicate a potential emergency and should be followed up as soon as possible. If you have an emergency after office hours please contact your primary care physician or go to the nearest emergency department.  Please call the clinic during office hours if you have any questions or concerns.   You may also contact the Patient Navigator at [phone] should you have any questions or need assistance in obtaining follow up care.      Resources For Cancer Patients and their Caregivers ? American Cancer Society: Can assist with transportation, wigs, general needs, runs Look Good Feel Better.        (224) 882-2430 ? Cancer Care: Provides financial assistance, online support groups,  medication/co-pay assistance.  1-800-813-HOPE 479-105-8157) ? [facility] [place] Assists [place] cancer patients and their families through emotional , educational and financial support.  940-750-7402 ? [place] DSS Where to apply for food stamps, Medicaid and utility assistance. (726)231-1736 ? [facility]: Transportation to medical appointments. (743)430-9956 ? Social Security Administration: May apply for disability if have a Stage IV cancer. 828-165-2215 514-274-9998 ? [employer], Disability and Transit Services: Assists with nutrition, care and transit needs. (661)202-4146

## 2019-02-15 NOTE — Assessment & Plan Note (Addendum)
1.  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  Mammogram on 10/25/2018 shows left breast 1 o'clock position, 2.5 x 2.2 x 2 centimeter mass with enlarged lymph node in the left axilla measuring 1.8 x 1.6 x 1.4 cm. - Biopsy on 11/01/2018 shows IDC, grade 3, ER/PR positive, Ki-67 70%, HER-2 2+ - Right breast needle biopsy on 12/02/2018 with benign breast parenchyma with fibrocystic change. - Left MRM on 01/04/2019, 3.2 cm IDC, grade 3, tumor focally present at the anterior margin, 1/7 lymph nodes positive, ER/PR positive, HER-2 negative by IHC.  HER-2 by fish testing on the biopsy also negative. -PET/CT scan on 12/01/2018 shows hypermetabolic left breast lesion associated with hypermetabolic left axillary lymphadenopathy.  No other metastatic disease was seen. -2D echocardiogram on 01/26/2019 shows ejection fraction of 60 to 65%. - Cycle 1 of dose dense AC on 02/01/2019. - She had nausea and vomiting with dry heaves 1 to 2 days after cycle 1.  Compazine did not help. -We called and Zofran 4 mg every 8 hours as needed which helped.  Dr. Franchot Heidelberg also prescribe dexamethasone. -We reviewed her blood work.  She may proceed with cycle 2 today without any dose modifications. - She is wanting to work 2 days a week.  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 -We will see her back in 2 weeks for follow-up.  2.  Family history: -Sister reportedly died of metastatic breast cancer at age 31. -I would recommend genetic testing.  3.  Normocytic anemia: -Nutritional deficiency work-up including ferritin, iron panel, W11 and folic acid was normal.

## 2019-02-15 NOTE — Progress Notes (Signed)
1009 Labs reviewed with and pt seen by [name] today and pt approved for chemo tx today per MD                  [facility] tolerated chemo tx with Neulasta on-pro well without complaints or incident. Neulasta on-pro applied to pt's right arm with green indicator light flashing upon discharge. VSS upon discharge. Pt discharged self ambulatory in satisfactory condition

## 2019-02-15 NOTE — Progress Notes (Signed)
Bridget Richardson, [place] [address]   CLINIC:  Medical Oncology/Hematology  PCP:  [name], MD [address] [place] [address] [phone]   REASON FOR VISIT:  Follow-up for left breast cancer   BRIEF ONCOLOGIC HISTORY:  Oncology History  Malignant neoplasm of upper-outer quadrant of left breast in female, estrogen receptor positive ([place])  01/04/2019 Initial Diagnosis   Malignant neoplasm of upper-outer quadrant of left breast in female, estrogen receptor positive ([place] [facility])   01/19/2019 Cancer Staging   Staging form: Breast, AJCC 8th Edition - Pathologic stage from 01/19/2019: Stage IIA (pT2, pN1, cM0, G3, ER+, PR+, HER2-) - Signed by Derek Jack, MD on 01/19/2019   02/01/2019 -  Chemotherapy   The patient had DOXOrubicin (ADRIAMYCIN) chemo injection 120 mg, 60 mg/m2 = 120 mg, Intravenous,  Once, 1 of 4 cycles Administration: 120 mg (02/01/2019) palonosetron (ALOXI) injection 0.25 mg, 0.25 mg, Intravenous,  Once, 1 of 4 cycles Administration: 0.25 mg (02/01/2019) pegfilgrastim (NEULASTA ONPRO KIT) injection 6 mg, 6 mg, Subcutaneous, Once, 1 of 4 cycles Administration: 6 mg (02/01/2019) cyclophosphamide (CYTOXAN) 1,200 mg in sodium chloride 0.9 % 250 mL chemo infusion, 600 mg/m2 = 1,200 mg, Intravenous,  Once, 1 of 4 cycles Administration: 1,200 mg (02/01/2019) PACLitaxel (TAXOL) 162 mg in sodium chloride 0.9 % 250 mL chemo infusion (</= 22m/m2), 80 mg/m2, Intravenous,  Once, 0 of 12 cycles fosaprepitant (EMEND) 150 mg, dexamethasone (DECADRON) 12 mg in sodium chloride 0.9 % 145 mL IVPB, , Intravenous,  Once, 1 of 4 cycles Administration:  (02/01/2019)  for chemotherapy treatment.       CANCER STAGING: Cancer Staging Malignant neoplasm of upper-outer quadrant of left breast in female, estrogen receptor positive ([place] Staging form: Breast, AJCC 8th Edition - Clinical: No stage assigned - Unsigned - Pathologic stage from  01/19/2019: Stage IIA (pT2, pN1, cM0, G3, ER+, PR+, HER2-) - Signed by KDerek Jack MD on 01/19/2019    INTERVAL HISTORY:  [name]89y.o. female seen for follow-up and cycle 2 of chemotherapy.  She has received cycle 1 on 02/01/2019.  Denied any GI side effects including constipation or diarrhea.  She did have nausea and vomiting with dry heaves.  Compazine did not help.  She started taking Zofran 4 mg which helped.  She was also prescribed dexamethasone 4 mg tablets.  Denied any bleeding per rectum or melena.  Denies any change in bowel habits.  Appetite is 100%.  Energy levels are 75%.  Denied any fevers or infections.  No ER visits or hospitalizations.   REVIEW OF SYSTEMS:  Review of Systems  Gastrointestinal: Positive for nausea and vomiting.  All other systems reviewed and are negative.    PAST MEDICAL/SURGICAL HISTORY:  Past Medical History:  Diagnosis Date  . Coronary artery disease   . Diabetes mellitus without complication ([place]   . Hypertension    Past Surgical History:  Procedure Laterality Date  . ANKLE SURGERY Right 2009  . MASTECTOMY MODIFIED RADICAL Left 01/04/2019   Procedure: MASTECTOMY MODIFIED RADICAL;  Surgeon: JAviva Signs MD;  Location: [facility];  Service: General;  Laterality: Left;  . PORTACATH PLACEMENT Right 01/30/2019   Procedure: INSERTION PORT-A-CATH;  Surgeon: JAviva Signs MD;  Location: [facility];  Service: General;  Laterality: Right;  . WRIST SURGERY Right 1997     SOCIAL HISTORY:  Social History   Socioeconomic History  . Marital status: Divorced    Spouse name: Not on file  .  Number of children: Not on file  . Years of education: Not on file  . Highest education level: Not on file  Occupational History  . Not on file  Social Needs  . Financial resource strain: Not on file  . Food insecurity    Worry: Not on file    Inability: Not on file  . Transportation needs    Medical: Not on file    Non-medical: Not on file  Tobacco Use   . Smoking status: Former Smoker    Packs/day: 0.25    Years: 2.00    Pack years: 0.50    Types: Cigarettes    Quit date: 1983    Years since quitting: 37.5  . Smokeless tobacco: Never Used  Substance and Sexual Activity  . Alcohol use: Never    Frequency: Never  . Drug use: Never  . Sexual activity: Not Currently  Lifestyle  . Physical activity    Days per week: Not on file    Minutes per session: Not on file  . Stress: Not on file  Relationships  . Social Herbalist on phone: Not on file    Gets together: Not on file    Attends religious service: Not on file    Active member of club or organization: Not on file    Attends meetings of clubs or organizations: Not on file    Relationship status: Not on file  . Intimate partner violence    Fear of current or ex partner: Not on file    Emotionally abused: Not on file    Physically abused: Not on file    Forced sexual activity: Not on file  Other Topics Concern  . Not on file  Social History Narrative  . Not on file    FAMILY HISTORY:  History reviewed. No pertinent family history.  CURRENT MEDICATIONS:  Outpatient Encounter Medications as of 02/15/2019  Medication Sig  . atorvastatin (LIPITOR) 40 MG tablet Take 40 mg by mouth at bedtime.   . carvedilol (COREG) 6.25 MG tablet Take 6.25 mg by mouth every morning.   . CYCLOPHOSPHAMIDE IV Inject into the vein every 14 (fourteen) days.  Marland Kitchen dexamethasone (DECADRON) 4 MG tablet Take 8 mg by mouth 2 (two) times daily with a meal.  . DOXORUBICIN HCL IV Inject into the vein every 14 (fourteen) days.  . enalapril (VASOTEC) 20 MG tablet Take 20 mg by mouth 2 (two) times daily.  . ferrous sulfate 325 (65 FE) MG tablet Take 325 mg by mouth daily with breakfast.  . lidocaine-prilocaine (EMLA) cream Apply small amount to port a cath site and cover with plastic wrap 1 hour prior to chemotherapy appointments  . lidocaine-prilocaine (EMLA) cream Apply to affected area once  .  metFORMIN (GLUCOPHAGE) 850 MG tablet Take 850 mg by mouth daily with breakfast.   . ondansetron (ZOFRAN) 4 MG tablet Take 4 mg by mouth every 8 (eight) hours as needed for nausea or vomiting.  Derrill Memo ON 03/29/2019] PACLITAXEL IV Inject into the vein once a week.  . prochlorperazine (COMPAZINE) 10 MG tablet Take 1 tablet (10 mg total) by mouth every 6 (six) hours as needed for nausea or vomiting.  . prochlorperazine (COMPAZINE) 10 MG tablet Take 1 tablet (10 mg total) by mouth every 6 (six) hours as needed (Nausea or vomiting).   No facility-administered encounter medications on file as of 02/15/2019.     ALLERGIES:  No Known Allergies   PHYSICAL EXAM:  ECOG Performance status: 0  Vitals:   02/15/19 0902  BP: (!) 180/70  Pulse: 75  Resp: 18  Temp: 98.2 F (36.8 C)  SpO2: 97%   Filed Weights   02/15/19 0902  Weight: 190 lb (86.2 kg)    Physical Exam Vitals signs reviewed.  Constitutional:      Appearance: Normal appearance.  Cardiovascular:     Rate and Rhythm: Normal rate and regular rhythm.     Heart sounds: Normal heart sounds.  Pulmonary:     Effort: Pulmonary effort is normal.     Breath sounds: Normal breath sounds.  Abdominal:     General: There is no distension.     Palpations: Abdomen is soft. There is no mass.  Musculoskeletal:        General: No swelling.  Skin:    General: Skin is warm.  Neurological:     General: No focal deficit present.     Mental Status: She is alert and oriented to person, place, and time.  Psychiatric:        Mood and Affect: Mood normal.        Behavior: Behavior normal.     LABORATORY DATA:  I have reviewed the labs as listed.  CBC    Component Value Date/Time   WBC 19.2 (H) 02/15/2019 0920   RBC 3.70 (L) 02/15/2019 0920   HGB 10.3 (L) 02/15/2019 0920   HCT 33.6 (L) 02/15/2019 0920   PLT 214 02/15/2019 0920   MCV 90.8 02/15/2019 0920   MCH 27.8 02/15/2019 0920   MCHC 30.7 02/15/2019 0920   RDW 14.5 02/15/2019 0920    LYMPHSABS PENDING 02/15/2019 0920   MONOABS PENDING 02/15/2019 0920   EOSABS PENDING 02/15/2019 0920   BASOSABS PENDING 02/15/2019 0920   CMP Latest Ref Rng & Units 02/01/2019 01/05/2019 12/30/2018  Glucose 70 - 99 mg/dL 125(H) 127(H) 125(H)  BUN 8 - 23 mg/dL 11 14 14   Creatinine 0.44 - 1.00 mg/dL 0.56 0.70 0.56  Sodium 135 - 145 mmol/L 141 140 138  Potassium 3.5 - 5.1 mmol/L 4.1 4.0 4.1  Chloride 98 - 111 mmol/L 104 105 103  CO2 22 - 32 mmol/L 24 29 25   Calcium 8.9 - 10.3 mg/dL 9.7 9.3 9.7  Total Protein 6.5 - 8.1 g/dL 7.9 - 7.8  Total Bilirubin 0.3 - 1.2 mg/dL 0.3 - 0.4  Alkaline Phos 38 - 126 U/L 120 - 88  AST 15 - 41 U/L 24 - 23  ALT 0 - 44 U/L 28 - 32       DIAGNOSTIC IMAGING:  I have independently reviewed the scans and discussed with the patient.    ASSESSMENT & PLAN:   Malignant neoplasm of upper-outer quadrant of left breast in female, estrogen receptor positive ([place]) 1.  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  Mammogram on 10/25/2018 shows left breast 1 o'clock position, 2.5 x 2.2 x 2 centimeter mass with enlarged lymph node in the left axilla measuring 1.8 x 1.6 x 1.4 cm. - Biopsy on 11/01/2018 shows IDC, grade 3, ER/PR positive, Ki-67 70%, HER-2 2+ - Right breast needle biopsy on 12/02/2018 with benign breast parenchyma with fibrocystic change. - Left MRM on 01/04/2019, 3.2 cm IDC, grade 3, tumor focally present at the anterior margin, 1/7 lymph nodes positive, ER/PR positive, HER-2 negative by IHC.  HER-2 by fish testing on the biopsy also negative. -PET/CT scan on 12/01/2018 shows hypermetabolic  left breast lesion associated with hypermetabolic left axillary lymphadenopathy.  No other metastatic disease was seen. -2D echocardiogram on 01/26/2019 shows ejection fraction of 60 to 65%. - Cycle 1 of dose dense AC on 02/01/2019.  2.  Family history: -Sister reportedly died of metastatic breast cancer at age 29. -I would  recommend genetic testing.  3.  Normocytic anemia: -Nutritional deficiency work-up including ferritin, iron panel, I37 and folic acid was normal.   Total time spent is 25 minutes with more than 50% of the time spent face-to-face discussing treatment plan, side effects and coordination of care.    Orders placed this encounter:  No orders of the defined types were placed in this encounter.     Derek Jack, MD [place] [phone]

## 2019-03-01 ENCOUNTER — Inpatient Hospital Stay (HOSPITAL_COMMUNITY): Payer: PRIVATE HEALTH INSURANCE

## 2019-03-01 ENCOUNTER — Inpatient Hospital Stay (HOSPITAL_BASED_OUTPATIENT_CLINIC_OR_DEPARTMENT_OTHER): Payer: PRIVATE HEALTH INSURANCE | Admitting: Hematology

## 2019-03-01 ENCOUNTER — Encounter (HOSPITAL_COMMUNITY): Payer: Self-pay | Admitting: Hematology

## 2019-03-01 ENCOUNTER — Inpatient Hospital Stay (HOSPITAL_COMMUNITY): Payer: PRIVATE HEALTH INSURANCE | Attending: Hematology

## 2019-03-01 ENCOUNTER — Other Ambulatory Visit: Payer: Self-pay

## 2019-03-01 VITALS — BP 167/76 | HR 95 | Temp 97.3°F | Resp 16 | Wt 183.4 lb

## 2019-03-01 DIAGNOSIS — D701 Agranulocytosis secondary to cancer chemotherapy: Secondary | ICD-10-CM | POA: Insufficient documentation

## 2019-03-01 DIAGNOSIS — I10 Essential (primary) hypertension: Secondary | ICD-10-CM

## 2019-03-01 DIAGNOSIS — Z87891 Personal history of nicotine dependence: Secondary | ICD-10-CM

## 2019-03-01 DIAGNOSIS — Z79899 Other long term (current) drug therapy: Secondary | ICD-10-CM | POA: Diagnosis not present

## 2019-03-01 DIAGNOSIS — Z7984 Long term (current) use of oral hypoglycemic drugs: Secondary | ICD-10-CM

## 2019-03-01 DIAGNOSIS — E119 Type 2 diabetes mellitus without complications: Secondary | ICD-10-CM | POA: Insufficient documentation

## 2019-03-01 DIAGNOSIS — Z170 Estrogen receptor positive status [ER+]: Secondary | ICD-10-CM

## 2019-03-01 DIAGNOSIS — Z5189 Encounter for other specified aftercare: Secondary | ICD-10-CM | POA: Diagnosis present

## 2019-03-01 DIAGNOSIS — D649 Anemia, unspecified: Secondary | ICD-10-CM | POA: Insufficient documentation

## 2019-03-01 DIAGNOSIS — Z5111 Encounter for antineoplastic chemotherapy: Secondary | ICD-10-CM | POA: Insufficient documentation

## 2019-03-01 DIAGNOSIS — Z803 Family history of malignant neoplasm of breast: Secondary | ICD-10-CM | POA: Diagnosis not present

## 2019-03-01 DIAGNOSIS — C50412 Malignant neoplasm of upper-outer quadrant of left female breast: Secondary | ICD-10-CM | POA: Insufficient documentation

## 2019-03-01 DIAGNOSIS — I2510 Atherosclerotic heart disease of native coronary artery without angina pectoris: Secondary | ICD-10-CM

## 2019-03-01 DIAGNOSIS — E785 Hyperlipidemia, unspecified: Secondary | ICD-10-CM

## 2019-03-01 LAB — COMPREHENSIVE METABOLIC PANEL
ALT: 16 U/L (ref 0–44)
AST: 14 U/L — ABNORMAL LOW (ref 15–41)
Albumin: 3.8 g/dL (ref 3.5–5.0)
Alkaline Phosphatase: 99 U/L (ref 38–126)
Anion gap: 10 (ref 5–15)
BUN: 8 mg/dL (ref 8–23)
CO2: 26 mmol/L (ref 22–32)
Calcium: 9.5 mg/dL (ref 8.9–10.3)
Chloride: 105 mmol/L (ref 98–111)
Creatinine, Ser: 0.58 mg/dL (ref 0.44–1.00)
GFR calc Af Amer: >60 mL/min (ref >60)
GFR calc non Af Amer: >60 mL/min (ref >60)
Glucose, Bld: 137 mg/dL — ABNORMAL HIGH (ref 70–99)
Potassium: 3.9 mmol/L (ref 3.5–5.1)
Sodium: 141 mmol/L (ref 135–145)
Total Bilirubin: 0.5 mg/dL (ref 0.3–1.2)
Total Protein: 7.3 g/dL (ref 6.5–8.1)

## 2019-03-01 LAB — CBC WITH DIFFERENTIAL/PLATELET
Abs Immature Granulocytes: 0 10*3/uL (ref 0.00–0.07)
Basophils Absolute: 0 10*3/uL (ref 0.0–0.1)
Basophils Relative: 1 %
Eosinophils Absolute: 0 10*3/uL (ref 0.0–0.5)
Eosinophils Relative: 2 %
HCT: 30 % — ABNORMAL LOW (ref 36.0–46.0)
Hemoglobin: 9.4 g/dL — ABNORMAL LOW (ref 12.0–15.0)
Immature Granulocytes: 0 %
Lymphocytes Relative: 39 %
Lymphs Abs: 0.7 10*3/uL (ref 0.7–4.0)
MCH: 28.1 pg (ref 26.0–34.0)
MCHC: 31.3 g/dL (ref 30.0–36.0)
MCV: 89.8 fL (ref 80.0–100.0)
Monocytes Absolute: 0.6 10*3/uL (ref 0.1–1.0)
Monocytes Relative: 31 %
Neutro Abs: 0.5 10*3/uL — ABNORMAL LOW (ref 1.7–7.7)
Neutrophils Relative %: 27 %
Platelets: 269 10*3/uL (ref 150–400)
RBC: 3.34 MIL/uL — ABNORMAL LOW (ref 3.87–5.11)
RDW: 14.6 % (ref 11.5–15.5)
WBC: 1.8 10*3/uL — ABNORMAL LOW (ref 4.0–10.5)
nRBC: 0 % (ref 0.0–0.2)

## 2019-03-01 MED ORDER — FILGRASTIM 480 MCG/0.8ML IJ SOSY
480.0000 ug | PREFILLED_SYRINGE | Freq: Once | INTRAMUSCULAR | Status: AC
  Administered 2019-03-01: 480 ug via SUBCUTANEOUS
  Filled 2019-03-01: qty 0.8

## 2019-03-01 MED ORDER — HEPARIN SOD (PORK) LOCK FLUSH 100 UNIT/ML IV SOLN
500.0000 [IU] | Freq: Once | INTRAVENOUS | Status: AC
  Administered 2019-03-01: 500 [IU] via INTRAVENOUS

## 2019-03-01 MED ORDER — SODIUM CHLORIDE 0.9% FLUSH
10.0000 mL | Freq: Once | INTRAVENOUS | Status: AC
  Administered 2019-03-01: 10 mL via INTRAVENOUS

## 2019-03-01 NOTE — Progress Notes (Signed)
Labs reviewed with MD today. ANC is 500. Will give Neupagen per MD and treat tomorrow if labs are back to normal.   Vitals stable and discharged home from clinic ambulatory. Follow up as scheduled.

## 2019-03-01 NOTE — Assessment & Plan Note (Signed)
1.  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  Mammogram on 10/25/2018 shows left breast 1 o'clock position, 2.5 x 2.2 x 2 centimeter mass with enlarged lymph node in the left axilla measuring 1.8 x 1.6 x 1.4 cm. - Biopsy on 11/01/2018 shows IDC, grade 3, ER/PR positive, Ki-67 70%, HER-2 2+ - Right breast needle biopsy on 12/02/2018 with benign breast parenchyma with fibrocystic change. - Left MRM on 01/04/2019, 3.2 cm IDC, grade 3, tumor focally present at the anterior margin, 1/7 lymph nodes positive, ER/PR positive, HER-2 negative by IHC.  HER-2 by fish testing on the biopsy also negative. -PET/CT scan on 12/01/2018 shows hypermetabolic left breast lesion associated with hypermetabolic left axillary lymphadenopathy.  No other metastatic disease was seen. -2D echocardiogram on 01/26/2019 shows ejection fraction of 60 to 65%. - 2 cycles of dose dense AC on 02/01/2019 and 02/15/2019. -She had nausea and dry heaves after cycle 1 which did not come back after cycle 2. - She is using Zofran 4 mg every 8 hours as needed. -She is working 2 days a week.  Did not have any side effects after cycle 2. -We reviewed her lab work.  She has neutropenia with white count of 1.8.  She apparently removed her on pro device after the first week.  She did not receive her Neulasta shot. - We have given her Neupogen injection today.  We will check a CBC tomorrow and treat her once ANC is more than 1500. -She will come back in 2 weeks for follow-up.  2.  Family history: -Sister reportedly died of metastatic breast cancer at age 97. -I would recommend genetic testing.  3.  Normocytic anemia: -Nutritional deficiency work-up including ferritin, iron panel, J49 and folic acid was normal.

## 2019-03-01 NOTE — Progress Notes (Signed)
Bridget Richardson, [place] [address]   CLINIC:  Medical Oncology/Hematology  PCP:  Bridget Police, MD [address], [address] [place] [address] [phone]   REASON FOR VISIT:  Follow-up for left breast cancer   BRIEF ONCOLOGIC HISTORY:  Oncology History  Malignant neoplasm of upper-outer quadrant of left breast in female, estrogen receptor positive ([facility])  01/04/2019 Initial Diagnosis   Malignant neoplasm of upper-outer quadrant of left breast in female, estrogen receptor positive ([facility])   01/19/2019 Cancer Staging   Staging form: Breast, AJCC 8th Edition - Pathologic stage from 01/19/2019: Stage IIA (pT2, pN1, cM0, G3, ER+, PR+, HER2-) - Signed by Derek Jack, MD on 01/19/2019   02/01/2019 -  Chemotherapy   The patient had DOXOrubicin (ADRIAMYCIN) chemo injection 120 mg, 60 mg/m2 = 120 mg, Intravenous,  Once, 2 of 4 cycles Administration: 120 mg (02/01/2019), 120 mg (02/15/2019) palonosetron (ALOXI) injection 0.25 mg, 0.25 mg, Intravenous,  Once, 2 of 4 cycles Administration: 0.25 mg (02/01/2019), 0.25 mg (02/15/2019) pegfilgrastim (NEULASTA ONPRO KIT) injection 6 mg, 6 mg, Subcutaneous, Once, 2 of 4 cycles Administration: 6 mg (02/01/2019), 6 mg (02/15/2019) cyclophosphamide (CYTOXAN) 1,200 mg in sodium chloride 0.9 % 250 mL chemo infusion, 600 mg/m2 = 1,200 mg, Intravenous,  Once, 2 of 4 cycles Administration: 1,200 mg (02/01/2019), 1,200 mg (02/15/2019) PACLitaxel (TAXOL) 162 mg in sodium chloride 0.9 % 250 mL chemo infusion (</= 49m/m2), 80 mg/m2, Intravenous,  Once, 0 of 12 cycles fosaprepitant (EMEND) 150 mg, dexamethasone (DECADRON) 12 mg in sodium chloride 0.9 % 145 mL IVPB, , Intravenous,  Once, 2 of 4 cycles Administration:  (02/01/2019),  (02/15/2019)  for chemotherapy treatment.       CANCER STAGING: Cancer Staging Malignant neoplasm of upper-outer quadrant of left breast in female, estrogen receptor positive ([place] Staging form:  Breast, AJCC 8th Edition - Clinical: No stage assigned - Unsigned - Pathologic stage from 01/19/2019: Stage IIA (pT2, pN1, cM0, G3, ER+, PR+, HER2-) - Signed by KDerek Jack MD on 01/19/2019    INTERVAL HISTORY:  Ms. WScherger63y.o. female seen for cycle 3 of chemotherapy.  Cycle 2 was on 02/15/2019.  Denied any nausea vomiting diarrhea or constipation after cycle 2.  Is able to work 2 days a week.  Denies any mucositis.  Denies any fevers or infections.  No ER visits or hospitalizations.  She apparently removed her Neulasta on pro device after the first week.  Did not experience any musculoskeletal pains.  Appetite and energy levels are 100%.   REVIEW OF SYSTEMS:  Review of Systems  All other systems reviewed and are negative.    PAST MEDICAL/SURGICAL HISTORY:  Past Medical History:  Diagnosis Date  . Coronary artery disease   . Diabetes mellitus without complication ([place]   . Hypertension    Past Surgical History:  Procedure Laterality Date  . ANKLE SURGERY Right 2009  . MASTECTOMY MODIFIED RADICAL Left 01/04/2019   Procedure: MASTECTOMY MODIFIED RADICAL;  Surgeon: JAviva Signs MD;  Location: [facility];  Service: General;  Laterality: Left;  . PORTACATH PLACEMENT Right 01/30/2019   Procedure: INSERTION PORT-A-CATH;  Surgeon: JAviva Signs MD;  Location: [facility];  Service: General;  Laterality: Right;  . WRIST SURGERY Right 1997     SOCIAL HISTORY:  Social History   Socioeconomic History  . Marital status: Divorced    Spouse name: Not on file  . Number of children: Not on file  . Years of education: Not on  file  . Highest education level: Not on file  Occupational History  . Not on file  Social Needs  . Financial resource strain: Not on file  . Food insecurity    Worry: Not on file    Inability: Not on file  . Transportation needs    Medical: Not on file    Non-medical: Not on file  Tobacco Use  . Smoking status: Former Smoker    Packs/day: 0.25    Years:  2.00    Pack years: 0.50    Types: Cigarettes    Quit date: 1983    Years since quitting: 37.5  . Smokeless tobacco: Never Used  Substance and Sexual Activity  . Alcohol use: Never    Frequency: Never  . Drug use: Never  . Sexual activity: Not Currently  Lifestyle  . Physical activity    Days per week: Not on file    Minutes per session: Not on file  . Stress: Not on file  Relationships  . Social Herbalist on phone: Not on file    Gets together: Not on file    Attends religious service: Not on file    Active member of club or organization: Not on file    Attends meetings of clubs or organizations: Not on file    Relationship status: Not on file  . Intimate partner violence    Fear of current or ex partner: Not on file    Emotionally abused: Not on file    Physically abused: Not on file    Forced sexual activity: Not on file  Other Topics Concern  . Not on file  Social History Narrative  . Not on file    FAMILY HISTORY:  History reviewed. No pertinent family history.  CURRENT MEDICATIONS:  Outpatient Encounter Medications as of 03/01/2019  Medication Sig  . atorvastatin (LIPITOR) 40 MG tablet Take 40 mg by mouth at bedtime.   . carvedilol (COREG) 6.25 MG tablet Take 6.25 mg by mouth every morning.   . CYCLOPHOSPHAMIDE IV Inject into the vein every 14 (fourteen) days.  Marland Kitchen dexamethasone (DECADRON) 4 MG tablet Take 8 mg by mouth 2 (two) times daily with a meal.  . DOXORUBICIN HCL IV Inject into the vein every 14 (fourteen) days.  . enalapril (VASOTEC) 20 MG tablet Take 20 mg by mouth 2 (two) times daily.  . ferrous sulfate 325 (65 FE) MG tablet Take 325 mg by mouth daily with breakfast.  . lidocaine-prilocaine (EMLA) cream Apply small amount to port a cath site and cover with plastic wrap 1 hour prior to chemotherapy appointments  . metFORMIN (GLUCOPHAGE) 850 MG tablet Take 850 mg by mouth daily with breakfast.   . ondansetron (ZOFRAN) 4 MG tablet Take 4 mg by  mouth every 8 (eight) hours as needed for nausea or vomiting.  Derrill Memo ON 03/29/2019] PACLITAXEL IV Inject into the vein once a week.  . prochlorperazine (COMPAZINE) 10 MG tablet Take 1 tablet (10 mg total) by mouth every 6 (six) hours as needed (Nausea or vomiting).  Marland Kitchen tiZANidine (ZANAFLEX) 2 MG tablet TAKE 1 TABLET ORALLY EVERY 8 HOURS AS NEEDED FOR BACK PAIN  . [DISCONTINUED] lidocaine-prilocaine (EMLA) cream Apply to affected area once  . [DISCONTINUED] prochlorperazine (COMPAZINE) 10 MG tablet Take 1 tablet (10 mg total) by mouth every 6 (six) hours as needed for nausea or vomiting.  . [EXPIRED] filgrastim (NEUPOGEN) injection 480 mcg    No facility-administered encounter medications on file  as of 03/01/2019.     ALLERGIES:  No Known Allergies   PHYSICAL EXAM:  ECOG Performance status: 0  Vitals:   03/01/19 0800  BP: (!) 167/76  Pulse: 95  Resp: 16  Temp: (!) 97.3 F (36.3 C)  SpO2: 99%   Filed Weights   03/01/19 0800  Weight: 183 lb 6 oz (83.2 kg)    Physical Exam Vitals signs reviewed.  Constitutional:      Appearance: Normal appearance.  Cardiovascular:     Rate and Rhythm: Normal rate and regular rhythm.     Heart sounds: Normal heart sounds.  Pulmonary:     Effort: Pulmonary effort is normal.     Breath sounds: Normal breath sounds.  Abdominal:     General: There is no distension.     Palpations: Abdomen is soft. There is no mass.  Musculoskeletal:        General: No swelling.  Skin:    General: Skin is warm.  Neurological:     General: No focal deficit present.     Mental Status: She is alert and oriented to person, place, and time.  Psychiatric:        Mood and Affect: Mood normal.        Behavior: Behavior normal.     LABORATORY DATA:  I have reviewed the labs as listed.  CBC    Component Value Date/Time   WBC 1.8 (L) 03/01/2019 0845   RBC 3.34 (L) 03/01/2019 0845   HGB 9.4 (L) 03/01/2019 0845   HCT 30.0 (L) 03/01/2019 0845   PLT 269  03/01/2019 0845   MCV 89.8 03/01/2019 0845   MCH 28.1 03/01/2019 0845   MCHC 31.3 03/01/2019 0845   RDW 14.6 03/01/2019 0845   LYMPHSABS 0.7 03/01/2019 0845   MONOABS 0.6 03/01/2019 0845   EOSABS 0.0 03/01/2019 0845   BASOSABS 0.0 03/01/2019 0845   CMP Latest Ref Rng & Units 03/01/2019 02/15/2019 02/01/2019  Glucose 70 - 99 mg/dL 137(H) 98 125(H)  BUN 8 - 23 mg/dL 8 20 11   Creatinine 0.44 - 1.00 mg/dL 0.58 0.61 0.56  Sodium 135 - 145 mmol/L 141 140 141  Potassium 3.5 - 5.1 mmol/L 3.9 4.0 4.1  Chloride 98 - 111 mmol/L 105 100 104  CO2 22 - 32 mmol/L 26 28 24   Calcium 8.9 - 10.3 mg/dL 9.5 9.4 9.7  Total Protein 6.5 - 8.1 g/dL 7.3 6.9 7.9  Total Bilirubin 0.3 - 1.2 mg/dL 0.5 0.5 0.3  Alkaline Phos 38 - 126 U/L 99 127(H) 120  AST 15 - 41 U/L 14(L) 11(L) 24  ALT 0 - 44 U/L 16 24 28        DIAGNOSTIC IMAGING:  I have independently reviewed the scans and discussed with the patient.    ASSESSMENT & PLAN:   Malignant neoplasm of upper-outer quadrant of left breast in female, estrogen receptor positive ([facility]) 1.  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  Mammogram on 10/25/2018 shows left breast 1 o'clock position, 2.5 x 2.2 x 2 centimeter mass with enlarged lymph node in the left axilla measuring 1.8 x 1.6 x 1.4 cm. - Biopsy on 11/01/2018 shows IDC, grade 3, ER/PR positive, Ki-67 70%, HER-2 2+ - Right breast needle biopsy on 12/02/2018 with benign breast parenchyma with fibrocystic change. - Left MRM on 01/04/2019, 3.2 cm IDC, grade 3, tumor focally present at the anterior margin, 1/7 lymph nodes positive, ER/PR positive,  HER-2 negative by IHC.  HER-2 by fish testing on the biopsy also negative. -PET/CT scan on 12/01/2018 shows hypermetabolic left breast lesion associated with hypermetabolic left axillary lymphadenopathy.  No other metastatic disease was seen. -2D echocardiogram on 01/26/2019 shows ejection fraction of 60 to 65%. - 2 cycles of dose  dense AC on 02/01/2019 and 02/15/2019. -She had nausea and dry heaves after cycle 1 which did not come back after cycle 2. - She is using Zofran 4 mg every 8 hours as needed. -She is working 2 days a week.  Did not have any side effects after cycle 2. -We reviewed her lab work.  She has neutropenia with white count of 1.8.  She apparently removed her on pro device after the first week.  She did not receive her Neulasta shot. - We have given her Neupogen injection today.  We will check a CBC tomorrow and treat her once ANC is more than 1500. -She will come back in 2 weeks for follow-up.  2.  Family history: -Sister reportedly died of metastatic breast cancer at age 51. -I would recommend genetic testing.  3.  Normocytic anemia: -Nutritional deficiency work-up including ferritin, iron panel, W72 and folic acid was normal.   Total time spent is 25 minutes with more than 50% of the time spent face-to-face discussing treatment plan, side effects and coordination of care.    Orders placed this encounter:  No orders of the defined types were placed in this encounter.     Derek Jack, MD [place] [phone]

## 2019-03-02 ENCOUNTER — Inpatient Hospital Stay (HOSPITAL_COMMUNITY): Payer: PRIVATE HEALTH INSURANCE

## 2019-03-02 ENCOUNTER — Encounter (HOSPITAL_COMMUNITY): Payer: Self-pay

## 2019-03-02 ENCOUNTER — Telehealth (HOSPITAL_COMMUNITY): Payer: Self-pay | Admitting: Hematology

## 2019-03-02 VITALS — BP 125/44 | HR 99 | Temp 97.1°F | Resp 18

## 2019-03-02 DIAGNOSIS — C50412 Malignant neoplasm of upper-outer quadrant of left female breast: Secondary | ICD-10-CM

## 2019-03-02 DIAGNOSIS — Z5111 Encounter for antineoplastic chemotherapy: Secondary | ICD-10-CM | POA: Diagnosis not present

## 2019-03-02 DIAGNOSIS — Z170 Estrogen receptor positive status [ER+]: Secondary | ICD-10-CM

## 2019-03-02 LAB — CBC WITH DIFFERENTIAL/PLATELET
Abs Immature Granulocytes: 0.08 10*3/uL — ABNORMAL HIGH (ref 0.00–0.07)
Basophils Absolute: 0 10*3/uL (ref 0.0–0.1)
Basophils Relative: 1 %
Eosinophils Absolute: 0 10*3/uL (ref 0.0–0.5)
Eosinophils Relative: 1 %
HCT: 31 % — ABNORMAL LOW (ref 36.0–46.0)
Hemoglobin: 9.5 g/dL — ABNORMAL LOW (ref 12.0–15.0)
Immature Granulocytes: 1 %
Lymphocytes Relative: 17 %
Lymphs Abs: 1 10*3/uL (ref 0.7–4.0)
MCH: 27.8 pg (ref 26.0–34.0)
MCHC: 30.6 g/dL (ref 30.0–36.0)
MCV: 90.6 fL (ref 80.0–100.0)
Monocytes Absolute: 1.6 10*3/uL — ABNORMAL HIGH (ref 0.1–1.0)
Monocytes Relative: 27 %
Neutro Abs: 3.1 10*3/uL (ref 1.7–7.7)
Neutrophils Relative %: 53 %
Platelets: 363 10*3/uL (ref 150–400)
RBC: 3.42 MIL/uL — ABNORMAL LOW (ref 3.87–5.11)
RDW: 14.8 % (ref 11.5–15.5)
WBC Morphology: INCREASED
WBC: 5.8 10*3/uL (ref 4.0–10.5)
nRBC: 0.5 % — ABNORMAL HIGH (ref 0.0–0.2)

## 2019-03-02 MED ORDER — HEPARIN SOD (PORK) LOCK FLUSH 100 UNIT/ML IV SOLN
500.0000 [IU] | Freq: Once | INTRAVENOUS | Status: AC | PRN
  Administered 2019-03-02: 15:00:00 500 [IU]

## 2019-03-02 MED ORDER — SODIUM CHLORIDE 0.9 % IV SOLN
600.0000 mg/m2 | Freq: Once | INTRAVENOUS | Status: AC
  Administered 2019-03-02: 1200 mg via INTRAVENOUS
  Filled 2019-03-02: qty 60

## 2019-03-02 MED ORDER — SODIUM CHLORIDE 0.9 % IV SOLN
Freq: Once | INTRAVENOUS | Status: AC
  Administered 2019-03-02: 13:00:00 via INTRAVENOUS

## 2019-03-02 MED ORDER — PALONOSETRON HCL INJECTION 0.25 MG/5ML
0.2500 mg | Freq: Once | INTRAVENOUS | Status: AC
  Administered 2019-03-02: 0.25 mg via INTRAVENOUS
  Filled 2019-03-02: qty 5

## 2019-03-02 MED ORDER — PEGFILGRASTIM 6 MG/0.6ML ~~LOC~~ PSKT
6.0000 mg | PREFILLED_SYRINGE | Freq: Once | SUBCUTANEOUS | Status: AC
  Administered 2019-03-02: 6 mg via SUBCUTANEOUS
  Filled 2019-03-02: qty 0.6

## 2019-03-02 MED ORDER — SODIUM CHLORIDE 0.9% FLUSH
10.0000 mL | INTRAVENOUS | Status: DC | PRN
  Administered 2019-03-02: 10 mL
  Filled 2019-03-02: qty 10

## 2019-03-02 MED ORDER — DOXORUBICIN HCL CHEMO IV INJECTION 2 MG/ML
60.0000 mg/m2 | Freq: Once | INTRAVENOUS | Status: AC
  Administered 2019-03-02: 120 mg via INTRAVENOUS
  Filled 2019-03-02: qty 60

## 2019-03-02 MED ORDER — SODIUM CHLORIDE 0.9 % IV SOLN
Freq: Once | INTRAVENOUS | Status: AC
  Administered 2019-03-02: 13:00:00 via INTRAVENOUS
  Filled 2019-03-02: qty 5

## 2019-03-02 NOTE — Telephone Encounter (Signed)
Enrolled pt in the [employer] and advised her of the [employer] guidelines of submitting an app. Advised her to bring in fin doc requested on the form.

## 2019-03-02 NOTE — Progress Notes (Signed)
Bridget Richardson tolerated chemo tx with Neulasta on-pro well without complaints or incident. Labs reviewed with [name] prior to administering chemotherapy.Neulasta on-pro applied to pt's right arm with green indicator light flashing upon discharge. VSS upon discharge. Pt discharged self ambulatory in satisfactory condition

## 2019-03-02 NOTE — Patient Instructions (Addendum)
[facility] Discharge Instructions for Patients Receiving Chemotherapy   Beginning January 23rd 2017 lab work for the [facility] will be done in the  Main lab at [facility] on 1st floor. If you have a lab appointment with the [facility] please come in thru the  Main Entrance and check in at the main information desk   Today you received the following chemotherapy agents Adriamycin and Cytoxan as well as Neulasta on-pro. Follow-up as scheduled. Call clinic for any questions or concerns  To help prevent nausea and vomiting after your treatment, we encourage you to take your nausea medication   If you develop nausea and vomiting, or diarrhea that is not controlled by your medication, call the clinic.  The clinic phone number is [phone]. Office hours are Monday-Friday 8:30am-5:00pm.  BELOW ARE SYMPTOMS THAT SHOULD BE REPORTED IMMEDIATELY:  *FEVER GREATER THAN 101.0 F  *CHILLS WITH OR WITHOUT FEVER  NAUSEA AND VOMITING THAT IS NOT CONTROLLED WITH YOUR NAUSEA MEDICATION  *UNUSUAL SHORTNESS OF BREATH  *UNUSUAL BRUISING OR BLEEDING  TENDERNESS IN MOUTH AND THROAT WITH OR WITHOUT PRESENCE OF ULCERS  *URINARY PROBLEMS  *BOWEL PROBLEMS  UNUSUAL RASH Items with * indicate a potential emergency and should be followed up as soon as possible. If you have an emergency after office hours please contact your primary care physician or go to the nearest emergency department.  Please call the clinic during office hours if you have any questions or concerns.   You may also contact the Patient Navigator at [phone] should you have any questions or need assistance in obtaining follow up care.      Resources For Cancer Patients and their Caregivers ? American Cancer Society: Can assist with transportation, wigs, general needs, runs Look Good Feel Better.        (224)226-0667 ? Cancer Care: Provides financial assistance, online support groups,  medication/co-pay assistance.  1-800-813-HOPE 857-491-3117) ? [place] Assists [place] cancer patients and their families through emotional , educational and financial support.  215-438-4311 ? [place] DSS Where to apply for food stamps, Medicaid and utility assistance. 212-813-0447 ? [facility]: Transportation to medical appointments. (859)266-6482 ? Social Security Administration: May apply for disability if have a Stage IV cancer. 650-506-1714 531-356-2434 ? [employer], Disability and Transit Services: Assists with nutrition, care and transit needs. [phone]

## 2019-03-13 ENCOUNTER — Encounter (HOSPITAL_COMMUNITY): Payer: Self-pay | Admitting: *Deleted

## 2019-03-15 ENCOUNTER — Inpatient Hospital Stay (HOSPITAL_BASED_OUTPATIENT_CLINIC_OR_DEPARTMENT_OTHER): Payer: PRIVATE HEALTH INSURANCE | Admitting: Hematology

## 2019-03-15 ENCOUNTER — Inpatient Hospital Stay (HOSPITAL_COMMUNITY): Payer: PRIVATE HEALTH INSURANCE

## 2019-03-15 ENCOUNTER — Encounter (HOSPITAL_COMMUNITY): Payer: Self-pay | Admitting: Hematology

## 2019-03-15 ENCOUNTER — Other Ambulatory Visit: Payer: Self-pay

## 2019-03-15 VITALS — BP 147/69 | HR 73 | Temp 97.3°F | Resp 18

## 2019-03-15 DIAGNOSIS — C50412 Malignant neoplasm of upper-outer quadrant of left female breast: Secondary | ICD-10-CM

## 2019-03-15 DIAGNOSIS — Z170 Estrogen receptor positive status [ER+]: Secondary | ICD-10-CM | POA: Diagnosis not present

## 2019-03-15 DIAGNOSIS — E119 Type 2 diabetes mellitus without complications: Secondary | ICD-10-CM

## 2019-03-15 DIAGNOSIS — Z5111 Encounter for antineoplastic chemotherapy: Secondary | ICD-10-CM | POA: Diagnosis not present

## 2019-03-15 DIAGNOSIS — Z7984 Long term (current) use of oral hypoglycemic drugs: Secondary | ICD-10-CM

## 2019-03-15 DIAGNOSIS — E876 Hypokalemia: Secondary | ICD-10-CM

## 2019-03-15 DIAGNOSIS — D649 Anemia, unspecified: Secondary | ICD-10-CM | POA: Diagnosis not present

## 2019-03-15 DIAGNOSIS — I10 Essential (primary) hypertension: Secondary | ICD-10-CM

## 2019-03-15 DIAGNOSIS — E785 Hyperlipidemia, unspecified: Secondary | ICD-10-CM

## 2019-03-15 DIAGNOSIS — D701 Agranulocytosis secondary to cancer chemotherapy: Secondary | ICD-10-CM | POA: Diagnosis not present

## 2019-03-15 DIAGNOSIS — Z87891 Personal history of nicotine dependence: Secondary | ICD-10-CM

## 2019-03-15 DIAGNOSIS — I2510 Atherosclerotic heart disease of native coronary artery without angina pectoris: Secondary | ICD-10-CM

## 2019-03-15 DIAGNOSIS — Z79899 Other long term (current) drug therapy: Secondary | ICD-10-CM

## 2019-03-15 DIAGNOSIS — Z803 Family history of malignant neoplasm of breast: Secondary | ICD-10-CM

## 2019-03-15 LAB — COMPREHENSIVE METABOLIC PANEL
ALT: 13 U/L (ref 0–44)
AST: 13 U/L — ABNORMAL LOW (ref 15–41)
Albumin: 3.6 g/dL (ref 3.5–5.0)
Alkaline Phosphatase: 132 U/L — ABNORMAL HIGH (ref 38–126)
Anion gap: 10 (ref 5–15)
BUN: 13 mg/dL (ref 8–23)
CO2: 29 mmol/L (ref 22–32)
Calcium: 9 mg/dL (ref 8.9–10.3)
Chloride: 104 mmol/L (ref 98–111)
Creatinine, Ser: 0.66 mg/dL (ref 0.44–1.00)
GFR calc Af Amer: >60 mL/min (ref >60)
GFR calc non Af Amer: >60 mL/min (ref >60)
Glucose, Bld: 123 mg/dL — ABNORMAL HIGH (ref 70–99)
Potassium: 3.3 mmol/L — ABNORMAL LOW (ref 3.5–5.1)
Sodium: 143 mmol/L (ref 135–145)
Total Bilirubin: 0.6 mg/dL (ref 0.3–1.2)
Total Protein: 6.6 g/dL (ref 6.5–8.1)

## 2019-03-15 LAB — CBC WITH DIFFERENTIAL/PLATELET
Abs Immature Granulocytes: 5.54 10*3/uL — ABNORMAL HIGH (ref 0.00–0.07)
Basophils Absolute: 0.1 10*3/uL (ref 0.0–0.1)
Basophils Relative: 0 %
Eosinophils Absolute: 0 10*3/uL (ref 0.0–0.5)
Eosinophils Relative: 0 %
HCT: 28.4 % — ABNORMAL LOW (ref 36.0–46.0)
Hemoglobin: 8.6 g/dL — ABNORMAL LOW (ref 12.0–15.0)
Immature Granulocytes: 22 %
Lymphocytes Relative: 5 %
Lymphs Abs: 1.3 10*3/uL (ref 0.7–4.0)
MCH: 27.3 pg (ref 26.0–34.0)
MCHC: 30.3 g/dL (ref 30.0–36.0)
MCV: 90.2 fL (ref 80.0–100.0)
Monocytes Absolute: 3 10*3/uL — ABNORMAL HIGH (ref 0.1–1.0)
Monocytes Relative: 12 %
Neutro Abs: 14.9 10*3/uL — ABNORMAL HIGH (ref 1.7–7.7)
Neutrophils Relative %: 61 %
Platelets: 142 10*3/uL — ABNORMAL LOW (ref 150–400)
RBC: 3.15 MIL/uL — ABNORMAL LOW (ref 3.87–5.11)
RDW: 15.5 % (ref 11.5–15.5)
WBC: 24.8 10*3/uL — ABNORMAL HIGH (ref 4.0–10.5)
nRBC: 1.3 % — ABNORMAL HIGH (ref 0.0–0.2)

## 2019-03-15 MED ORDER — SODIUM CHLORIDE 0.9% FLUSH
10.0000 mL | INTRAVENOUS | Status: DC | PRN
  Administered 2019-03-15: 09:00:00 10 mL
  Filled 2019-03-15: qty 10

## 2019-03-15 MED ORDER — PEGFILGRASTIM 6 MG/0.6ML ~~LOC~~ PSKT
6.0000 mg | PREFILLED_SYRINGE | Freq: Once | SUBCUTANEOUS | Status: AC
  Administered 2019-03-15: 12:00:00 6 mg via SUBCUTANEOUS
  Filled 2019-03-15: qty 0.6

## 2019-03-15 MED ORDER — HEPARIN SOD (PORK) LOCK FLUSH 100 UNIT/ML IV SOLN
500.0000 [IU] | Freq: Once | INTRAVENOUS | Status: AC | PRN
  Administered 2019-03-15: 13:00:00 500 [IU]

## 2019-03-15 MED ORDER — SODIUM CHLORIDE 0.9 % IV SOLN
Freq: Once | INTRAVENOUS | Status: AC
  Administered 2019-03-15: 10:00:00 via INTRAVENOUS
  Filled 2019-03-15: qty 5

## 2019-03-15 MED ORDER — SODIUM CHLORIDE 0.9 % IV SOLN
Freq: Once | INTRAVENOUS | Status: AC
  Administered 2019-03-15: 10:00:00 via INTRAVENOUS

## 2019-03-15 MED ORDER — SODIUM CHLORIDE 0.9 % IV SOLN
600.0000 mg/m2 | Freq: Once | INTRAVENOUS | Status: AC
  Administered 2019-03-15: 1200 mg via INTRAVENOUS
  Filled 2019-03-15: qty 60

## 2019-03-15 MED ORDER — PALONOSETRON HCL INJECTION 0.25 MG/5ML
0.2500 mg | Freq: Once | INTRAVENOUS | Status: AC
  Administered 2019-03-15: 0.25 mg via INTRAVENOUS
  Filled 2019-03-15: qty 5

## 2019-03-15 MED ORDER — POTASSIUM CHLORIDE CRYS ER 20 MEQ PO TBCR
20.0000 meq | EXTENDED_RELEASE_TABLET | Freq: Once | ORAL | Status: AC
  Administered 2019-03-15: 20 meq via ORAL
  Filled 2019-03-15: qty 1

## 2019-03-15 MED ORDER — DOXORUBICIN HCL CHEMO IV INJECTION 2 MG/ML
60.0000 mg/m2 | Freq: Once | INTRAVENOUS | Status: AC
  Administered 2019-03-15: 120 mg via INTRAVENOUS
  Filled 2019-03-15: qty 60

## 2019-03-15 NOTE — Patient Instructions (Signed)
[facility] at [facility] Discharge Instructions  You were seen today by Dr. Katragadda. He went over your recent lab results. He will see you back in 2 weeks for labs and follow up.   Thank you for choosing [facility] at [facility] to provide your oncology and hematology care.  To afford each patient quality time with our provider, please arrive at least 15 minutes before your scheduled appointment time.   If you have a lab appointment with the Cancer Center please come in thru the  Main Entrance and check in at the main information desk  You need to re-schedule your appointment should you arrive 10 or more minutes late.  We strive to give you quality time with our providers, and arriving late affects you and other patients whose appointments are after yours.  Also, if you no show three or more times for appointments you may be dismissed from the clinic at the providers discretion.     Again, thank you for choosing [facility].  Our hope is that these requests will decrease the amount of time that you wait before being seen by our physicians.       _____________________________________________________________  Should you have questions after your visit to [facility], please contact our office at [phone] between the hours of 8:00 a.m. and 4:30 p.m.  Voicemails left after 4:00 p.m. will not be returned until the following business day.  For prescription refill requests, have your pharmacy contact our office and allow 72 hours.    Cancer Center Support Programs:   > Cancer Support Group  2nd Tuesday of the month 1pm-2pm, Journey Room    

## 2019-03-15 NOTE — Progress Notes (Signed)
Patient seen by [name] with lab review and potassium 3.3 with verbal order ok to treat today.   Patient tolerated chemotherapy with no complaints voiced.  Port site clean and dry with no bruising or swelling noted at site.  Good blood return noted before and after administration of chemotherapy.  Neulasta Onpro applied with green indicator light flashing.  Band aid applied.  Patient left ambulatory with VSS and no s/s of distress noted.

## 2019-03-15 NOTE — Assessment & Plan Note (Signed)
1.  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 - 3 cycles of dose dense AC from 02/01/2019 through 03/02/2019. -She has tolerated her last cycle very well.  Denied any symptoms or signs of PND or orthopnea. - We reviewed her blood work today.  She may proceed with her cycle 4 today. -She will come back in 2 weeks for follow-up.  She will start weekly paclitaxel at that time.  2.  Family history: -Sister had metastatic breast cancer at age 24.  I have recommended genetic testing.

## 2019-03-15 NOTE — Progress Notes (Signed)
[place], [place] [address]   CLINIC:  Medical Oncology/Hematology  PCP:  [name], MD [address] [place] [address] [phone]   REASON FOR VISIT:  Follow-up for left breast cancer   BRIEF ONCOLOGIC HISTORY:  Oncology History  Malignant neoplasm of upper-outer quadrant of left breast in female, estrogen receptor positive ([place])  01/04/2019 Initial Diagnosis   Malignant neoplasm of upper-outer quadrant of left breast in female, estrogen receptor positive ([place])   01/19/2019 Cancer Staging   Staging form: Breast, AJCC 8th Edition - Pathologic stage from 01/19/2019: Stage IIA (pT2, pN1, cM0, G3, ER+, PR+, HER2-) - Signed by Derek Jack, MD on 01/19/2019   02/01/2019 -  Chemotherapy   The patient had DOXOrubicin (ADRIAMYCIN) chemo injection 120 mg, 60 mg/m2 = 120 mg, Intravenous,  Once, 4 of 4 cycles Administration: 120 mg (02/01/2019), 120 mg (02/15/2019), 120 mg (03/02/2019), 120 mg (03/15/2019) palonosetron (ALOXI) injection 0.25 mg, 0.25 mg, Intravenous,  Once, 4 of 4 cycles Administration: 0.25 mg (02/01/2019), 0.25 mg (02/15/2019), 0.25 mg (03/02/2019), 0.25 mg (03/15/2019) pegfilgrastim (NEULASTA ONPRO KIT) injection 6 mg, 6 mg, Subcutaneous, Once, 4 of 4 cycles Administration: 6 mg (02/01/2019), 6 mg (02/15/2019), 6 mg (03/02/2019), 6 mg (03/15/2019) cyclophosphamide (CYTOXAN) 1,200 mg in sodium chloride 0.9 % 250 mL chemo infusion, 600 mg/m2 = 1,200 mg, Intravenous,  Once, 4 of 4 cycles Administration: 1,200 mg (02/01/2019), 1,200 mg (02/15/2019), 1,200 mg (03/02/2019), 1,200 mg (03/15/2019) PACLitaxel (TAXOL) 162 mg in sodium chloride 0.9 % 250 mL chemo infusion (</= 38m/m2), 80 mg/m2, Intravenous,  Once, 0 of 12 cycles fosaprepitant (EMEND) 150 mg, dexamethasone (DECADRON) 12 mg in sodium chloride 0.9 % 145 mL IVPB, , Intravenous,  Once, 4 of 4 cycles Administration:  (02/01/2019),  (02/15/2019),  (03/02/2019),  (03/15/2019)  for  chemotherapy treatment.       CANCER STAGING: Cancer Staging Malignant neoplasm of upper-outer quadrant of left breast in female, estrogen receptor positive ([place] Staging form: Breast, AJCC 8th Edition - Clinical: No stage assigned - Unsigned - Pathologic stage from 01/19/2019: Stage IIA (pT2, pN1, cM0, G3, ER+, PR+, HER2-) - Signed by KDerek Jack MD on 01/19/2019    INTERVAL HISTORY:  Ms. [id number].[id number]. female seen for cycle 4 of chemotherapy.  Cycle 3 was on 03/01/2019.  Denies any nausea vomiting diarrhea or constipation after last cycle.  Denies any major tiredness.  She is continuing to work twice a week.  Denies any fevers or chills.  No ER visits or hospitalizations.  No bleeding per rectum or melena.  Denies any bone pains associated with Neulasta injection.  Appetite and energy levels are 75%.  REVIEW OF SYSTEMS:  Review of Systems  All other systems reviewed and are negative.    PAST MEDICAL/SURGICAL HISTORY:  Past Medical History:  Diagnosis Date  . Coronary artery disease   . Diabetes mellitus without complication ([place]   . Hypertension    Past Surgical History:  Procedure Laterality Date  . ANKLE SURGERY Right 2009  . MASTECTOMY MODIFIED RADICAL Left 01/04/2019   Procedure: MASTECTOMY MODIFIED RADICAL;  Surgeon: JAviva Signs MD;  Location: [facility];  Service: General;  Laterality: Left;  . PORTACATH PLACEMENT Right 01/30/2019   Procedure: INSERTION PORT-A-CATH;  Surgeon: JAviva Signs MD;  Location: [facility];  Service: General;  Laterality: Right;  . WRIST SURGERY Right 1997     SOCIAL HISTORY:  Social History   Socioeconomic History  . Marital status:  Divorced    Spouse name: Not on file  . Number of children: Not on file  . Years of education: Not on file  . Highest education level: Not on file  Occupational History  . Not on file  Social Needs  . Financial resource strain: Not on file  . Food insecurity    Worry: Not on file    Inability: Not  on file  . Transportation needs    Medical: Not on file    Non-medical: Not on file  Tobacco Use  . Smoking status: Former Smoker    Packs/day: 0.25    Years: 2.00    Pack years: 0.50    Types: Cigarettes    Quit date: 1983    Years since quitting: 37.5  . Smokeless tobacco: Never Used  Substance and Sexual Activity  . Alcohol use: Never    Frequency: Never  . Drug use: Never  . Sexual activity: Not Currently  Lifestyle  . Physical activity    Days per week: Not on file    Minutes per session: Not on file  . Stress: Not on file  Relationships  . Social Herbalist on phone: Not on file    Gets together: Not on file    Attends religious service: Not on file    Active member of club or organization: Not on file    Attends meetings of clubs or organizations: Not on file    Relationship status: Not on file  . Intimate partner violence    Fear of current or ex partner: Not on file    Emotionally abused: Not on file    Physically abused: Not on file    Forced sexual activity: Not on file  Other Topics Concern  . Not on file  Social History Narrative  . Not on file    FAMILY HISTORY:  No family history on file.  CURRENT MEDICATIONS:  Outpatient Encounter Medications as of 03/15/2019  Medication Sig  . atorvastatin (LIPITOR) 40 MG tablet Take 40 mg by mouth at bedtime.   . carvedilol (COREG) 6.25 MG tablet Take 6.25 mg by mouth every morning.   . CYCLOPHOSPHAMIDE IV Inject into the vein every 14 (fourteen) days.  Marland Kitchen dexamethasone (DECADRON) 4 MG tablet Take 8 mg by mouth 2 (two) times daily with a meal.  . DOXORUBICIN HCL IV Inject into the vein every 14 (fourteen) days.  . enalapril (VASOTEC) 20 MG tablet Take 20 mg by mouth 2 (two) times daily.  . ferrous sulfate 325 (65 FE) MG tablet Take 325 mg by mouth daily with breakfast.  . lidocaine-prilocaine (EMLA) cream Apply small amount to port a cath site and cover with plastic wrap 1 hour prior to chemotherapy  appointments  . metFORMIN (GLUCOPHAGE) 850 MG tablet Take 850 mg by mouth daily with breakfast.   . ondansetron (ZOFRAN) 4 MG tablet Take 4 mg by mouth every 8 (eight) hours as needed for nausea or vomiting.  Derrill Memo ON 03/29/2019] PACLITAXEL IV Inject into the vein once a week.  . prochlorperazine (COMPAZINE) 10 MG tablet Take 1 tablet (10 mg total) by mouth every 6 (six) hours as needed (Nausea or vomiting).  Marland Kitchen tiZANidine (ZANAFLEX) 4 MG tablet TAKE 1 TABLET BY MOUTH THREE TIMES A DAY AS NEEDED FOR FLANK PAIN  . [DISCONTINUED] tiZANidine (ZANAFLEX) 2 MG tablet TAKE 1 TABLET ORALLY EVERY 8 HOURS AS NEEDED FOR BACK PAIN   No facility-administered encounter medications on file as  of 03/15/2019.     ALLERGIES:  No Known Allergies   PHYSICAL EXAM:  ECOG Performance status: 0  Vitals:   03/15/19 0905  BP: (!) 163/75  Pulse: 78  Resp: 18  Temp: (!) 97.3 F (36.3 C)  SpO2: 100%   Filed Weights   03/15/19 0905  Weight: 182 lb 3.2 oz (82.6 kg)    Physical Exam Vitals signs reviewed.  Constitutional:      Appearance: Normal appearance.  Cardiovascular:     Rate and Rhythm: Normal rate and regular rhythm.     Heart sounds: Normal heart sounds.  Pulmonary:     Effort: Pulmonary effort is normal.     Breath sounds: Normal breath sounds.  Abdominal:     General: There is no distension.     Palpations: Abdomen is soft. There is no mass.  Musculoskeletal:        General: No swelling.  Skin:    General: Skin is warm.  Neurological:     General: No focal deficit present.     Mental Status: She is alert and oriented to person, place, and time.  Psychiatric:        Mood and Affect: Mood normal.        Behavior: Behavior normal.     LABORATORY DATA:  I have reviewed the labs as listed.  CBC    Component Value Date/Time   WBC 24.8 (H) 03/15/2019 0903   RBC 3.15 (L) 03/15/2019 0903   HGB 8.6 (L) 03/15/2019 0903   HCT 28.4 (L) 03/15/2019 0903   PLT 142 (L) 03/15/2019 0903    MCV 90.2 03/15/2019 0903   MCH 27.3 03/15/2019 0903   MCHC 30.3 03/15/2019 0903   RDW 15.5 03/15/2019 0903   LYMPHSABS 1.3 03/15/2019 0903   MONOABS 3.0 (H) 03/15/2019 0903   EOSABS 0.0 03/15/2019 0903   BASOSABS 0.1 03/15/2019 0903   CMP Latest Ref Rng & Units 03/15/2019 03/01/2019 02/15/2019  Glucose 70 - 99 mg/dL 123(H) 137(H) 98  BUN 8 - 23 mg/dL 13 8 20   Creatinine 0.44 - 1.00 mg/dL 0.66 0.58 0.61  Sodium 135 - 145 mmol/L 143 141 140  Potassium 3.5 - 5.1 mmol/L 3.3(L) 3.9 4.0  Chloride 98 - 111 mmol/L 104 105 100  CO2 22 - 32 mmol/L 29 26 28   Calcium 8.9 - 10.3 mg/dL 9.0 9.5 9.4  Total Protein 6.5 - 8.1 g/dL 6.6 7.3 6.9  Total Bilirubin 0.3 - 1.2 mg/dL 0.6 0.5 0.5  Alkaline Phos 38 - 126 U/L 132(H) 99 127(H)  AST 15 - 41 U/L 13(L) 14(L) 11(L)  ALT 0 - 44 U/L 13 16 24        DIAGNOSTIC IMAGING:  I have independently reviewed the scans and discussed with the patient.    ASSESSMENT & PLAN:   Malignant neoplasm of upper-outer quadrant of left breast in female, estrogen receptor positive ([place]) 1.  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 - 3 cycles of dose dense AC from 02/01/2019 through 03/02/2019. -She has tolerated her last cycle very well.  Denied any symptoms or signs of PND or orthopnea. - We reviewed her blood work today.  She may proceed with her cycle 4 today. -She will come back in 2 weeks for follow-up.  She will start  weekly paclitaxel at that time.  2.  Family history: -Sister had metastatic breast cancer at age 37.  I have recommended genetic testing.  Total time spent is 25 minutes with more than 50% of the time spent face-to-face discussing treatment plan, side effects and coordination of care.    Orders placed this encounter:  No orders of the defined types  were placed in this encounter.     Derek Jack, MD [place] [phone]

## 2019-03-15 NOTE — Progress Notes (Signed)
03/15/19  Potassium level 3.3  Received order to give Potassium chloride 20 meq oral x 1 dose prior to chemotherapy.  Order entered as requested.  T.O. Dr Beckey Downing LPN/Adamae Ricklefs Ronnald Ramp, PharmD

## 2019-03-30 ENCOUNTER — Other Ambulatory Visit: Payer: Self-pay

## 2019-03-30 ENCOUNTER — Inpatient Hospital Stay (HOSPITAL_COMMUNITY): Payer: PRIVATE HEALTH INSURANCE | Attending: Hematology

## 2019-03-30 ENCOUNTER — Inpatient Hospital Stay (HOSPITAL_COMMUNITY): Payer: PRIVATE HEALTH INSURANCE

## 2019-03-30 ENCOUNTER — Encounter (HOSPITAL_COMMUNITY): Payer: Self-pay | Admitting: Hematology

## 2019-03-30 ENCOUNTER — Inpatient Hospital Stay (HOSPITAL_BASED_OUTPATIENT_CLINIC_OR_DEPARTMENT_OTHER): Payer: PRIVATE HEALTH INSURANCE | Admitting: Hematology

## 2019-03-30 VITALS — BP 145/69 | HR 80 | Temp 98.6°F | Resp 17

## 2019-03-30 DIAGNOSIS — Z170 Estrogen receptor positive status [ER+]: Secondary | ICD-10-CM

## 2019-03-30 DIAGNOSIS — Z803 Family history of malignant neoplasm of breast: Secondary | ICD-10-CM | POA: Insufficient documentation

## 2019-03-30 DIAGNOSIS — Z5111 Encounter for antineoplastic chemotherapy: Secondary | ICD-10-CM | POA: Diagnosis present

## 2019-03-30 DIAGNOSIS — D649 Anemia, unspecified: Secondary | ICD-10-CM | POA: Insufficient documentation

## 2019-03-30 DIAGNOSIS — C50412 Malignant neoplasm of upper-outer quadrant of left female breast: Secondary | ICD-10-CM

## 2019-03-30 DIAGNOSIS — Z87891 Personal history of nicotine dependence: Secondary | ICD-10-CM | POA: Diagnosis not present

## 2019-03-30 DIAGNOSIS — Z79899 Other long term (current) drug therapy: Secondary | ICD-10-CM | POA: Diagnosis not present

## 2019-03-30 LAB — COMPREHENSIVE METABOLIC PANEL
ALT: 13 U/L (ref 0–44)
AST: 14 U/L — ABNORMAL LOW (ref 15–41)
Albumin: 3.7 g/dL (ref 3.5–5.0)
Alkaline Phosphatase: 105 U/L (ref 38–126)
Anion gap: 8 (ref 5–15)
BUN: 7 mg/dL — ABNORMAL LOW (ref 8–23)
CO2: 25 mmol/L (ref 22–32)
Calcium: 9.1 mg/dL (ref 8.9–10.3)
Chloride: 106 mmol/L (ref 98–111)
Creatinine, Ser: 0.62 mg/dL (ref 0.44–1.00)
GFR calc Af Amer: >60 mL/min (ref >60)
GFR calc non Af Amer: >60 mL/min (ref >60)
Glucose, Bld: 132 mg/dL — ABNORMAL HIGH (ref 70–99)
Potassium: 3.5 mmol/L (ref 3.5–5.1)
Sodium: 139 mmol/L (ref 135–145)
Total Bilirubin: 0.3 mg/dL (ref 0.3–1.2)
Total Protein: 6.7 g/dL (ref 6.5–8.1)

## 2019-03-30 LAB — CBC WITH DIFFERENTIAL/PLATELET
Abs Immature Granulocytes: 1.22 10*3/uL — ABNORMAL HIGH (ref 0.00–0.07)
Basophils Absolute: 0.1 10*3/uL (ref 0.0–0.1)
Basophils Relative: 0 %
Eosinophils Absolute: 0 10*3/uL (ref 0.0–0.5)
Eosinophils Relative: 0 %
HCT: 26.6 % — ABNORMAL LOW (ref 36.0–46.0)
Hemoglobin: 8 g/dL — ABNORMAL LOW (ref 12.0–15.0)
Immature Granulocytes: 9 %
Lymphocytes Relative: 7 %
Lymphs Abs: 0.9 10*3/uL (ref 0.7–4.0)
MCH: 27.9 pg (ref 26.0–34.0)
MCHC: 30.1 g/dL (ref 30.0–36.0)
MCV: 92.7 fL (ref 80.0–100.0)
Monocytes Absolute: 1.6 10*3/uL — ABNORMAL HIGH (ref 0.1–1.0)
Monocytes Relative: 12 %
Neutro Abs: 9.3 10*3/uL — ABNORMAL HIGH (ref 1.7–7.7)
Neutrophils Relative %: 72 %
Platelets: 299 10*3/uL (ref 150–400)
RBC: 2.87 MIL/uL — ABNORMAL LOW (ref 3.87–5.11)
RDW: 18.1 % — ABNORMAL HIGH (ref 11.5–15.5)
WBC: 13.1 10*3/uL — ABNORMAL HIGH (ref 4.0–10.5)
nRBC: 1.5 % — ABNORMAL HIGH (ref 0.0–0.2)

## 2019-03-30 MED ORDER — SODIUM CHLORIDE 0.9% FLUSH
10.0000 mL | INTRAVENOUS | Status: DC | PRN
  Administered 2019-03-30: 10 mL
  Filled 2019-03-30: qty 10

## 2019-03-30 MED ORDER — HEPARIN SOD (PORK) LOCK FLUSH 100 UNIT/ML IV SOLN
500.0000 [IU] | Freq: Once | INTRAVENOUS | Status: AC | PRN
  Administered 2019-03-30: 14:00:00 500 [IU]

## 2019-03-30 MED ORDER — DIPHENHYDRAMINE HCL 50 MG/ML IJ SOLN
50.0000 mg | Freq: Once | INTRAMUSCULAR | Status: AC
  Administered 2019-03-30: 10:00:00 50 mg via INTRAVENOUS
  Filled 2019-03-30: qty 1

## 2019-03-30 MED ORDER — SODIUM CHLORIDE 0.9 % IV SOLN
80.0000 mg/m2 | Freq: Once | INTRAVENOUS | Status: AC
  Administered 2019-03-30: 12:00:00 162 mg via INTRAVENOUS
  Filled 2019-03-30: qty 27

## 2019-03-30 MED ORDER — SODIUM CHLORIDE 0.9 % IV SOLN
Freq: Once | INTRAVENOUS | Status: AC
  Administered 2019-03-30: 10:00:00 via INTRAVENOUS

## 2019-03-30 MED ORDER — FAMOTIDINE IN NACL 20-0.9 MG/50ML-% IV SOLN
20.0000 mg | Freq: Once | INTRAVENOUS | Status: AC
  Administered 2019-03-30: 10:00:00 20 mg via INTRAVENOUS
  Filled 2019-03-30: qty 50

## 2019-03-30 MED ORDER — SODIUM CHLORIDE 0.9 % IV SOLN
20.0000 mg | Freq: Once | INTRAVENOUS | Status: AC
  Administered 2019-03-30: 11:00:00 20 mg via INTRAVENOUS
  Filled 2019-03-30: qty 20

## 2019-03-30 NOTE — Patient Instructions (Addendum)
[facility] at [facility] Discharge Instructions  You were seen today by Dr. Katragadda. He went over your recent lab results. He will see you back in 1 week for labs and follow up.   Thank you for choosing [facility] at [facility] to provide your oncology and hematology care.  To afford each patient quality time with our provider, please arrive at least 15 minutes before your scheduled appointment time.   If you have a lab appointment with the Cancer Center please come in thru the  Main Entrance and check in at the main information desk  You need to re-schedule your appointment should you arrive 10 or more minutes late.  We strive to give you quality time with our providers, and arriving late affects you and other patients whose appointments are after yours.  Also, if you no show three or more times for appointments you may be dismissed from the clinic at the providers discretion.     Again, thank you for choosing [facility].  Our hope is that these requests will decrease the amount of time that you wait before being seen by our physicians.       _____________________________________________________________  Should you have questions after your visit to [facility], please contact our office at [phone] between the hours of 8:00 a.m. and 4:30 p.m.  Voicemails left after 4:00 p.m. will not be returned until the following business day.  For prescription refill requests, have your pharmacy contact our office and allow 72 hours.    Cancer Center Support Programs:   > Cancer Support Group  2nd Tuesday of the month 1pm-2pm, Journey Room    

## 2019-03-30 NOTE — Assessment & Plan Note (Signed)
1.  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 -4 cycles of dose dense AC from 02/01/2019 through 03/15/2019. -She tolerated her last cycle very well.  Denies any symptoms of PND or orthopnea. -She is continuing to work 2 days a week. -We discussed starting her on paclitaxel weekly at this time.  We discussed various side effects including nail changes, neuropathy, allergic reactions in detail.  She will proceed with her first dose today.  I will see her back in 1 week to see how she tolerated.  After that we will switch her to every 2-week visits.  2.  Normocytic anemia: -She has a hemoglobin of 8.0.  This has gradually come down since the start of chemotherapy. -This is most likely from bone marrow suppression.  This will likely improve with single agent paclitaxel.  3.  Family history: -Sister had metastatic breast cancer at age 37.  I have recommended genetic testing.

## 2019-03-30 NOTE — Progress Notes (Signed)
[place], [place] [address]   CLINIC:  Medical Oncology/Hematology  PCP:  [name], MD [address] [place] [address] [phone]   REASON FOR VISIT:  Follow-up for left breast cancer   BRIEF ONCOLOGIC HISTORY:  Oncology History  Malignant neoplasm of upper-outer quadrant of left breast in female, estrogen receptor positive ([place])  01/04/2019 Initial Diagnosis   Malignant neoplasm of upper-outer quadrant of left breast in female, estrogen receptor positive ([place])   01/19/2019 Cancer Staging   Staging form: Breast, AJCC 8th Edition - Pathologic stage from 01/19/2019: Stage IIA (pT2, pN1, cM0, G3, ER+, PR+, HER2-) - Signed by Derek Jack, MD on 01/19/2019   02/01/2019 -  Chemotherapy   The patient had DOXOrubicin (ADRIAMYCIN) chemo injection 120 mg, 60 mg/m2 = 120 mg, Intravenous,  Once, 4 of 4 cycles Administration: 120 mg (02/01/2019), 120 mg (02/15/2019), 120 mg (03/02/2019), 120 mg (03/15/2019) palonosetron (ALOXI) injection 0.25 mg, 0.25 mg, Intravenous,  Once, 4 of 4 cycles Administration: 0.25 mg (02/01/2019), 0.25 mg (02/15/2019), 0.25 mg (03/02/2019), 0.25 mg (03/15/2019) pegfilgrastim (NEULASTA ONPRO KIT) injection 6 mg, 6 mg, Subcutaneous, Once, 4 of 4 cycles Administration: 6 mg (02/01/2019), 6 mg (02/15/2019), 6 mg (03/02/2019), 6 mg (03/15/2019) cyclophosphamide (CYTOXAN) 1,200 mg in sodium chloride 0.9 % 250 mL chemo infusion, 600 mg/m2 = 1,200 mg, Intravenous,  Once, 4 of 4 cycles Administration: 1,200 mg (02/01/2019), 1,200 mg (02/15/2019), 1,200 mg (03/02/2019), 1,200 mg (03/15/2019) PACLitaxel (TAXOL) 162 mg in sodium chloride 0.9 % 250 mL chemo infusion (</= 25m/m2), 80 mg/m2 = 162 mg, Intravenous,  Once, 0 of 12 cycles fosaprepitant (EMEND) 150 mg, dexamethasone (DECADRON) 12 mg in sodium chloride 0.9 % 145 mL IVPB, , Intravenous,  Once, 4 of 4 cycles Administration:  (02/01/2019),  (02/15/2019),  (03/02/2019),   (03/15/2019)  for chemotherapy treatment.       CANCER STAGING: Cancer Staging Malignant neoplasm of upper-outer quadrant of left breast in female, estrogen receptor positive ([place] Staging form: Breast, AJCC 8th Edition - Clinical: No stage assigned - Unsigned - Pathologic stage from 01/19/2019: Stage IIA (pT2, pN1, cM0, G3, ER+, PR+, HER2-) - Signed by Bridget Jack MD on 01/19/2019    INTERVAL HISTORY:  Ms. [name] seen for breast cancer and next cycle of chemotherapy.  She received fourth cycle of AC on 03/15/2019.  Denies any mucositis.  Denies any nausea, vomiting or diarrhea or constipation.  She is continuing to work 2 days a week.  Appetite is 100%.  Energy levels are 75%.  No fevers or chills reported.  Denies any ER visits or hospitalizations.  No bleeding per rectum or melena.  Denies any dizziness or chest pains.  REVIEW OF SYSTEMS:  Review of Systems  All other systems reviewed and are negative.    PAST MEDICAL/SURGICAL HISTORY:  Past Medical History:  Diagnosis Date  . Coronary artery disease   . Diabetes mellitus without complication ([place]   . Hypertension    Past Surgical History:  Procedure Laterality Date  . ANKLE SURGERY Right 2009  . MASTECTOMY MODIFIED RADICAL Left 01/04/2019   Procedure: MASTECTOMY MODIFIED RADICAL;  Surgeon: Bridget Signs MD;  Location: [facility];  Service: General;  Laterality: Left;  . PORTACATH PLACEMENT Right 01/30/2019   Procedure: INSERTION PORT-A-CATH;  Surgeon: Bridget Signs MD;  Location: [facility];  Service: General;  Laterality: Right;  . WRIST SURGERY Right 1997     SOCIAL HISTORY:  Social History  Socioeconomic History  . Marital status: Divorced    Spouse name: Not on file  . Number of children: Not on file  . Years of education: Not on file  . Highest education level: Not on file  Occupational History  . Not on file  Social Needs  . Financial resource strain: Not on file  . Food insecurity     Worry: Not on file    Inability: Not on file  . Transportation needs    Medical: Not on file    Non-medical: Not on file  Tobacco Use  . Smoking status: Former Smoker    Packs/day: 0.25    Years: 2.00    Pack years: 0.50    Types: Cigarettes    Quit date: 1983    Years since quitting: 37.6  . Smokeless tobacco: Never Used  Substance and Sexual Activity  . Alcohol use: Never    Frequency: Never  . Drug use: Never  . Sexual activity: Not Currently  Lifestyle  . Physical activity    Days per week: Not on file    Minutes per session: Not on file  . Stress: Not on file  Relationships  . Social Herbalist on phone: Not on file    Gets together: Not on file    Attends religious service: Not on file    Active member of club or organization: Not on file    Attends meetings of clubs or organizations: Not on file    Relationship status: Not on file  . Intimate partner violence    Fear of current or ex partner: Not on file    Emotionally abused: Not on file    Physically abused: Not on file    Forced sexual activity: Not on file  Other Topics Concern  . Not on file  Social History Narrative  . Not on file    FAMILY HISTORY:  History reviewed. No pertinent family history.  CURRENT MEDICATIONS:  Outpatient Encounter Medications as of 03/30/2019  Medication Sig  . atorvastatin (LIPITOR) 40 MG tablet Take 40 mg by mouth at bedtime.   . carvedilol (COREG) 6.25 MG tablet Take 6.25 mg by mouth every morning.   . CYCLOPHOSPHAMIDE IV Inject into the vein every 14 (fourteen) days.  Marland Kitchen dexamethasone (DECADRON) 4 MG tablet Take 8 mg by mouth 2 (two) times daily with a meal.  . DOXORUBICIN HCL IV Inject into the vein every 14 (fourteen) days.  . enalapril (VASOTEC) 20 MG tablet Take 20 mg by mouth 2 (two) times daily.  . ferrous sulfate 325 (65 FE) MG tablet Take 325 mg by mouth daily with breakfast.  . lidocaine-prilocaine (EMLA) cream Apply small amount to port a cath site and  cover with plastic wrap 1 hour prior to chemotherapy appointments  . metFORMIN (GLUCOPHAGE) 850 MG tablet Take 850 mg by mouth daily with breakfast.   . ondansetron (ZOFRAN) 4 MG tablet Take 4 mg by mouth every 8 (eight) hours as needed for nausea or vomiting.  Marland Kitchen PACLITAXEL IV Inject into the vein once a week.  . prochlorperazine (COMPAZINE) 10 MG tablet Take 1 tablet (10 mg total) by mouth every 6 (six) hours as needed (Nausea or vomiting).  Marland Kitchen tiZANidine (ZANAFLEX) 4 MG tablet TAKE 1 TABLET BY MOUTH THREE TIMES A DAY AS NEEDED FOR FLANK PAIN   No facility-administered encounter medications on file as of 03/30/2019.     ALLERGIES:  No Known Allergies   PHYSICAL EXAM:  ECOG Performance status: 0  Vitals:   03/30/19 0843  BP: (!) 156/83  Pulse: 96  Resp: 16  Temp: (!) 97.3 F (36.3 C)  SpO2: 99%   Filed Weights   03/30/19 0843  Weight: 178 lb 1.6 oz (80.8 kg)    Physical Exam Vitals [place] reviewed.  Constitutional:      Appearance: Normal appearance.  Cardiovascular:     Rate and Rhythm: Normal rate and regular rhythm.     Heart sounds: Normal heart sounds.  Pulmonary:     Effort: Pulmonary effort is normal.     Breath sounds: Normal breath sounds.  Abdominal:     General: There is no distension.     Palpations: Abdomen is soft. There is no mass.  Musculoskeletal:        General: No swelling.  Skin:    General: Skin is warm.  Neurological:     General: No focal deficit present.     Mental Status: She is alert and oriented to person, place, and time.  Psychiatric:        Mood and Affect: Mood normal.        Behavior: Behavior normal.     LABORATORY DATA:  I have reviewed the labs as listed.  CBC    Component Value Date/Time   WBC 13.1 (H) 03/30/2019 0854   RBC 2.87 (L) 03/30/2019 0854   HGB 8.0 (L) 03/30/2019 0854   HCT 26.6 (L) 03/30/2019 0854   PLT 299 03/30/2019 0854   MCV 92.7 03/30/2019 0854   MCH 27.9 03/30/2019 0854   MCHC 30.1 03/30/2019 0854    RDW 18.1 (H) 03/30/2019 0854   LYMPHSABS PENDING 03/30/2019 0854   MONOABS PENDING 03/30/2019 0854   EOSABS PENDING 03/30/2019 0854   BASOSABS PENDING 03/30/2019 0854   CMP Latest Ref Rng & Units 03/30/2019 03/15/2019 03/01/2019  Glucose 70 - 99 mg/dL 132(H) 123(H) 137(H)  BUN 8 - 23 mg/dL 7(L) 13 8  Creatinine 0.44 - 1.00 mg/dL 0.62 0.66 0.58  Sodium 135 - 145 mmol/L 139 143 141  Potassium 3.5 - 5.1 mmol/L 3.5 3.3(L) 3.9  Chloride 98 - 111 mmol/L 106 104 105  CO2 22 - 32 mmol/L 25 29 26   Calcium 8.9 - 10.3 mg/dL 9.1 9.0 9.5  Total Protein 6.5 - 8.1 g/dL 6.7 6.6 7.3  Total Bilirubin 0.3 - 1.2 mg/dL 0.3 0.6 0.5  Alkaline Phos 38 - 126 U/L 105 132(H) 99  AST 15 - 41 U/L 14(L) 13(L) 14(L)  ALT 0 - 44 U/L 13 13 16        DIAGNOSTIC IMAGING:  I have independently reviewed the scans and discussed with the patient.    ASSESSMENT & PLAN:   Malignant neoplasm of upper-outer quadrant of left breast in female, estrogen receptor positive ([place]) 1.  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 -4 cycles of dose dense AC from 02/01/2019 through 03/15/2019. -She tolerated her last cycle very well.  Denies any symptoms of PND or orthopnea. -She is continuing to work 2 days a week. -We discussed starting her on paclitaxel weekly at this time.  We discussed various side effects including nail changes, neuropathy, allergic reactions in detail.  She will proceed with her first dose today.  I will see her back  in 1 week to see how she tolerated.  After that we will switch her to every 2-week visits.  2.  Normocytic anemia: -She has a hemoglobin of 8.0.  This has gradually come down since the start of chemotherapy. -This is most likely from bone marrow suppression.  This will likely improve with single  agent paclitaxel.  3.  Family history: -Sister had metastatic breast cancer at age 26.  I have recommended genetic testing.  Total time spent is 25 minutes with more than 50% of the time spent face-to-face discussing treatment plan, side effects and coordination of care.    Orders placed this encounter:  No orders of the defined types were placed in this encounter.     Derek Jack, MD [place] [phone]

## 2019-03-30 NOTE — Patient Instructions (Signed)
[facility] Discharge Instructions for Patients Receiving Chemotherapy  Today you received the following chemotherapy agents   To help prevent nausea and vomiting after your treatment, we encourage you to take your nausea medication   If you develop nausea and vomiting that is not controlled by your nausea medication, call the clinic.   BELOW ARE SYMPTOMS THAT SHOULD BE REPORTED IMMEDIATELY:  *FEVER GREATER THAN 100.5 F  *CHILLS WITH OR WITHOUT FEVER  NAUSEA AND VOMITING THAT IS NOT CONTROLLED WITH YOUR NAUSEA MEDICATION  *UNUSUAL SHORTNESS OF BREATH  *UNUSUAL BRUISING OR BLEEDING  TENDERNESS IN MOUTH AND THROAT WITH OR WITHOUT PRESENCE OF ULCERS  *URINARY PROBLEMS  *BOWEL PROBLEMS  UNUSUAL RASH Items with * indicate a potential emergency and should be followed up as soon as possible.  Feel free to call the clinic should you have any questions or concerns. The clinic phone number is [phone].  Please show the CHEMO ALERT CARD at check-in to the Emergency Department and triage nurse.   

## 2019-03-30 NOTE — Progress Notes (Signed)
Pt presents today for treatment and f/u appointment with [name]. VS within parameters for treatment. Labs pending.   Treatment given today per MD orders. Tolerated infusion without adverse affects. Vital signs stable. No complaints at this time. Discharged from clinic ambulatory. F/U with [facility] as scheduled.

## 2019-04-06 ENCOUNTER — Inpatient Hospital Stay (HOSPITAL_COMMUNITY): Payer: PRIVATE HEALTH INSURANCE

## 2019-04-06 ENCOUNTER — Encounter (HOSPITAL_COMMUNITY): Payer: Self-pay

## 2019-04-06 ENCOUNTER — Other Ambulatory Visit: Payer: Self-pay

## 2019-04-06 VITALS — BP 148/67 | HR 79 | Temp 97.9°F | Resp 18 | Wt 180.0 lb

## 2019-04-06 DIAGNOSIS — Z170 Estrogen receptor positive status [ER+]: Secondary | ICD-10-CM

## 2019-04-06 DIAGNOSIS — C50412 Malignant neoplasm of upper-outer quadrant of left female breast: Secondary | ICD-10-CM

## 2019-04-06 DIAGNOSIS — Z5111 Encounter for antineoplastic chemotherapy: Secondary | ICD-10-CM | POA: Diagnosis not present

## 2019-04-06 LAB — CBC WITH DIFFERENTIAL/PLATELET
Abs Immature Granulocytes: 0.18 10*3/uL — ABNORMAL HIGH (ref 0.00–0.07)
Basophils Absolute: 0.1 10*3/uL (ref 0.0–0.1)
Basophils Relative: 1 %
Eosinophils Absolute: 0.1 10*3/uL (ref 0.0–0.5)
Eosinophils Relative: 1 %
HCT: 24.4 % — ABNORMAL LOW (ref 36.0–46.0)
Hemoglobin: 7.3 g/dL — ABNORMAL LOW (ref 12.0–15.0)
Immature Granulocytes: 2 %
Lymphocytes Relative: 14 %
Lymphs Abs: 1.1 10*3/uL (ref 0.7–4.0)
MCH: 28.1 pg (ref 26.0–34.0)
MCHC: 29.9 g/dL — ABNORMAL LOW (ref 30.0–36.0)
MCV: 93.8 fL (ref 80.0–100.0)
Monocytes Absolute: 1.3 10*3/uL — ABNORMAL HIGH (ref 0.1–1.0)
Monocytes Relative: 16 %
Neutro Abs: 5.2 10*3/uL (ref 1.7–7.7)
Neutrophils Relative %: 66 %
Platelets: 418 10*3/uL — ABNORMAL HIGH (ref 150–400)
RBC: 2.6 MIL/uL — ABNORMAL LOW (ref 3.87–5.11)
RDW: 19.2 % — ABNORMAL HIGH (ref 11.5–15.5)
WBC: 7.9 10*3/uL (ref 4.0–10.5)
nRBC: 2.3 % — ABNORMAL HIGH (ref 0.0–0.2)

## 2019-04-06 LAB — COMPREHENSIVE METABOLIC PANEL
ALT: 16 U/L (ref 0–44)
AST: 15 U/L (ref 15–41)
Albumin: 3.5 g/dL (ref 3.5–5.0)
Alkaline Phosphatase: 69 U/L (ref 38–126)
Anion gap: 9 (ref 5–15)
BUN: 17 mg/dL (ref 8–23)
CO2: 26 mmol/L (ref 22–32)
Calcium: 9.4 mg/dL (ref 8.9–10.3)
Chloride: 107 mmol/L (ref 98–111)
Creatinine, Ser: 0.56 mg/dL (ref 0.44–1.00)
GFR calc Af Amer: >60 mL/min (ref >60)
GFR calc non Af Amer: >60 mL/min (ref >60)
Glucose, Bld: 111 mg/dL — ABNORMAL HIGH (ref 70–99)
Potassium: 3.4 mmol/L — ABNORMAL LOW (ref 3.5–5.1)
Sodium: 142 mmol/L (ref 135–145)
Total Bilirubin: 0.3 mg/dL (ref 0.3–1.2)
Total Protein: 6.2 g/dL — ABNORMAL LOW (ref 6.5–8.1)

## 2019-04-06 MED ORDER — SODIUM CHLORIDE 0.9 % IV SOLN
Freq: Once | INTRAVENOUS | Status: AC
  Administered 2019-04-06: 10:00:00 via INTRAVENOUS

## 2019-04-06 MED ORDER — SODIUM CHLORIDE 0.9 % IV SOLN
80.0000 mg/m2 | Freq: Once | INTRAVENOUS | Status: AC
  Administered 2019-04-06: 12:00:00 162 mg via INTRAVENOUS
  Filled 2019-04-06: qty 27

## 2019-04-06 MED ORDER — DIPHENHYDRAMINE HCL 50 MG/ML IJ SOLN
25.0000 mg | Freq: Once | INTRAMUSCULAR | Status: AC
  Administered 2019-04-06: 11:00:00 25 mg via INTRAVENOUS
  Filled 2019-04-06: qty 1

## 2019-04-06 MED ORDER — FAMOTIDINE IN NACL 20-0.9 MG/50ML-% IV SOLN
20.0000 mg | Freq: Once | INTRAVENOUS | Status: AC
  Administered 2019-04-06: 11:00:00 20 mg via INTRAVENOUS
  Filled 2019-04-06: qty 50

## 2019-04-06 MED ORDER — SODIUM CHLORIDE 0.9 % IV SOLN
20.0000 mg | Freq: Once | INTRAVENOUS | Status: AC
  Administered 2019-04-06: 11:00:00 20 mg via INTRAVENOUS
  Filled 2019-04-06: qty 20

## 2019-04-06 MED ORDER — SODIUM CHLORIDE 0.9% FLUSH
10.0000 mL | INTRAVENOUS | Status: DC | PRN
  Administered 2019-04-06: 13:00:00 10 mL
  Filled 2019-04-06: qty 10

## 2019-04-06 MED ORDER — HEPARIN SOD (PORK) LOCK FLUSH 100 UNIT/ML IV SOLN
500.0000 [IU] | Freq: Once | INTRAVENOUS | Status: AC | PRN
  Administered 2019-04-06: 13:00:00 500 [IU]

## 2019-04-06 NOTE — Progress Notes (Signed)
Pt presents today for treatment. Labs pending. VS within parameters for treatment. Pt has no complaints of any changes or pain today. Pt requested that the Benadryl 50mg  IV dose pre-med for treatment be reduced due to her last treatment the Benadryl made her feel strange. Pt teaching performed. Message sent to RNester NP pertaining to patient's request.   Treatment given today per MD orders. Tolerated infusion without adverse affects. Vital signs stable. No complaints at this time. Discharged from clinic ambulatory. F/U with [facility] as scheduled.

## 2019-04-06 NOTE — Patient Instructions (Signed)
[facility] Discharge Instructions for Patients Receiving Chemotherapy  Today you received the following chemotherapy agents   To help prevent nausea and vomiting after your treatment, we encourage you to take your nausea medication   If you develop nausea and vomiting that is not controlled by your nausea medication, call the clinic.   BELOW ARE SYMPTOMS THAT SHOULD BE REPORTED IMMEDIATELY:  *FEVER GREATER THAN 100.5 F  *CHILLS WITH OR WITHOUT FEVER  NAUSEA AND VOMITING THAT IS NOT CONTROLLED WITH YOUR NAUSEA MEDICATION  *UNUSUAL SHORTNESS OF BREATH  *UNUSUAL BRUISING OR BLEEDING  TENDERNESS IN MOUTH AND THROAT WITH OR WITHOUT PRESENCE OF ULCERS  *URINARY PROBLEMS  *BOWEL PROBLEMS  UNUSUAL RASH Items with * indicate a potential emergency and should be followed up as soon as possible.  Feel free to call the clinic should you have any questions or concerns. The clinic phone number is [phone].  Please show the CHEMO ALERT CARD at check-in to the Emergency Department and triage nurse.   

## 2019-04-07 ENCOUNTER — Telehealth (HOSPITAL_COMMUNITY): Payer: Self-pay | Admitting: Hematology

## 2019-04-07 NOTE — Telephone Encounter (Signed)
Pt was DENIED for assist from [employer]/neulasta. Per [employer] pt is eligible for medicaid.

## 2019-04-13 ENCOUNTER — Inpatient Hospital Stay (HOSPITAL_COMMUNITY): Payer: PRIVATE HEALTH INSURANCE

## 2019-04-13 ENCOUNTER — Inpatient Hospital Stay (HOSPITAL_BASED_OUTPATIENT_CLINIC_OR_DEPARTMENT_OTHER): Payer: PRIVATE HEALTH INSURANCE | Admitting: Hematology

## 2019-04-13 ENCOUNTER — Other Ambulatory Visit: Payer: Self-pay

## 2019-04-13 ENCOUNTER — Encounter (HOSPITAL_COMMUNITY): Payer: Self-pay | Admitting: Hematology

## 2019-04-13 VITALS — BP 111/55 | HR 87 | Temp 97.8°F | Resp 18

## 2019-04-13 DIAGNOSIS — Z170 Estrogen receptor positive status [ER+]: Secondary | ICD-10-CM | POA: Diagnosis not present

## 2019-04-13 DIAGNOSIS — C50412 Malignant neoplasm of upper-outer quadrant of left female breast: Secondary | ICD-10-CM

## 2019-04-13 DIAGNOSIS — Z5111 Encounter for antineoplastic chemotherapy: Secondary | ICD-10-CM | POA: Diagnosis not present

## 2019-04-13 LAB — CBC WITH DIFFERENTIAL/PLATELET
Abs Immature Granulocytes: 0.09 10*3/uL — ABNORMAL HIGH (ref 0.00–0.07)
Basophils Absolute: 0.1 10*3/uL (ref 0.0–0.1)
Basophils Relative: 1 %
Eosinophils Absolute: 0.2 10*3/uL (ref 0.0–0.5)
Eosinophils Relative: 4 %
HCT: 26.9 % — ABNORMAL LOW (ref 36.0–46.0)
Hemoglobin: 8.1 g/dL — ABNORMAL LOW (ref 12.0–15.0)
Immature Granulocytes: 2 %
Lymphocytes Relative: 11 %
Lymphs Abs: 0.7 10*3/uL (ref 0.7–4.0)
MCH: 28.7 pg (ref 26.0–34.0)
MCHC: 30.1 g/dL (ref 30.0–36.0)
MCV: 95.4 fL (ref 80.0–100.0)
Monocytes Absolute: 0.9 10*3/uL (ref 0.1–1.0)
Monocytes Relative: 14 %
Neutro Abs: 4.2 10*3/uL (ref 1.7–7.7)
Neutrophils Relative %: 68 %
Platelets: 248 10*3/uL (ref 150–400)
RBC: 2.82 MIL/uL — ABNORMAL LOW (ref 3.87–5.11)
RDW: 19.6 % — ABNORMAL HIGH (ref 11.5–15.5)
WBC: 6.2 10*3/uL (ref 4.0–10.5)
nRBC: 0 % (ref 0.0–0.2)

## 2019-04-13 LAB — COMPREHENSIVE METABOLIC PANEL
ALT: 21 U/L (ref 0–44)
AST: 15 U/L (ref 15–41)
Albumin: 3.6 g/dL (ref 3.5–5.0)
Alkaline Phosphatase: 71 U/L (ref 38–126)
Anion gap: 8 (ref 5–15)
BUN: 17 mg/dL (ref 8–23)
CO2: 26 mmol/L (ref 22–32)
Calcium: 9.1 mg/dL (ref 8.9–10.3)
Chloride: 105 mmol/L (ref 98–111)
Creatinine, Ser: 0.57 mg/dL (ref 0.44–1.00)
GFR calc Af Amer: >60 mL/min (ref >60)
GFR calc non Af Amer: >60 mL/min (ref >60)
Glucose, Bld: 112 mg/dL — ABNORMAL HIGH (ref 70–99)
Potassium: 3.6 mmol/L (ref 3.5–5.1)
Sodium: 139 mmol/L (ref 135–145)
Total Bilirubin: 0.3 mg/dL (ref 0.3–1.2)
Total Protein: 6.5 g/dL (ref 6.5–8.1)

## 2019-04-13 MED ORDER — SODIUM CHLORIDE 0.9 % IV SOLN
80.0000 mg/m2 | Freq: Once | INTRAVENOUS | Status: AC
  Administered 2019-04-13: 11:00:00 162 mg via INTRAVENOUS
  Filled 2019-04-13: qty 27

## 2019-04-13 MED ORDER — SODIUM CHLORIDE 0.9 % IV SOLN
20.0000 mg | Freq: Once | INTRAVENOUS | Status: AC
  Administered 2019-04-13: 10:00:00 20 mg via INTRAVENOUS
  Filled 2019-04-13: qty 20

## 2019-04-13 MED ORDER — HEPARIN SOD (PORK) LOCK FLUSH 100 UNIT/ML IV SOLN
500.0000 [IU] | Freq: Once | INTRAVENOUS | Status: AC | PRN
  Administered 2019-04-13: 13:00:00 500 [IU]

## 2019-04-13 MED ORDER — SODIUM CHLORIDE 0.9% FLUSH
10.0000 mL | INTRAVENOUS | Status: DC | PRN
  Administered 2019-04-13: 13:00:00 10 mL
  Filled 2019-04-13: qty 10

## 2019-04-13 MED ORDER — DIPHENHYDRAMINE HCL 50 MG/ML IJ SOLN
25.0000 mg | Freq: Once | INTRAMUSCULAR | Status: AC
  Administered 2019-04-13: 10:00:00 25 mg via INTRAVENOUS

## 2019-04-13 MED ORDER — FAMOTIDINE IN NACL 20-0.9 MG/50ML-% IV SOLN
INTRAVENOUS | Status: AC
  Filled 2019-04-13: qty 50

## 2019-04-13 MED ORDER — DIPHENHYDRAMINE HCL 50 MG/ML IJ SOLN
INTRAMUSCULAR | Status: AC
  Filled 2019-04-13: qty 1

## 2019-04-13 MED ORDER — FAMOTIDINE IN NACL 20-0.9 MG/50ML-% IV SOLN
20.0000 mg | Freq: Once | INTRAVENOUS | Status: AC
  Administered 2019-04-13: 10:00:00 20 mg via INTRAVENOUS

## 2019-04-13 MED ORDER — SODIUM CHLORIDE 0.9 % IV SOLN
Freq: Once | INTRAVENOUS | Status: AC
  Administered 2019-04-13: 10:00:00 via INTRAVENOUS

## 2019-04-13 NOTE — Progress Notes (Signed)
Pt presents today for treatment and follow up with [name]. VS within parameters for treatment. MAR reviewed. Labs pending. Pt has no complaints of any changes since the last visit. No complaints of any pain.   Proceed with treatment order received via instant message by RNester NP.   Treatment given today per MD orders. Tolerated infusion without adverse affects. Vital signs stable. No complaints at this time. Discharged from clinic ambulatory. F/U with [facility] as scheduled.

## 2019-04-13 NOTE — Progress Notes (Signed)
[place], [place] [address]   CLINIC:  Medical Oncology/Hematology  PCP:  [name], MD [address], [address] [place] [address] [phone]   REASON FOR VISIT: Follow-up for left breast cancer.  CURRENT THERAPY: Weekly paclitaxel.  BRIEF ONCOLOGIC HISTORY:  Oncology History  Malignant neoplasm of upper-outer quadrant of left breast in female, estrogen receptor positive ([place])  01/04/2019 Initial Diagnosis   Malignant neoplasm of upper-outer quadrant of left breast in female, estrogen receptor positive ([place])   01/19/2019 Cancer Staging   Staging form: Breast, AJCC 8th Edition - Pathologic stage from 01/19/2019: Stage IIA (pT2, pN1, cM0, G3, ER+, PR+, HER2-) - Signed by Derek Jack, MD on 01/19/2019   02/01/2019 -  Chemotherapy   The patient had DOXOrubicin (ADRIAMYCIN) chemo injection 120 mg, 60 mg/m2 = 120 mg, Intravenous,  Once, 4 of 4 cycles Administration: 120 mg (02/01/2019), 120 mg (02/15/2019), 120 mg (03/02/2019), 120 mg (03/15/2019) palonosetron (ALOXI) injection 0.25 mg, 0.25 mg, Intravenous,  Once, 4 of 4 cycles Administration: 0.25 mg (02/01/2019), 0.25 mg (02/15/2019), 0.25 mg (03/02/2019), 0.25 mg (03/15/2019) pegfilgrastim (NEULASTA ONPRO KIT) injection 6 mg, 6 mg, Subcutaneous, Once, 4 of 4 cycles Administration: 6 mg (02/01/2019), 6 mg (02/15/2019), 6 mg (03/02/2019), 6 mg (03/15/2019) cyclophosphamide (CYTOXAN) 1,200 mg in sodium chloride 0.9 % 250 mL chemo infusion, 600 mg/m2 = 1,200 mg, Intravenous,  Once, 4 of 4 cycles Administration: 1,200 mg (02/01/2019), 1,200 mg (02/15/2019), 1,200 mg (03/02/2019), 1,200 mg (03/15/2019) PACLitaxel (TAXOL) 162 mg in sodium chloride 0.9 % 250 mL chemo infusion (</= 78m/m2), 80 mg/m2 = 162 mg, Intravenous,  Once, 3 of 12 cycles Administration: 162 mg (03/30/2019), 162 mg (04/06/2019), 162 mg (04/13/2019) fosaprepitant (EMEND) 150 mg, dexamethasone (DECADRON) 12 mg in sodium chloride 0.9 % 145 mL  IVPB, , Intravenous,  Once, 4 of 4 cycles Administration:  (02/01/2019),  (02/15/2019),  (03/02/2019),  (03/15/2019)  for chemotherapy treatment.       CANCER STAGING: Cancer Staging Malignant neoplasm of upper-outer quadrant of left breast in female, estrogen receptor positive ([place] Staging form: Breast, AJCC 8th Edition - Clinical: No stage assigned - Unsigned - Pathologic stage from 01/19/2019: Stage IIA (pT2, pN1, cM0, G3, ER+, PR+, HER2-) - Signed by KDerek Jack MD on 01/19/2019    INTERVAL HISTORY:  [name]63y.o. female seen for follow-up of left breast cancer.  She was started on weekly paclitaxel on 03/30/2019.  She has tolerated very well.  Denies any nausea, vomiting, diarrhea or constipation.  Appetite is 100%.  Energy levels are 75%.  Denies any tingling or numbness in extremities.  Denies any allergic reactions.  Continues to work 2 to 3 days/week.  No ER visits or hospitalizations.  No fevers or chills reported.    REVIEW OF SYSTEMS:  Review of Systems  All other systems reviewed and are negative.    PAST MEDICAL/SURGICAL HISTORY:  Past Medical History:  Diagnosis Date  . Coronary artery disease   . Diabetes mellitus without complication ([place]   . Hypertension    Past Surgical History:  Procedure Laterality Date  . ANKLE SURGERY Right 2009  . MASTECTOMY MODIFIED RADICAL Left 01/04/2019   Procedure: MASTECTOMY MODIFIED RADICAL;  Surgeon: JAviva Signs MD;  Location: [facility];  Service: General;  Laterality: Left;  . PORTACATH PLACEMENT Right 01/30/2019   Procedure: INSERTION PORT-A-CATH;  Surgeon: JAviva Signs MD;  Location: [facility];  Service: General;  Laterality: Right;  . WRIST SURGERY Right  1997     SOCIAL HISTORY:  Social History   Socioeconomic History  . Marital status: Divorced    Spouse name: Not on file  . Number of children: Not on file  . Years of education: Not on file  . Highest education level: Not on file  Occupational History  .  Not on file  Social Needs  . Financial resource strain: Not on file  . Food insecurity    Worry: Not on file    Inability: Not on file  . Transportation needs    Medical: Not on file    Non-medical: Not on file  Tobacco Use  . Smoking status: Former Smoker    Packs/day: 0.25    Years: 2.00    Pack years: 0.50    Types: Cigarettes    Quit date: 1983    Years since quitting: 37.6  . Smokeless tobacco: Never Used  Substance and Sexual Activity  . Alcohol use: Never    Frequency: Never  . Drug use: Never  . Sexual activity: Not Currently  Lifestyle  . Physical activity    Days per week: Not on file    Minutes per session: Not on file  . Stress: Not on file  Relationships  . Social Herbalist on phone: Not on file    Gets together: Not on file    Attends religious service: Not on file    Active member of club or organization: Not on file    Attends meetings of clubs or organizations: Not on file    Relationship status: Not on file  . Intimate partner violence    Fear of current or ex partner: Not on file    Emotionally abused: Not on file    Physically abused: Not on file    Forced sexual activity: Not on file  Other Topics Concern  . Not on file  Social History Narrative  . Not on file    FAMILY HISTORY:  History reviewed. No pertinent family history.  CURRENT MEDICATIONS:  Outpatient Encounter Medications as of 04/13/2019  Medication Sig  . atorvastatin (LIPITOR) 40 MG tablet Take 40 mg by mouth at bedtime.   . carvedilol (COREG) 6.25 MG tablet Take 6.25 mg by mouth every morning.   . CYCLOPHOSPHAMIDE IV Inject into the vein every 14 (fourteen) days.  Marland Kitchen dexamethasone (DECADRON) 4 MG tablet Take 8 mg by mouth 2 (two) times daily with a meal.  . DOXORUBICIN HCL IV Inject into the vein every 14 (fourteen) days.  . enalapril (VASOTEC) 20 MG tablet Take 20 mg by mouth 2 (two) times daily.  . ferrous sulfate 325 (65 FE) MG tablet Take 325 mg by mouth daily  with breakfast.  . lidocaine-prilocaine (EMLA) cream Apply small amount to port a cath site and cover with plastic wrap 1 hour prior to chemotherapy appointments  . metFORMIN (GLUCOPHAGE) 850 MG tablet Take 850 mg by mouth daily with breakfast.   . ondansetron (ZOFRAN) 4 MG tablet Take 4 mg by mouth every 8 (eight) hours as needed for nausea or vomiting.  Marland Kitchen PACLITAXEL IV Inject into the vein once a week.  . prochlorperazine (COMPAZINE) 10 MG tablet Take 1 tablet (10 mg total) by mouth every 6 (six) hours as needed (Nausea or vomiting).  Marland Kitchen tiZANidine (ZANAFLEX) 4 MG tablet TAKE 1 TABLET BY MOUTH THREE TIMES A DAY AS NEEDED FOR FLANK PAIN   No facility-administered encounter medications on file as of 04/13/2019.  ALLERGIES:  No Known Allergies   PHYSICAL EXAM:  ECOG Performance status: 0  Vitals:   04/13/19 0816  BP: 126/62  Pulse: 84  Resp: 18  Temp: (!) 97.3 F (36.3 C)  SpO2: 98%   Filed Weights   04/13/19 0816  Weight: 177 lb 12.8 oz (80.6 kg)    Physical Exam Vitals signs reviewed.  Constitutional:      Appearance: Normal appearance.  Cardiovascular:     Rate and Rhythm: Normal rate and regular rhythm.     Heart sounds: Normal heart sounds.  Pulmonary:     Effort: Pulmonary effort is normal.     Breath sounds: Normal breath sounds.  Abdominal:     General: There is no distension.     Palpations: Abdomen is soft. There is no mass.  Musculoskeletal:        General: No swelling.  Lymphadenopathy:     Cervical: No cervical adenopathy.  Skin:    General: Skin is warm.  Neurological:     General: No focal deficit present.     Mental Status: She is alert and oriented to person, place, and time.  Psychiatric:        Mood and Affect: Mood normal.        Behavior: Behavior normal.      LABORATORY DATA:  I have reviewed the labs as listed.  CBC    Component Value Date/Time   WBC 6.2 04/13/2019 0749   RBC 2.82 (L) 04/13/2019 0749   HGB 8.1 (L) 04/13/2019  0749   HCT 26.9 (L) 04/13/2019 0749   PLT 248 04/13/2019 0749   MCV 95.4 04/13/2019 0749   MCH 28.7 04/13/2019 0749   MCHC 30.1 04/13/2019 0749   RDW 19.6 (H) 04/13/2019 0749   LYMPHSABS 0.7 04/13/2019 0749   MONOABS 0.9 04/13/2019 0749   EOSABS 0.2 04/13/2019 0749   BASOSABS 0.1 04/13/2019 0749   CMP Latest Ref Rng & Units 04/13/2019 04/06/2019 03/30/2019  Glucose 70 - 99 mg/dL 112(H) 111(H) 132(H)  BUN 8 - 23 mg/dL 17 17 7(L)  Creatinine 0.44 - 1.00 mg/dL 0.57 0.56 0.62  Sodium 135 - 145 mmol/L 139 142 139  Potassium 3.5 - 5.1 mmol/L 3.6 3.4(L) 3.5  Chloride 98 - 111 mmol/L 105 107 106  CO2 22 - 32 mmol/L _0 Calcium 8.9 - 10.3 mg/dL 9.1 9.4 9.1  Total Protein 6.5 - 8.1 g/dL 6.5 6.2(L) 6.7  Total Bilirubin 0.3 - 1.2 mg/dL 0.3 0.3 0.3  Alkaline Phos 38 - 126 U/L 71 69 105  AST 15 - 41 U/L 15 15 14(L)  ALT 0 - 44 U/L _1 DIAGNOSTIC IMAGING:  I have independently reviewed the scans and discussed with the patient.   I have reviewed Francene Finders, NP's note and agree with the documentation.  I personally performed a face-to-face visit, made revisions and my assessment and plan is as follows.    ASSESSMENT & PLAN:   Malignant neoplasm of upper-outer quadrant of left breast in female, estrogen receptor positive ([place]) 1.  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 -4 cycles of dose dense AC from 02/01/2019 through 03/15/2019. - Weekly paclitaxel started on 03/30/2019.  She is tolerating paclitaxel very  well.  No neuropathy. -She will continue with her treatment today.  I have reviewed her labs.  We will see her every other week for toxicity assessment.  2.  Normocytic anemia: - This is from myelosuppression from chemotherapy.  Hemoglobin today is 8.1.  No  transfusion needed.  3.  Family history: -Sister had metastatic breast cancer at age 2.  We have recommended genetic testing.   Total time spent is 25 minutes with more than 50% of the time spent face-to-face discussing treatment plan, counseling and coordination of care.  Orders placed this encounter:  No orders of the defined types were placed in this encounter.     Derek Jack, MD [place] [phone]

## 2019-04-13 NOTE — Patient Instructions (Signed)
[name] Discharge Instructions for Patients Receiving Chemotherapy  Today you received the following chemotherapy agents   To help prevent nausea and vomiting after your treatment, we encourage you to take your nausea medication   If you develop nausea and vomiting that is not controlled by your nausea medication, call the clinic.   BELOW ARE SYMPTOMS THAT SHOULD BE REPORTED IMMEDIATELY:  *FEVER GREATER THAN 100.5 F  *CHILLS WITH OR WITHOUT FEVER  NAUSEA AND VOMITING THAT IS NOT CONTROLLED WITH YOUR NAUSEA MEDICATION  *UNUSUAL SHORTNESS OF BREATH  *UNUSUAL BRUISING OR BLEEDING  TENDERNESS IN MOUTH AND THROAT WITH OR WITHOUT PRESENCE OF ULCERS  *URINARY PROBLEMS  *BOWEL PROBLEMS  UNUSUAL RASH Items with * indicate a potential emergency and should be followed up as soon as possible.  Feel free to call the clinic should you have any questions or concerns. The clinic phone number is [phone].  Please show the CHEMO ALERT CARD at check-in to the Emergency Department and triage nurse.   

## 2019-04-13 NOTE — Patient Instructions (Addendum)
[place] at [facility] Discharge Instructions  Continue treatment with labs every week.    Thank you for choosing [place] at [facility] to provide your oncology and hematology care.  To afford each patient quality time with our provider, please arrive at least 15 minutes before your scheduled appointment time.   If you have a lab appointment with the [facility] please come in thru the  Main Entrance and check in at the main information desk  You need to re-schedule your appointment should you arrive 10 or more minutes late.  We strive to give you quality time with our providers, and arriving late affects you and other patients whose appointments are after yours.  Also, if you no show three or more times for appointments you may be dismissed from the clinic at the providers discretion.     Again, thank you for choosing [facility].  Our hope is that these requests will decrease the amount of time that you wait before being seen by our physicians.       _____________________________________________________________  Should you have questions after your visit to [facility], please contact our office at [phone] between the hours of 8:00 a.m. and 4:30 p.m.  Voicemails left after 4:00 p.m. will not be returned until the following business day.  For prescription refill requests, have your pharmacy contact our office and allow 72 hours.    Cancer Center Support Programs:   > Cancer Support Group  2nd Tuesday of the month 1pm-2pm, Journey Room

## 2019-04-14 ENCOUNTER — Telehealth (HOSPITAL_COMMUNITY): Payer: Self-pay | Admitting: Hematology

## 2019-04-14 NOTE — Telephone Encounter (Signed)
Pc to pt reminding her to submit bills etc for the [employer] she applied for. Pt stating she would bring them in on her next tx

## 2019-04-16 NOTE — Assessment & Plan Note (Signed)
1.  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 -4 cycles of dose dense AC from 02/01/2019 through 03/15/2019. - Weekly paclitaxel started on 03/30/2019.  She is tolerating paclitaxel very well.  No neuropathy. -She will continue with her treatment today.  I have reviewed her labs.  We will see her every other week for toxicity assessment.  2.  Normocytic anemia: - This is from myelosuppression from chemotherapy.  Hemoglobin today is 8.1.  No transfusion needed.  3.  Family history: -Sister had metastatic breast cancer at age 68.  We have recommended genetic testing.

## 2019-04-20 ENCOUNTER — Other Ambulatory Visit: Payer: Self-pay

## 2019-04-20 ENCOUNTER — Inpatient Hospital Stay (HOSPITAL_COMMUNITY): Payer: PRIVATE HEALTH INSURANCE

## 2019-04-20 ENCOUNTER — Encounter (HOSPITAL_COMMUNITY): Payer: Self-pay

## 2019-04-20 VITALS — BP 132/87 | HR 78 | Temp 97.7°F | Resp 18 | Wt 182.6 lb

## 2019-04-20 DIAGNOSIS — C50412 Malignant neoplasm of upper-outer quadrant of left female breast: Secondary | ICD-10-CM

## 2019-04-20 DIAGNOSIS — Z5111 Encounter for antineoplastic chemotherapy: Secondary | ICD-10-CM | POA: Diagnosis not present

## 2019-04-20 DIAGNOSIS — Z170 Estrogen receptor positive status [ER+]: Secondary | ICD-10-CM

## 2019-04-20 LAB — CBC WITH DIFFERENTIAL/PLATELET
Abs Immature Granulocytes: 0.18 10*3/uL — ABNORMAL HIGH (ref 0.00–0.07)
Basophils Absolute: 0 10*3/uL (ref 0.0–0.1)
Basophils Relative: 0 %
Eosinophils Absolute: 0.2 10*3/uL (ref 0.0–0.5)
Eosinophils Relative: 2 %
HCT: 27.9 % — ABNORMAL LOW (ref 36.0–46.0)
Hemoglobin: 8.3 g/dL — ABNORMAL LOW (ref 12.0–15.0)
Immature Granulocytes: 2 %
Lymphocytes Relative: 9 %
Lymphs Abs: 0.8 10*3/uL (ref 0.7–4.0)
MCH: 29.1 pg (ref 26.0–34.0)
MCHC: 29.7 g/dL — ABNORMAL LOW (ref 30.0–36.0)
MCV: 97.9 fL (ref 80.0–100.0)
Monocytes Absolute: 1.1 10*3/uL — ABNORMAL HIGH (ref 0.1–1.0)
Monocytes Relative: 12 %
Neutro Abs: 6.6 10*3/uL (ref 1.7–7.7)
Neutrophils Relative %: 75 %
Platelets: 281 10*3/uL (ref 150–400)
RBC: 2.85 MIL/uL — ABNORMAL LOW (ref 3.87–5.11)
RDW: 20 % — ABNORMAL HIGH (ref 11.5–15.5)
WBC: 8.9 10*3/uL (ref 4.0–10.5)
nRBC: 0.8 % — ABNORMAL HIGH (ref 0.0–0.2)

## 2019-04-20 LAB — COMPREHENSIVE METABOLIC PANEL
ALT: 16 U/L (ref 0–44)
AST: 12 U/L — ABNORMAL LOW (ref 15–41)
Albumin: 3.6 g/dL (ref 3.5–5.0)
Alkaline Phosphatase: 57 U/L (ref 38–126)
Anion gap: 8 (ref 5–15)
BUN: 21 mg/dL (ref 8–23)
CO2: 26 mmol/L (ref 22–32)
Calcium: 9.3 mg/dL (ref 8.9–10.3)
Chloride: 107 mmol/L (ref 98–111)
Creatinine, Ser: 0.61 mg/dL (ref 0.44–1.00)
GFR calc Af Amer: >60 mL/min (ref >60)
GFR calc non Af Amer: >60 mL/min (ref >60)
Glucose, Bld: 92 mg/dL (ref 70–99)
Potassium: 3.3 mmol/L — ABNORMAL LOW (ref 3.5–5.1)
Sodium: 141 mmol/L (ref 135–145)
Total Bilirubin: 0.5 mg/dL (ref 0.3–1.2)
Total Protein: 6.5 g/dL (ref 6.5–8.1)

## 2019-04-20 MED ORDER — FAMOTIDINE IN NACL 20-0.9 MG/50ML-% IV SOLN
20.0000 mg | Freq: Once | INTRAVENOUS | Status: AC
  Administered 2019-04-20: 10:00:00 20 mg via INTRAVENOUS
  Filled 2019-04-20: qty 50

## 2019-04-20 MED ORDER — DIPHENHYDRAMINE HCL 50 MG/ML IJ SOLN
25.0000 mg | Freq: Once | INTRAMUSCULAR | Status: AC
  Administered 2019-04-20: 11:00:00 25 mg via INTRAVENOUS
  Filled 2019-04-20: qty 1

## 2019-04-20 MED ORDER — SODIUM CHLORIDE 0.9% FLUSH
10.0000 mL | INTRAVENOUS | Status: DC | PRN
  Administered 2019-04-20: 09:00:00 10 mL
  Filled 2019-04-20: qty 10

## 2019-04-20 MED ORDER — SODIUM CHLORIDE 0.9 % IV SOLN
20.0000 mg | Freq: Once | INTRAVENOUS | Status: AC
  Administered 2019-04-20: 11:00:00 20 mg via INTRAVENOUS
  Filled 2019-04-20: qty 20

## 2019-04-20 MED ORDER — POTASSIUM CHLORIDE CRYS ER 20 MEQ PO TBCR
20.0000 meq | EXTENDED_RELEASE_TABLET | Freq: Once | ORAL | Status: AC
  Administered 2019-04-20: 12:00:00 20 meq via ORAL
  Filled 2019-04-20: qty 1

## 2019-04-20 MED ORDER — SODIUM CHLORIDE 0.9 % IV SOLN
80.0000 mg/m2 | Freq: Once | INTRAVENOUS | Status: AC
  Administered 2019-04-20: 12:00:00 162 mg via INTRAVENOUS
  Filled 2019-04-20: qty 27

## 2019-04-20 MED ORDER — SODIUM CHLORIDE 0.9 % IV SOLN
Freq: Once | INTRAVENOUS | Status: AC
  Administered 2019-04-20: 10:00:00 via INTRAVENOUS

## 2019-04-20 MED ORDER — HEPARIN SOD (PORK) LOCK FLUSH 100 UNIT/ML IV SOLN
500.0000 [IU] | Freq: Once | INTRAVENOUS | Status: AC | PRN
  Administered 2019-04-20: 13:00:00 500 [IU]

## 2019-04-20 NOTE — Patient Instructions (Signed)
[facility] Discharge Instructions for Patients Receiving Chemotherapy   Beginning January 23rd 2017 lab work for the Cancer Center will be done in the  Main lab at [place] on 1st floor. If you have a lab appointment with the Cancer Center please come in thru the  Main Entrance and check in at the main information desk   Today you received the following chemotherapy agents Taxol. Follow-up as scheduled. Call clinic for any questions or concerns  To help prevent nausea and vomiting after your treatment, we encourage you to take your nausea medication   If you develop nausea and vomiting, or diarrhea that is not controlled by your medication, call the clinic.  The clinic phone number is [phone]. Office hours are Monday-Friday 8:30am-5:00pm.  BELOW ARE SYMPTOMS THAT SHOULD BE REPORTED IMMEDIATELY:  *FEVER GREATER THAN 101.0 F  *CHILLS WITH OR WITHOUT FEVER  NAUSEA AND VOMITING THAT IS NOT CONTROLLED WITH YOUR NAUSEA MEDICATION  *UNUSUAL SHORTNESS OF BREATH  *UNUSUAL BRUISING OR BLEEDING  TENDERNESS IN MOUTH AND THROAT WITH OR WITHOUT PRESENCE OF ULCERS  *URINARY PROBLEMS  *BOWEL PROBLEMS  UNUSUAL RASH Items with * indicate a potential emergency and should be followed up as soon as possible. If you have an emergency after office hours please contact your primary care physician or go to the nearest emergency department.  Please call the clinic during office hours if you have any questions or concerns.   You may also contact the Patient Navigator at [phone] should you have any questions or need assistance in obtaining follow up care.      Resources For Cancer Patients and their Caregivers ? American Cancer Society: Can assist with transportation, wigs, general needs, runs Look Good Feel Better.        1-888-227-6333 ? Cancer Care: Provides financial assistance, online support groups, medication/co-pay assistance.  1-800-813-HOPE  (4673) ? [facility] Assists [place] cancer patients and their families through emotional , educational and financial support.  336-427-4357 ? [place] DSS Where to apply for food stamps, Medicaid and utility assistance. 336-342-1394 ? [facility]: Transportation to medical appointments. 336-347-2287 ? Social Security Administration: May apply for disability if have a Stage IV cancer. 336-342-7796 1-800-772-1213 ? [place] Aging, Disability and Transit Services: Assists with nutrition, care and transit needs. 336-349-2343         

## 2019-04-20 NOTE — Progress Notes (Signed)
Labs reviewed prior to administering Taxol infusion

## 2019-04-20 NOTE — Progress Notes (Signed)
Bridget Richardson tolerated Taxol infusion well without complaints or incident. Pt given Potassium 20 meq PO for K+ 3.3 per MD order. VSS upon discharge. Pt discharged self ambulatory in satisfactory condition

## 2019-04-24 ENCOUNTER — Telehealth (HOSPITAL_COMMUNITY): Payer: Self-pay | Admitting: Hematology

## 2019-04-24 NOTE — Telephone Encounter (Signed)
PT APPROVED FOR [employer] GRANT IN THE AMT OF $ 1000.00 EFFT DATE 03/02/19

## 2019-04-27 ENCOUNTER — Inpatient Hospital Stay (HOSPITAL_BASED_OUTPATIENT_CLINIC_OR_DEPARTMENT_OTHER): Payer: PRIVATE HEALTH INSURANCE | Admitting: Hematology

## 2019-04-27 ENCOUNTER — Encounter (HOSPITAL_COMMUNITY): Payer: Self-pay | Admitting: Hematology

## 2019-04-27 ENCOUNTER — Inpatient Hospital Stay (HOSPITAL_COMMUNITY): Payer: PRIVATE HEALTH INSURANCE

## 2019-04-27 ENCOUNTER — Other Ambulatory Visit: Payer: Self-pay

## 2019-04-27 ENCOUNTER — Inpatient Hospital Stay (HOSPITAL_COMMUNITY): Payer: PRIVATE HEALTH INSURANCE | Attending: Hematology

## 2019-04-27 VITALS — BP 102/75 | HR 84 | Temp 97.1°F | Resp 18

## 2019-04-27 DIAGNOSIS — Z803 Family history of malignant neoplasm of breast: Secondary | ICD-10-CM | POA: Insufficient documentation

## 2019-04-27 DIAGNOSIS — I2699 Other pulmonary embolism without acute cor pulmonale: Secondary | ICD-10-CM | POA: Diagnosis not present

## 2019-04-27 DIAGNOSIS — R5383 Other fatigue: Secondary | ICD-10-CM | POA: Insufficient documentation

## 2019-04-27 DIAGNOSIS — C50412 Malignant neoplasm of upper-outer quadrant of left female breast: Secondary | ICD-10-CM | POA: Insufficient documentation

## 2019-04-27 DIAGNOSIS — Z170 Estrogen receptor positive status [ER+]: Secondary | ICD-10-CM

## 2019-04-27 DIAGNOSIS — G479 Sleep disorder, unspecified: Secondary | ICD-10-CM | POA: Diagnosis not present

## 2019-04-27 DIAGNOSIS — Z5111 Encounter for antineoplastic chemotherapy: Secondary | ICD-10-CM | POA: Diagnosis not present

## 2019-04-27 DIAGNOSIS — Z87891 Personal history of nicotine dependence: Secondary | ICD-10-CM | POA: Insufficient documentation

## 2019-04-27 DIAGNOSIS — D649 Anemia, unspecified: Secondary | ICD-10-CM | POA: Insufficient documentation

## 2019-04-27 DIAGNOSIS — Z9981 Dependence on supplemental oxygen: Secondary | ICD-10-CM | POA: Diagnosis not present

## 2019-04-27 DIAGNOSIS — Z79899 Other long term (current) drug therapy: Secondary | ICD-10-CM | POA: Insufficient documentation

## 2019-04-27 DIAGNOSIS — R197 Diarrhea, unspecified: Secondary | ICD-10-CM | POA: Diagnosis not present

## 2019-04-27 DIAGNOSIS — R0602 Shortness of breath: Secondary | ICD-10-CM | POA: Insufficient documentation

## 2019-04-27 LAB — COMPREHENSIVE METABOLIC PANEL
ALT: 23 U/L (ref 0–44)
AST: 20 U/L (ref 15–41)
Albumin: 3.3 g/dL — ABNORMAL LOW (ref 3.5–5.0)
Alkaline Phosphatase: 86 U/L (ref 38–126)
Anion gap: 9 (ref 5–15)
BUN: 13 mg/dL (ref 8–23)
CO2: 24 mmol/L (ref 22–32)
Calcium: 9.2 mg/dL (ref 8.9–10.3)
Chloride: 103 mmol/L (ref 98–111)
Creatinine, Ser: 0.67 mg/dL (ref 0.44–1.00)
GFR calc Af Amer: >60 mL/min (ref >60)
GFR calc non Af Amer: >60 mL/min (ref >60)
Glucose, Bld: 152 mg/dL — ABNORMAL HIGH (ref 70–99)
Potassium: 4 mmol/L (ref 3.5–5.1)
Sodium: 136 mmol/L (ref 135–145)
Total Bilirubin: 0.6 mg/dL (ref 0.3–1.2)
Total Protein: 6.8 g/dL (ref 6.5–8.1)

## 2019-04-27 LAB — CBC WITH DIFFERENTIAL/PLATELET
Abs Immature Granulocytes: 0.06 10*3/uL (ref 0.00–0.07)
Basophils Absolute: 0.1 10*3/uL (ref 0.0–0.1)
Basophils Relative: 1 %
Eosinophils Absolute: 0.5 10*3/uL (ref 0.0–0.5)
Eosinophils Relative: 5 %
HCT: 29.7 % — ABNORMAL LOW (ref 36.0–46.0)
Hemoglobin: 8.8 g/dL — ABNORMAL LOW (ref 12.0–15.0)
Immature Granulocytes: 1 %
Lymphocytes Relative: 5 %
Lymphs Abs: 0.5 10*3/uL — ABNORMAL LOW (ref 0.7–4.0)
MCH: 29.2 pg (ref 26.0–34.0)
MCHC: 29.6 g/dL — ABNORMAL LOW (ref 30.0–36.0)
MCV: 98.7 fL (ref 80.0–100.0)
Monocytes Absolute: 1.1 10*3/uL — ABNORMAL HIGH (ref 0.1–1.0)
Monocytes Relative: 11 %
Neutro Abs: 8.1 10*3/uL — ABNORMAL HIGH (ref 1.7–7.7)
Neutrophils Relative %: 77 %
Platelets: 220 10*3/uL (ref 150–400)
RBC: 3.01 MIL/uL — ABNORMAL LOW (ref 3.87–5.11)
RDW: 19 % — ABNORMAL HIGH (ref 11.5–15.5)
WBC: 10.3 10*3/uL (ref 4.0–10.5)
nRBC: 0 % (ref 0.0–0.2)

## 2019-04-27 MED ORDER — SODIUM CHLORIDE 0.9 % IV SOLN
80.0000 mg/m2 | Freq: Once | INTRAVENOUS | Status: AC
  Administered 2019-04-27: 162 mg via INTRAVENOUS
  Filled 2019-04-27: qty 27

## 2019-04-27 MED ORDER — SODIUM CHLORIDE 0.9 % IV SOLN
Freq: Once | INTRAVENOUS | Status: AC
  Administered 2019-04-27: 09:00:00 via INTRAVENOUS

## 2019-04-27 MED ORDER — SODIUM CHLORIDE 0.9 % IV SOLN
20.0000 mg | Freq: Once | INTRAVENOUS | Status: AC
  Administered 2019-04-27: 09:00:00 20 mg via INTRAVENOUS
  Filled 2019-04-27: qty 20

## 2019-04-27 MED ORDER — SODIUM CHLORIDE 0.9% FLUSH
10.0000 mL | INTRAVENOUS | Status: DC | PRN
  Administered 2019-04-27: 10 mL
  Filled 2019-04-27: qty 10

## 2019-04-27 MED ORDER — TRAZODONE HCL 50 MG PO TABS: 50.0000 mg | ORAL_TABLET | Freq: Every evening | ORAL | 1 refills | Status: DC | PRN

## 2019-04-27 MED ORDER — FAMOTIDINE IN NACL 20-0.9 MG/50ML-% IV SOLN
20.0000 mg | Freq: Once | INTRAVENOUS | Status: AC
  Administered 2019-04-27: 20 mg via INTRAVENOUS
  Filled 2019-04-27: qty 50

## 2019-04-27 MED ORDER — DIPHENHYDRAMINE HCL 50 MG/ML IJ SOLN
25.0000 mg | Freq: Once | INTRAMUSCULAR | Status: AC
  Administered 2019-04-27: 10:00:00 25 mg via INTRAVENOUS
  Filled 2019-04-27: qty 1

## 2019-04-27 MED ORDER — HEPARIN SOD (PORK) LOCK FLUSH 100 UNIT/ML IV SOLN
500.0000 [IU] | Freq: Once | INTRAVENOUS | Status: AC | PRN
  Administered 2019-04-27: 12:00:00 500 [IU]

## 2019-04-27 NOTE — Progress Notes (Signed)
Patient seen by [name] with lab review with verbal order ok to treat today.    Patient tolerated chemotherapy with no complaints voiced.  Port site clean and dry with no bruising or swelling noted at site.  Good blood return noted before and after administration of chemotherapy.  Band aid applied.  Patient left ambulatory with VSS and no s/s of distress noted.

## 2019-04-27 NOTE — Progress Notes (Signed)
Bridget Richardson, Bridget Richardson [id number]   CLINIC:  Medical Oncology/Hematology  PCP:  Bridget Police, [place] [address] [place] [address] [phone]   REASON FOR VISIT: Follow-up for left breast cancer.  CURRENT THERAPY: Weekly paclitaxel.  BRIEF ONCOLOGIC HISTORY:  Oncology History  Malignant neoplasm of upper-outer quadrant of left breast in [place], estrogen receptor positive ([place])  01/04/2019 Initial Diagnosis   Malignant neoplasm of upper-outer quadrant of left breast in [place], estrogen receptor positive ([place])   01/19/2019 Cancer Staging   Staging form: Breast, AJCC 8th Edition - Pathologic stage from 01/19/2019: Stage IIA (pT2, pN1, cM0, G3, ER+, PR+, HER2-) - Signed by Bridget Jack, Richardson on 01/19/2019   02/01/2019 -  Chemotherapy   The patient had DOXOrubicin (ADRIAMYCIN) chemo injection 120 mg, 60 mg/m2 = 120 mg, Intravenous,  Once, 4 of 4 cycles Administration: 120 mg (02/01/2019), 120 mg (02/15/2019), 120 mg (03/02/2019), 120 mg (03/15/2019) palonosetron (ALOXI) injection 0.25 mg, 0.25 mg, Intravenous,  Once, 4 of 4 cycles Administration: 0.25 mg (02/01/2019), 0.25 mg (02/15/2019), 0.25 mg (03/02/2019), 0.25 mg (03/15/2019) pegfilgrastim (NEULASTA ONPRO KIT) injection 6 mg, 6 mg, Subcutaneous, Once, 4 of 4 cycles Administration: 6 mg (02/01/2019), 6 mg (02/15/2019), 6 mg (03/02/2019), 6 mg (03/15/2019) cyclophosphamide (CYTOXAN) 1,200 mg in sodium chloride 0.9 % 250 mL chemo infusion, 600 mg/m2 = 1,200 mg, Intravenous,  Once, 4 of 4 cycles Administration: 1,200 mg (02/01/2019), 1,200 mg (02/15/2019), 1,200 mg (03/02/2019), 1,200 mg (03/15/2019) PACLitaxel (TAXOL) 162 mg in sodium chloride 0.9 % 250 mL chemo infusion (</= 38m/m2), 80 mg/m2 = 162 mg, Intravenous,  Once, 5 of 12 cycles Administration: 162 mg (03/30/2019), 162 mg (04/06/2019), 162 mg (04/13/2019), 162 mg (04/20/2019), 162 mg (04/27/2019) fosaprepitant (EMEND) 150 mg, dexamethasone  (DECADRON) 12 mg in sodium chloride 0.9 % 145 mL IVPB, , Intravenous,  Once, 4 of 4 cycles Administration:  (02/01/2019),  (02/15/2019),  (03/02/2019),  (03/15/2019)  for chemotherapy treatment.       CANCER STAGING: Cancer Staging Malignant neoplasm of upper-outer quadrant of left breast in [place], [place] receptor positive [place] Staging form: Breast, AJCC 8th Edition - Clinical: No stage assigned - Unsigned - Pathologic stage from 01/19/2019: Stage IIA (pT2, pN1, cM0, G3, ER+, PR+, HER2-) - Signed by Bridget Richardson on 01/19/2019    INTERVAL HISTORY:  Bridget Richardson seen for follow-up of left breast cancer.  She is receiving weekly paclitaxel.  Week 4 was on 04/20/2019.  Denies any tingling or numbness next 20s.  Reports sleep problems.  She is waking up around midnight and cannot go back to sleep.  Mild fatigue is stable.  She is continuing to work part-time.  Denies any pain in the extremities.  Denies any fevers or chills.    REVIEW OF SYSTEMS:  Review of Systems  Constitutional: Positive for fatigue.  Psychiatric/Behavioral: Positive for sleep disturbance.  All other systems reviewed and are negative.    PAST MEDICAL/SURGICAL HISTORY:  Past Medical History:  Diagnosis Date  . Coronary artery disease   . Diabetes mellitus without complication ([place]   . Hypertension    Past Surgical History:  Procedure Laterality Date  . ANKLE SURGERY Right 2009  . MASTECTOMY MODIFIED RADICAL Left 01/04/2019   Procedure: MASTECTOMY MODIFIED RADICAL;  Surgeon: Bridget Richardson;  Location: [facility];  Service: General;  Laterality: Left;  . PORTACATH PLACEMENT Right 01/30/2019   Procedure: INSERTION PORT-A-CATH;  Surgeon: Bridget Richardson;  Location: [facility];  Service: General;  Laterality: Right;  . WRIST SURGERY Right 1997     SOCIAL HISTORY:  Social History   Socioeconomic History  . Marital status: Divorced    Spouse name: Not on file  . Number of children: Not on  file  . Years of education: Not on file  . Highest education level: Not on file  Occupational History  . Not on file  Social Needs  . Financial resource strain: Not on file  . Food insecurity    Worry: Not on file    Inability: Not on file  . Transportation needs    Medical: Not on file    Non-medical: Not on file  Tobacco Use  . Smoking status: Former Smoker    Packs/day: 0.25    Years: 2.00    Pack years: 0.50    Types: Cigarettes    Quit date: 1983    Years since quitting: 37.6  . Smokeless tobacco: Never Used  Substance and Sexual Activity  . Alcohol use: Never    Frequency: Never  . Drug use: Never  . Sexual activity: Not Currently  Lifestyle  . Physical activity    Days per week: Not on file    Minutes per session: Not on file  . Stress: Not on file  Relationships  . Social Herbalist on phone: Not on file    Gets together: Not on file    Attends religious service: Not on file    Active member of club or organization: Not on file    Attends meetings of clubs or organizations: Not on file    Relationship status: Not on file  . Intimate partner violence    Fear of current or ex partner: Not on file    Emotionally abused: Not on file    Physically abused: Not on file    Forced sexual activity: Not on file  Other Topics Concern  . Not on file  Social History Narrative  . Not on file    FAMILY HISTORY:  No family history on file.  CURRENT MEDICATIONS:  Outpatient Encounter Medications as of 04/27/2019  Medication Sig  . atorvastatin (LIPITOR) 40 MG tablet Take 40 mg by mouth at bedtime.   . carvedilol (COREG) 6.25 MG tablet Take 6.25 mg by mouth every morning.   . CYCLOPHOSPHAMIDE IV Inject into the vein every 14 (fourteen) days.  Marland Kitchen dexamethasone (DECADRON) 4 MG tablet Take 8 mg by mouth 2 (two) times daily with a meal.  . DOXORUBICIN HCL IV Inject into the vein every 14 (fourteen) days.  . enalapril (VASOTEC) 20 MG tablet Take 20 mg by mouth 2  (two) times daily.  . ferrous sulfate 325 (65 FE) MG tablet Take 325 mg by mouth daily with breakfast.  . lidocaine-prilocaine (EMLA) cream Apply small amount to port a cath site and cover with plastic wrap 1 hour prior to chemotherapy appointments  . metFORMIN (GLUCOPHAGE) 850 MG tablet Take 850 mg by mouth daily with breakfast.   . ondansetron (ZOFRAN) 4 MG tablet Take 4 mg by mouth every 8 (eight) hours as needed for nausea or vomiting.  Marland Kitchen PACLITAXEL IV Inject into the vein once a week.  . prochlorperazine (COMPAZINE) 10 MG tablet Take 1 tablet (10 mg total) by mouth every 6 (six) hours as needed (Nausea or vomiting).  Marland Kitchen tiZANidine (ZANAFLEX) 4 MG tablet TAKE 1 TABLET BY MOUTH THREE TIMES A DAY AS NEEDED FOR FLANK PAIN  .  traZODone (DESYREL) 50 MG tablet Take 1 tablet (50 mg total) by mouth at bedtime as needed for sleep.   No facility-administered encounter medications on file as of 04/27/2019.     ALLERGIES:  No Known Allergies   PHYSICAL EXAM:  ECOG Performance status: 0  Vitals:   04/27/19 0751  BP: (!) 138/48  Pulse: 96  Resp: 18  Temp: (!) 97.1 F (36.2 C)  SpO2: 94%   Filed Weights   04/27/19 0751  Weight: 174 lb 9.6 oz (79.2 kg)    Physical Exam Vitals Richardson reviewed.  Constitutional:      Appearance: Normal appearance.  Cardiovascular:     Rate and Rhythm: Normal rate and regular rhythm.     Heart sounds: Normal heart sounds.  Pulmonary:     Effort: Pulmonary effort is normal.     Breath sounds: Normal breath sounds.  Abdominal:     General: There is no distension.     Palpations: Abdomen is soft. There is no mass.  Musculoskeletal:        General: No swelling.  Lymphadenopathy:     Cervical: No cervical adenopathy.  Skin:    General: Skin is warm.  Neurological:     General: No focal deficit present.     Mental Status: She is alert and oriented to person, place, and time.  Psychiatric:        Mood and Affect: Mood normal.        Behavior: Behavior  normal.      LABORATORY DATA:  I have reviewed the labs as listed.  CBC    Component Value Date/Time   WBC 10.3 04/27/2019 0752   RBC 3.01 (L) 04/27/2019 0752   HGB 8.8 (L) 04/27/2019 0752   HCT 29.7 (L) 04/27/2019 0752   PLT 220 04/27/2019 0752   MCV 98.7 04/27/2019 0752   MCH 29.2 04/27/2019 0752   MCHC 29.6 (L) 04/27/2019 0752   RDW 19.0 (H) 04/27/2019 0752   LYMPHSABS 0.5 (L) 04/27/2019 0752   MONOABS 1.1 (H) 04/27/2019 0752   EOSABS 0.5 04/27/2019 0752   BASOSABS 0.1 04/27/2019 0752   CMP Latest Ref Rng & Units 04/27/2019 04/20/2019 04/13/2019  Glucose 70 - 99 mg/dL 152(H) 92 112(H)  BUN 8 - 23 mg/dL _0 Creatinine 0.44 - 1.00 mg/dL 0.67 0.61 0.57  Sodium 135 - 145 mmol/L 136 141 139  Potassium 3.5 - 5.1 mmol/L 4.0 3.3(L) 3.6  Chloride 98 - 111 mmol/L 103 107 105  CO2 22 - 32 mmol/L _1 Calcium 8.9 - 10.3 mg/dL 9.2 9.3 9.1  Total Protein 6.5 - 8.1 g/dL 6.8 6.5 6.5  Total Bilirubin 0.3 - 1.2 mg/dL 0.6 0.5 0.3  Alkaline Phos 38 - 126 U/L 86 57 71  AST 15 - 41 U/L 20 12(L) 15  ALT 0 - 44 U/L _2 DIAGNOSTIC IMAGING:  I have independently reviewed the scans and discussed with the patient.     ASSESSMENT & PLAN:   Malignant neoplasm of upper-outer quadrant of left breast in [place], estrogen receptor positive ([place]) 1.  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 -4 cycles of dose dense AC from 02/01/2019 through 03/15/2019. -Weekly paclitaxel started on 03/30/2019,  week 4 on 04/20/2019. - She is tolerating it very well.  No neuropathy reported. - She will proceed with her treatment today.  We will see her back every other week for toxicity assessment.  2.  Normocytic anemia: - This is from myelosuppression from chemotherapy.  Hemoglobin  improved to 8.8.  No transfusion needed.  3.  Family history: -Sister had metastatic breast cancer at age 53. -I have strongly recommended genetic testing.  4.  Sleep problems: - She reports difficulty sleeping.  We will give her trazodone 50 mg at bedtime.   Total time spent is 25 minutes with more than 50% of the time spent face-to-face discussing treatment plan, counseling and coordination of care.  Orders placed this encounter:  No orders of the defined types were placed in this encounter.     Bridget Jack, [place] [phone]

## 2019-04-27 NOTE — Patient Instructions (Addendum)
[place] at [facility] Discharge Instructions  You were seen today by [name]. He went over your recent lab results. He will send a sleeping pill to your pharmacy. He will see you back in 2 weeks for labs and follow up.   Thank you for choosing [place] at [facility] to provide your oncology and hematology care.  To afford each patient quality time with our provider, please arrive at least 15 minutes before your scheduled appointment time.   If you have a lab appointment with the [facility] please come in thru the  Main Entrance and check in at the main information desk  You need to re-schedule your appointment should you arrive 10 or more minutes late.  We strive to give you quality time with our providers, and arriving late affects you and other patients whose appointments are after yours.  Also, if you no show three or more times for appointments you may be dismissed from the clinic at the providers discretion.     Again, thank you for choosing [facility].  Our hope is that these requests will decrease the amount of time that you wait before being seen by our physicians.       _____________________________________________________________  Should you have questions after your visit to [facility], please contact our office at [phone] between the hours of 8:00 a.m. and 4:30 p.m.  Voicemails left after 4:00 p.m. will not be returned until the following business day.  For prescription refill requests, have your pharmacy contact our office and allow 72 hours.    Cancer Center Support Programs:   > Cancer Support Group  2nd Tuesday of the month 1pm-2pm, Journey Room

## 2019-04-27 NOTE — Assessment & Plan Note (Addendum)
1.  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 -4 cycles of dose dense AC from 02/01/2019 through 03/15/2019. -Weekly paclitaxel started on 03/30/2019, week 4 on 04/20/2019. - She is tolerating it very well.  No neuropathy reported. - She will proceed with her treatment today.  We will see her back every other week for toxicity assessment.  2.  Normocytic anemia: - This is from myelosuppression from chemotherapy.  Hemoglobin improved to 8.8.  No transfusion needed.  3.  Family history: -Sister had metastatic breast cancer at age 35. -I have strongly recommended genetic testing.  4.  Sleep problems: - She reports difficulty sleeping.  We will give her trazodone 50 mg at bedtime.

## 2019-05-02 ENCOUNTER — Encounter (HOSPITAL_COMMUNITY): Payer: Self-pay | Admitting: *Deleted

## 2019-05-02 ENCOUNTER — Other Ambulatory Visit (HOSPITAL_COMMUNITY): Payer: Self-pay | Admitting: *Deleted

## 2019-05-02 MED ORDER — TRAZODONE HCL 100 MG PO TABS: 100.0000 mg | ORAL_TABLET | Freq: Every evening | ORAL | 2 refills | Status: DC | PRN

## 2019-05-02 NOTE — Telephone Encounter (Signed)
This patient called today. She states that she has been having increased trouble staying asleep at night. She is not naping during the day and she still can not stay asleep. she said then once she wakes up it is hard for her to go back to sleep so she is usually just up for the night. per [name], okay for patient to increase her trazodone to 100 mg QHS.  I have sent in a new prescription for the patient and also sent her a message on mychart to increase her dose.  She was advised to call the clinic if she has any further questions or concerns.

## 2019-05-03 ENCOUNTER — Other Ambulatory Visit (HOSPITAL_COMMUNITY): Payer: Self-pay

## 2019-05-03 DIAGNOSIS — C50412 Malignant neoplasm of upper-outer quadrant of left female breast: Secondary | ICD-10-CM

## 2019-05-04 ENCOUNTER — Encounter (HOSPITAL_COMMUNITY): Payer: Self-pay

## 2019-05-04 ENCOUNTER — Ambulatory Visit (HOSPITAL_COMMUNITY): Payer: PRIVATE HEALTH INSURANCE | Admitting: Hematology

## 2019-05-04 ENCOUNTER — Inpatient Hospital Stay (HOSPITAL_COMMUNITY): Payer: PRIVATE HEALTH INSURANCE

## 2019-05-04 ENCOUNTER — Emergency Department (HOSPITAL_COMMUNITY): Payer: PRIVATE HEALTH INSURANCE

## 2019-05-04 ENCOUNTER — Other Ambulatory Visit (HOSPITAL_COMMUNITY): Payer: PRIVATE HEALTH INSURANCE

## 2019-05-04 ENCOUNTER — Inpatient Hospital Stay (HOSPITAL_COMMUNITY)
Admission: EM | Admit: 2019-05-04 | Discharge: 2019-05-09 | DRG: 189 | Disposition: A | Discharge status: Home / Self Care | Payer: PRIVATE HEALTH INSURANCE | Attending: Family Medicine | Admitting: Family Medicine

## 2019-05-04 ENCOUNTER — Other Ambulatory Visit: Payer: Self-pay

## 2019-05-04 DIAGNOSIS — I2601 Septic pulmonary embolism with acute cor pulmonale: Secondary | ICD-10-CM | POA: Diagnosis not present

## 2019-05-04 DIAGNOSIS — J9601 Acute respiratory failure with hypoxia: Principal | ICD-10-CM | POA: Diagnosis present

## 2019-05-04 DIAGNOSIS — I10 Essential (primary) hypertension: Secondary | ICD-10-CM | POA: Diagnosis present

## 2019-05-04 DIAGNOSIS — C50412 Malignant neoplasm of upper-outer quadrant of left female breast: Secondary | ICD-10-CM

## 2019-05-04 DIAGNOSIS — Z170 Estrogen receptor positive status [ER+]: Secondary | ICD-10-CM

## 2019-05-04 DIAGNOSIS — I2699 Other pulmonary embolism without acute cor pulmonale: Secondary | ICD-10-CM | POA: Diagnosis present

## 2019-05-04 DIAGNOSIS — R0600 Dyspnea, unspecified: Secondary | ICD-10-CM

## 2019-05-04 DIAGNOSIS — M19011 Primary osteoarthritis, right shoulder: Secondary | ICD-10-CM | POA: Diagnosis present

## 2019-05-04 DIAGNOSIS — E785 Hyperlipidemia, unspecified: Secondary | ICD-10-CM | POA: Diagnosis not present

## 2019-05-04 DIAGNOSIS — E119 Type 2 diabetes mellitus without complications: Secondary | ICD-10-CM

## 2019-05-04 DIAGNOSIS — Z79899 Other long term (current) drug therapy: Secondary | ICD-10-CM

## 2019-05-04 DIAGNOSIS — J40 Bronchitis, not specified as acute or chronic: Secondary | ICD-10-CM | POA: Diagnosis present

## 2019-05-04 DIAGNOSIS — D6481 Anemia due to antineoplastic chemotherapy: Secondary | ICD-10-CM | POA: Diagnosis present

## 2019-05-04 DIAGNOSIS — M19012 Primary osteoarthritis, left shoulder: Secondary | ICD-10-CM | POA: Diagnosis present

## 2019-05-04 DIAGNOSIS — Z87891 Personal history of nicotine dependence: Secondary | ICD-10-CM

## 2019-05-04 DIAGNOSIS — I2510 Atherosclerotic heart disease of native coronary artery without angina pectoris: Secondary | ICD-10-CM | POA: Diagnosis present

## 2019-05-04 DIAGNOSIS — R42 Dizziness and giddiness: Secondary | ICD-10-CM | POA: Diagnosis present

## 2019-05-04 DIAGNOSIS — D649 Anemia, unspecified: Secondary | ICD-10-CM | POA: Diagnosis present

## 2019-05-04 DIAGNOSIS — Z20828 Contact with and (suspected) exposure to other viral communicable diseases: Secondary | ICD-10-CM | POA: Diagnosis present

## 2019-05-04 DIAGNOSIS — T451X5A Adverse effect of antineoplastic and immunosuppressive drugs, initial encounter: Secondary | ICD-10-CM | POA: Diagnosis present

## 2019-05-04 DIAGNOSIS — Z9012 Acquired absence of left breast and nipple: Secondary | ICD-10-CM

## 2019-05-04 DIAGNOSIS — I472 Ventricular tachycardia: Secondary | ICD-10-CM | POA: Diagnosis present

## 2019-05-04 DIAGNOSIS — C50919 Malignant neoplasm of unspecified site of unspecified female breast: Secondary | ICD-10-CM | POA: Diagnosis present

## 2019-05-04 HISTORY — DX: Malignant (primary) neoplasm, unspecified: C80.1

## 2019-05-04 LAB — CBC WITH DIFFERENTIAL/PLATELET
Abs Immature Granulocytes: 0.05 10*3/uL (ref 0.00–0.07)
Abs Immature Granulocytes: 0.04 10*3/uL (ref 0.00–0.07)
Basophils Absolute: 0 10*3/uL (ref 0.0–0.1)
Basophils Absolute: 0 10*3/uL (ref 0.0–0.1)
Basophils Relative: 0 %
Basophils Relative: 1 %
Eosinophils Absolute: 0.3 10*3/uL (ref 0.0–0.5)
Eosinophils Absolute: 0.2 10*3/uL (ref 0.0–0.5)
Eosinophils Relative: 3 %
Eosinophils Relative: 3 %
HCT: 29.3 % — ABNORMAL LOW (ref 36.0–46.0)
HCT: 27.3 % — ABNORMAL LOW (ref 36.0–46.0)
Hemoglobin: 8.8 g/dL — ABNORMAL LOW (ref 12.0–15.0)
Hemoglobin: 8.2 g/dL — ABNORMAL LOW (ref 12.0–15.0)
Immature Granulocytes: 1 %
Immature Granulocytes: 1 %
Lymphocytes Relative: 5 %
Lymphocytes Relative: 8 %
Lymphs Abs: 0.4 10*3/uL — ABNORMAL LOW (ref 0.7–4.0)
Lymphs Abs: 0.7 10*3/uL (ref 0.7–4.0)
MCH: 28.9 pg (ref 26.0–34.0)
MCH: 29.3 pg (ref 26.0–34.0)
MCHC: 30 g/dL (ref 30.0–36.0)
MCHC: 30 g/dL (ref 30.0–36.0)
MCV: 96.4 fL (ref 80.0–100.0)
MCV: 97.5 fL (ref 80.0–100.0)
Monocytes Absolute: 0.8 10*3/uL (ref 0.1–1.0)
Monocytes Absolute: 0.7 10*3/uL (ref 0.1–1.0)
Monocytes Relative: 9 %
Monocytes Relative: 8 %
Neutro Abs: 7.3 10*3/uL (ref 1.7–7.7)
Neutro Abs: 7.1 10*3/uL (ref 1.7–7.7)
Neutrophils Relative %: 82 %
Neutrophils Relative %: 79 %
Platelets: 239 10*3/uL (ref 150–400)
Platelets: 236 10*3/uL (ref 150–400)
RBC: 3.04 MIL/uL — ABNORMAL LOW (ref 3.87–5.11)
RBC: 2.8 MIL/uL — ABNORMAL LOW (ref 3.87–5.11)
RDW: 17.9 % — ABNORMAL HIGH (ref 11.5–15.5)
RDW: 17.8 % — ABNORMAL HIGH (ref 11.5–15.5)
WBC: 8.9 10*3/uL (ref 4.0–10.5)
WBC: 8.9 10*3/uL (ref 4.0–10.5)
nRBC: 0 % (ref 0.0–0.2)
nRBC: 0 % (ref 0.0–0.2)

## 2019-05-04 LAB — COMPREHENSIVE METABOLIC PANEL
ALT: 15 U/L (ref 0–44)
ALT: 15 U/L (ref 0–44)
AST: 18 U/L (ref 15–41)
AST: 19 U/L (ref 15–41)
Albumin: 3.2 g/dL — ABNORMAL LOW (ref 3.5–5.0)
Albumin: 3 g/dL — ABNORMAL LOW (ref 3.5–5.0)
Alkaline Phosphatase: 91 U/L (ref 38–126)
Alkaline Phosphatase: 89 U/L (ref 38–126)
Anion gap: 10 (ref 5–15)
Anion gap: 12 (ref 5–15)
BUN: 16 mg/dL (ref 8–23)
BUN: 17 mg/dL (ref 8–23)
CO2: 25 mmol/L (ref 22–32)
CO2: 24 mmol/L (ref 22–32)
Calcium: 9 mg/dL (ref 8.9–10.3)
Calcium: 8.8 mg/dL — ABNORMAL LOW (ref 8.9–10.3)
Chloride: 97 mmol/L — ABNORMAL LOW (ref 98–111)
Chloride: 98 mmol/L (ref 98–111)
Creatinine, Ser: 0.69 mg/dL (ref 0.44–1.00)
Creatinine, Ser: 0.69 mg/dL (ref 0.44–1.00)
GFR calc Af Amer: >60 mL/min (ref >60)
GFR calc Af Amer: >60 mL/min (ref >60)
GFR calc non Af Amer: >60 mL/min (ref >60)
GFR calc non Af Amer: >60 mL/min (ref >60)
Glucose, Bld: 133 mg/dL — ABNORMAL HIGH (ref 70–99)
Glucose, Bld: 111 mg/dL — ABNORMAL HIGH (ref 70–99)
Potassium: 3.7 mmol/L (ref 3.5–5.1)
Potassium: 4.1 mmol/L (ref 3.5–5.1)
Sodium: 132 mmol/L — ABNORMAL LOW (ref 135–145)
Sodium: 134 mmol/L — ABNORMAL LOW (ref 135–145)
Total Bilirubin: 0.5 mg/dL (ref 0.3–1.2)
Total Bilirubin: 0.6 mg/dL (ref 0.3–1.2)
Total Protein: 7 g/dL (ref 6.5–8.1)
Total Protein: 6.5 g/dL (ref 6.5–8.1)

## 2019-05-04 LAB — TROPONIN I (HIGH SENSITIVITY)
Troponin I (High Sensitivity): 155 ng/L (ref <18)
Troponin I (High Sensitivity): 122 ng/L (ref <18)

## 2019-05-04 LAB — LACTIC ACID, PLASMA
Lactic Acid, Venous: 1.7 mmol/L (ref 0.5–1.9)
Lactic Acid, Venous: 1.5 mmol/L (ref 0.5–1.9)

## 2019-05-04 LAB — URINALYSIS, ROUTINE W REFLEX MICROSCOPIC
Bilirubin Urine: NEGATIVE
Glucose, UA: NEGATIVE mg/dL
Hgb urine dipstick: NEGATIVE
Ketones, ur: NEGATIVE mg/dL
Nitrite: NEGATIVE
Protein, ur: NEGATIVE mg/dL
Specific Gravity, Urine: 1.04 — ABNORMAL HIGH (ref 1.005–1.030)
pH: 5 (ref 5.0–8.0)

## 2019-05-04 LAB — SARS CORONAVIRUS 2 BY RT PCR (HOSPITAL ORDER, PERFORMED IN ~~LOC~~ HOSPITAL LAB): SARS Coronavirus 2: NEGATIVE

## 2019-05-04 LAB — CBG MONITORING, ED: Glucose-Capillary: 158 mg/dL — ABNORMAL HIGH (ref 70–99)

## 2019-05-04 MED ORDER — TRAZODONE HCL 50 MG PO TABS: 50.0000 mg | ORAL_TABLET | Freq: Every evening | ORAL | Status: DC | PRN

## 2019-05-04 MED ORDER — ATORVASTATIN CALCIUM 40 MG PO TABS
40.0000 mg | ORAL_TABLET | Freq: Every day | ORAL | Status: DC
  Administered 2019-05-04 – 2019-05-08 (×5): 40 mg via ORAL
  Filled 2019-05-04 (×5): qty 1

## 2019-05-04 MED ORDER — HEPARIN BOLUS VIA INFUSION
4000.0000 [IU] | Freq: Once | INTRAVENOUS | Status: AC
  Administered 2019-05-04: 4000 [IU] via INTRAVENOUS

## 2019-05-04 MED ORDER — FERROUS SULFATE 325 (65 FE) MG PO TABS
325.0000 mg | ORAL_TABLET | Freq: Every day | ORAL | Status: DC
  Administered 2019-05-05 – 2019-05-09 (×5): 325 mg via ORAL
  Filled 2019-05-04 (×5): qty 1

## 2019-05-04 MED ORDER — SODIUM CHLORIDE 0.9% FLUSH
10.0000 mL | INTRAVENOUS | Status: DC | PRN
  Administered 2019-05-04: 10 mL via INTRAVENOUS
  Filled 2019-05-04: qty 10

## 2019-05-04 MED ORDER — ACETAMINOPHEN 500 MG PO TABS
1000.0000 mg | ORAL_TABLET | Freq: Once | ORAL | Status: AC
  Administered 2019-05-04: 12:00:00 1000 mg via ORAL
  Filled 2019-05-04: qty 2

## 2019-05-04 MED ORDER — ACETAMINOPHEN 325 MG PO TABS
650.0000 mg | ORAL_TABLET | Freq: Four times a day (QID) | ORAL | Status: DC | PRN
  Administered 2019-05-05 – 2019-05-06 (×6): 650 mg via ORAL
  Filled 2019-05-04 (×6): qty 2

## 2019-05-04 MED ORDER — ONDANSETRON HCL 4 MG PO TABS: 4.0000 mg | ORAL_TABLET | Freq: Three times a day (TID) | ORAL | Status: DC | PRN

## 2019-05-04 MED ORDER — IOHEXOL 350 MG/ML SOLN
100.0000 mL | Freq: Once | INTRAVENOUS | Status: AC | PRN
  Administered 2019-05-04: 100 mL via INTRAVENOUS

## 2019-05-04 MED ORDER — SODIUM CHLORIDE 0.9 % IV BOLUS
1000.0000 mL | Freq: Once | INTRAVENOUS | Status: AC
  Administered 2019-05-04: 1000 mL via INTRAVENOUS

## 2019-05-04 MED ORDER — SODIUM CHLORIDE 0.9 % IV BOLUS
500.0000 mL | Freq: Once | INTRAVENOUS | Status: AC
  Administered 2019-05-04: 12:00:00 500 mL via INTRAVENOUS

## 2019-05-04 MED ORDER — ACETAMINOPHEN 650 MG RE SUPP: 650.0000 mg | Freq: Four times a day (QID) | RECTAL | Status: DC | PRN

## 2019-05-04 MED ORDER — HEPARIN (PORCINE) 25000 UT/250ML-% IV SOLN
1100.0000 [IU]/h | INTRAVENOUS | Status: DC
  Administered 2019-05-04 – 2019-05-07 (×4): 1100 [IU]/h via INTRAVENOUS
  Filled 2019-05-04 (×3): qty 250

## 2019-05-04 MED ORDER — HEPARIN (PORCINE) 25000 UT/250ML-% IV SOLN
INTRAVENOUS | Status: AC
  Filled 2019-05-04: qty 250

## 2019-05-04 MED ORDER — INSULIN ASPART 100 UNIT/ML ~~LOC~~ SOLN
0.0000 [IU] | Freq: Three times a day (TID) | SUBCUTANEOUS | Status: DC
  Administered 2019-05-04: 2 [IU] via SUBCUTANEOUS
  Filled 2019-05-04: qty 1

## 2019-05-04 NOTE — ED Triage Notes (Signed)
PT brought down by cancer center due to complaints of SOb since Tuesday and temp of 100.2. 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

## 2019-05-04 NOTE — H&P (Signed)
History and Physical    Bridget Richardson [id number] DOB: [birth year] DOA: 05/04/2019  I have briefly reviewed the patient's prior medical records in [place]  PCP: [name], MD  Patient coming from: home  Chief Complaint: shortness of breath   HPI: Bridget Richardson is a 63 y.o. female with medical history significant of breast cancer, DM2, HTN, HLD, CAD who presents to the hospital with chief complaint of shortness of breath for the past 2 days.  Patient tells me that she was in her normal state of health up until couple of days ago when she noticed that with ambulation, she is getting out of breath.  This has progressed to the point that today she decided to come to the emergency room.  Prior to coming to the ED she was seen in the cancer center for her scheduled chemotherapy infusion however they noted her to be hypoxic and sent her to the ED.  She did not get the treatment.  She denies any chest pain, denies any palpitations.  She denies any abdominal pain, no nausea or vomiting.  She denies any lightheadedness or dizziness.  This is never happened to her before.  She denies any history of bleeding, no blood in the stool or the urine  ED Course: In the ED she has a low-grade temp of 100, heart rate is in the 90s and on my evaluation she is normotensive with blood pressure into the 110-120s.  She is satting 98% on 2 L. Blood work  Reveals hemoglobin of 8.2, CBC normal.  High-sensitivity troponin was 155 >>122.  Lactic acid is normal at 1.5.  CT angiogram done in the ED showed acute PE with CT evidence of right heart strain.  She was given heparin and we are asked to admit.  Review of Systems: As per HPI otherwise 10 point review of systems negative.   Past Medical History:  Diagnosis Date   Cancer [facility])    stage 2 total mastctomy left breast dx march 2020   Coronary artery disease    Diabetes mellitus without complication ([place])    Hypertension     Past Surgical  History:  Procedure Laterality Date   ANKLE SURGERY Right 2009   MASTECTOMY MODIFIED RADICAL Left 01/04/2019   Procedure: MASTECTOMY MODIFIED RADICAL;  Surgeon: Aviva Signs, MD;  Location: [facility];  Service: General;  Laterality: Left;   PORTACATH PLACEMENT Right 01/30/2019   Procedure: INSERTION PORT-A-CATH;  Surgeon: Aviva Signs, MD;  Location: [facility];  Service: General;  Laterality: Right;   WRIST SURGERY Right 1997     reports that she quit smoking about 37 years ago. Her smoking use included cigarettes. She has a 0.50 pack-year smoking history. She has never used smokeless tobacco. She reports that she does not drink alcohol or use drugs.  No Known Allergies  Family history reviewed and non contributory   Prior to Admission medications   Medication Sig Start Date End Date Taking? Authorizing Provider  atorvastatin (LIPITOR) 40 MG tablet Take 40 mg by mouth at bedtime.    Yes [provider]  carvedilol (COREG) 6.25 MG tablet Take 6.25 mg by mouth See admin instructions. Take 6.25 mg every Mon, Tues, Wed, Fri, Sat, Sun and 12.5 mg every Thursday 10/06/18  Yes [provider]  enalapril (VASOTEC) 20 MG tablet Take 20 mg by mouth 2 (two) times daily.   Yes [provider]  ferrous sulfate 325 (65 FE) MG tablet Take 325 mg by mouth daily  with breakfast.   Yes [provider]  lidocaine-prilocaine (EMLA) cream Apply small amount to port a cath site and cover with plastic wrap 1 hour prior to chemotherapy appointments 01/26/19  Yes Derek Jack, MD  metFORMIN (GLUCOPHAGE) 850 MG tablet Take 850 mg by mouth daily with breakfast.    Yes [provider]  PACLITAXEL IV Inject into the vein once a week. 03/29/19  Yes [provider]  traZODone (DESYREL) 100 MG tablet Take 1 tablet (100 mg total) by mouth at bedtime as needed for sleep. Patient taking differently: Take 50-100 mg by mouth at bedtime as needed for sleep.  05/02/19  Yes  Derek Jack, MD  CYCLOPHOSPHAMIDE IV Inject into the vein every 14 (fourteen) days. 02/01/19   [provider]  dexamethasone (DECADRON) 4 MG tablet Take 8 mg by mouth 2 (two) times daily with a meal.    [provider]  ondansetron (ZOFRAN) 4 MG tablet Take 4 mg by mouth every 8 (eight) hours as needed for nausea or vomiting.    [provider]  prochlorperazine (COMPAZINE) 10 MG tablet Take 1 tablet (10 mg total) by mouth every 6 (six) hours as needed (Nausea or vomiting). Patient not taking: Reported on 05/04/2019 02/01/19   Derek Jack, MD  tiZANidine (ZANAFLEX) 4 MG tablet TAKE 1 TABLET BY MOUTH THREE TIMES A DAY AS NEEDED FOR FLANK PAIN 03/02/19   [provider]    Physical Exam: Vitals:   05/04/19 1400 05/04/19 1511 05/04/19 1530 05/04/19 1630  BP: (!) 97/54 (!) 89/56 (!) 90/46 (!) 113/54  Pulse: 86  90 91  Resp: 19 18    Temp:  99.2 F (37.3 C)    TempSrc:  Oral    SpO2: 95% 99% 98% 97%  Weight:      Height:        Constitutional: NAD, calm, comfortable Eyes: PERRL, lids and conjunctivae normal ENMT: Mucous membranes are moist. Posterior pharynx clear of any exudate or lesions.Normal dentition.  Neck: normal, supple, no masses, no thyromegaly Respiratory: clear to auscultation bilaterally, no wheezing, no crackles. Normal respiratory effort. No accessory muscle use.  Cardiovascular: Regular rate and rhythm, no murmurs / rubs / gallops. No extremity edema. 2+ pedal pulses.  Abdomen: no tenderness, no masses palpated. Bowel sounds positive.  Musculoskeletal: no clubbing / cyanosis. Normal muscle tone.  Skin: no rashes, lesions, ulcers. No induration Neurologic: CN 2-12 grossly intact. Strength 5/5 in all 4.  Psychiatric: Normal judgment and insight. Alert and oriented x 3. Normal mood.   Labs on Admission: I have personally reviewed following labs and imaging studies  CBC: Recent Labs  Lab 05/04/19 0927 05/04/19 1220    WBC 8.9 8.9  NEUTROABS 7.3 7.1  HGB 8.8* 8.2*  HCT 29.3* 27.3*  MCV 96.4 97.5  PLT 239 [id number]   Basic Metabolic Panel: Recent Labs  Lab 05/04/19 0927 05/04/19 1220  NA 132* 134*  K 3.7 4.1  CL 97* 98  CO2 25 24  GLUCOSE 133* 111*  BUN 16 17  CREATININE 0.69 0.69  CALCIUM 9.0 8.8*   GFR: Estimated Creatinine Clearance: 73.3 mL/min (by C-G formula based on SCr of 0.69 mg/dL). Liver Function Tests: Recent Labs  Lab 05/04/19 0927 05/04/19 1220  AST 18 19  ALT 15 15  ALKPHOS 91 89  BILITOT 0.5 0.6  PROT 7.0 6.5  ALBUMIN 3.2* 3.0*   No results for input(s): LIPASE, AMYLASE in the last 168 hours. No results for input(s): AMMONIA  in the last 168 hours. Coagulation Profile: No results for input(s): INR, PROTIME in the last 168 hours. Cardiac Enzymes: No results for input(s): CKTOTAL, CKMB, CKMBINDEX, TROPONINI in the last 168 hours. BNP (last 3 results) No results for input(s): PROBNP in the last 8760 hours. HbA1C: No results for input(s): HGBA1C in the last 72 hours. CBG: No results for input(s): GLUCAP in the last 168 hours. Lipid Profile: No results for input(s): CHOL, HDL, LDLCALC, TRIG, CHOLHDL, LDLDIRECT in the last 72 hours. Thyroid Function Tests: No results for input(s): TSH, T4TOTAL, FREET4, T3FREE, THYROIDAB in the last 72 hours. Anemia Panel: No results for input(s): VITAMINB12, FOLATE, FERRITIN, TIBC, IRON, RETICCTPCT in the last 72 hours. Urine analysis: No results found for: COLORURINE, APPEARANCEUR, LABSPEC, [place], GLUCOSEU, HGBUR, BILIRUBINUR, KETONESUR, PROTEINUR, UROBILINOGEN, NITRITE, LEUKOCYTESUR   Radiological Exams on Admission: Ct Angio Chest Pe W And/or Wo Contrast  Result Date: 05/04/2019 CLINICAL DATA:  Shortness of breath, fever. History of left breast carcinoma. High pretest probability of pulmonary emboli. EXAM: CT ANGIOGRAPHY CHEST WITH CONTRAST TECHNIQUE: Multidetector CT imaging of the chest was performed using the standard protocol  during bolus administration of intravenous contrast. Multiplanar CT image reconstructions and MIPs were obtained to evaluate the vascular anatomy. CONTRAST:  135mL OMNIPAQUE IOHEXOL 350 MG/ML SOLN COMPARISON:  None. FINDINGS: Cardiovascular: Heart size upper limits normal. No pericardial effusion. Right subclavian port catheter to the distal SVC. RV/LV ratio 1.2. Dilated central pulmonary arteries. There is a central embolus in the distal right pulmonary artery extending into the upper and lower lobe branches. At least 2 incompletely occlusive left lower lobe segmental emboli extending into subsegmental branches. Moderate coronary calcifications. Adequate contrast opacification of the thoracic aorta with no evidence of dissection, aneurysm, or stenosis. There is classic 3-vessel brachiocephalic arch anatomy without proximal stenosis. Aortic Atherosclerosis (ICD10-170.0). Mediastinum/Nodes: Small hiatal hernia. No hilar or mediastinal adenopathy. Lungs/Pleura: No pleural effusion. No pneumothorax. Patchy airspace consolidation in the superior segment right lower lobe. Somewhat geographic ground-glass opacities in the upper lobes bilaterally. Upper Abdomen: 2.8 cm peripherally calcified stone in the non-distended gallbladder. Small hiatal hernia. No acute findings. Musculoskeletal: Skin thickening and subcutaneous inflammatory/edematous change in the left anterior chest wall suggesting prior radiation therapy. Anterior vertebral endplate spurring at multiple levels in the mid and lower thoracic spine. Bilateral shoulder DJD. No fracture or worrisome bone lesion. IMPRESSION: 1. POSITIVE for acute PE with CT evidence of right heart strain (RV/LV Ratio = 1.2) consistent with at least submassive (intermediate risk) PE. The presence of right heart strain has been associated with an increased risk of morbidity and mortality. Critical Value/emergent results were called by telephone at the time of interpretation on 05/04/2019  at 4:18 pm to provider Dr. Roderic Palau, who verbally acknowledged these results. 2. Aortic Atherosclerosis (ICD10-I70.0) and coronary artery disease. 3. Cholelithiasis. Electronically Signed   By: Lucrezia Europe M.D.   On: 05/04/2019 16:19   Dg Chest Portable 1 View  Result Date: 05/04/2019 CLINICAL DATA:  Fever and shortness of breath. Left breast cancer. EXAM: PORTABLE CHEST 1 VIEW COMPARISON:  01/30/2019 FINDINGS: Stable postmastectomy changes on the left with left axillary surgical clips. Borderline enlarged cardiac silhouette, magnified by poor inspiration and the portable AP technique. Clear lungs. Thoracic spine degenerative changes. IMPRESSION: No acute abnormality. Electronically Signed   By: Claudie Revering M.D.   On: 05/04/2019 12:12    EKG: Independently reviewed. Sinus rhythm, possible right heart strain [id number]  Assessment/Plan Active Problems:   Acute pulmonary embolism ([place])  Principal Problem Acute hypoxic respiratory failure due to acute pulmonary embolism  -She is clinically stable, requiring 2 L nasal cannula, blood pressure is stable.  Will admit to telemetry, provide IV heparin and if no bleeding and stable could potentially be transitioned to oral agents within 24 hours.  Obtain a 2D echo to evaluate RV  Active Problems Hypertension -Hold home antihypertensives  Type 2 diabetes mellitus -Hold metformin, place on sliding scale  Hyperlipidemia -Continue atorvastatin  Breast cancer -Likely the major risk factors for her PE   DVT prophylaxis: heparin  Code Status: Full code  Family Communication: no family at bedside  Disposition Plan: admit to telemetry, home when ready  Consults called: d/w patient     Marzetta Board, MD, PhD [facility]  Contact via [email or web address]  [facility] Office Info P: [phone] F: [phone]   05/04/2019, 5:20 PM

## 2019-05-04 NOTE — ED Notes (Signed)
EDP notified of 84/53. New orders for fluids

## 2019-05-04 NOTE — Progress Notes (Signed)
ANTICOAGULATION CONSULT NOTE - Initial Consult  Pharmacy Consult for heparin Indication: pulmonary embolus  No Known Allergies  Patient Measurements: Height: 5\' 4"  (162.6 cm) Weight: 166 lb (75.3 kg) IBW/kg (Calculated) : 54.7 Heparin Dosing Weight: 70 kg   Vital Signs: Temp: 99.2 F (37.3 C) (09/10 1511) Temp Source: Oral (09/10 1511) BP: 113/54 (09/10 1630) Pulse Rate: 91 (09/10 1630)  Labs: Recent Labs    05/04/19 0927 05/04/19 1220 05/04/19 1556  HGB 8.8* 8.2*  --   HCT 29.3* 27.3*  --   PLT 239 236  --   CREATININE 0.69 0.69  --   TROPONINIHS  --  155* 122*    Estimated Creatinine Clearance: 73.3 mL/min (by C-G formula based on SCr of 0.69 mg/dL).   Medical History: Past Medical History:  Diagnosis Date  . Cancer [facility])    stage 2 total mastctomy left breast dx march 2020  . Coronary artery disease   . Diabetes mellitus without complication ([place])   . Hypertension     Medications:  (Not in a hospital admission)   Assessment: Pharmacy consulted to dose heparin in patient with PE, confirmed by CT.  Patient is not on anticoagulation prior to admission.  Goal of Therapy:  Heparin level 0.3-0.7 units/ml Monitor platelets by anticoagulation protocol: Yes   Plan:  Give 4000 units bolus x 1 Start heparin infusion at 1100 units/hr Check anti-Xa level in 6-8 hours and daily while on heparin Continue to monitor H&H and platelets  Revonda Standard Dieter Hane 05/04/2019,5:14 PM

## 2019-05-04 NOTE — Progress Notes (Signed)
1000 Pt to tx area with complaints of increased dyspnea with minimal exertion and a dry cough. O2 sat 85% on RA. O2 started at 2L N/C.Labs drawn from portacath. Pt denies any pain or other issues. R.Nester NP in to assess pt.           1020 Temp 100 orally Pt denies any dyspnea at this time. O2 continued at 2 Liters                                                           1040 Reviewed lab results with RNester NP and chemotherapy to be held today and pt to be transferred to ER.NP informed the ER doctor of pt's condition and spoke with the pt who verbalized understanding                                                                            1045 Report given to ER charge nurse and pt transported to ER via wheelchair in stable condition with O2 at 2 liters via N/C. Portacath left accessed with dressing clean,dry and intact

## 2019-05-04 NOTE — ED Notes (Signed)
Date and time results received: 05/04/19 1353 (use smartphrase ".now" to insert current time)  Test: Troponin Critical Value: 155  Name of Provider Notified:Dr Long  Orders Received? Or Actions Taken?: NA

## 2019-05-04 NOTE — ED Provider Notes (Signed)
Emergency Department Provider Note   I have reviewed the triage vital signs and the nursing notes.   HISTORY  Chief Complaint Shortness of Breath   HPI Bridget Richardson is a 63 y.o. female with PMH of stage II breast cancer currently on chemotherapy, DM, and HTN presents to the emergency department for evaluation of exertional dyspnea.  She last had chemotherapy last week.  She has not noticed fever, chills, chest pain, heart palpitations.  She went to her infusion appointment this morning and was found to be hypoxic with reported room air saturation of 80%.  She was placed on 2 L nasal cannula and and referred to the emergency department.  Patient states that when at rest she is not short of breath but has noticed dyspnea and having to take breaks with walking.  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 She does not have direct patient contact, however. No abdominal pain, dysuria, diarrhea, or vomiting.    Past Medical History:  Diagnosis Date   Cancer [facility])    stage 2 total mastctomy left breast dx march 2020   Coronary artery disease    Diabetes mellitus without complication ([place])    Hypertension     Patient Active Problem List   Diagnosis Date Noted   Acute pulmonary embolism ([place]) 05/04/2019   Malignant neoplasm of female breast ([place]) 01/04/2019   Malignant neoplasm of upper-outer quadrant of left breast in female, estrogen receptor positive ([place])     Past Surgical History:  Procedure Laterality Date   ANKLE SURGERY Right 2009   MASTECTOMY MODIFIED RADICAL Left 01/04/2019   Procedure: MASTECTOMY MODIFIED RADICAL;  Surgeon: Aviva Signs, MD;  Location: [facility];  Service: General;  Laterality: Left;   PORTACATH PLACEMENT Right 01/30/2019   Procedure: INSERTION PORT-A-CATH;  Surgeon: Aviva Signs, MD;  Location: [facility];  Service: General;  Laterality: Right;   WRIST SURGERY  Right 1997    Allergies Other  History reviewed. No pertinent family history.  Social History Social History   Tobacco Use   Smoking status: Former Smoker    Packs/day: 0.25    Years: 2.00    Pack years: 0.50    Types: Cigarettes    Quit date: 1983    Years since quitting: 37.7   Smokeless tobacco: Never Used  Substance Use Topics   Alcohol use: Never    Frequency: Never   Drug use: Never    Review of Systems  Constitutional: Positive fever/chills Eyes: No visual changes. ENT: No sore throat. Cardiovascular: Denies chest pain. Respiratory: Positive exertional shortness of breath. Gastrointestinal: No abdominal pain.  No nausea, no vomiting.  No diarrhea.  No constipation. Genitourinary: Negative for dysuria. Musculoskeletal: Negative for back pain. Skin: Negative for rash. Neurological: Negative for headaches, focal weakness or numbness.  10-point ROS otherwise negative.  ____________________________________________   PHYSICAL EXAM:  VITAL SIGNS: ED Triage Vitals  Enc Vitals Group     BP 05/04/19 1107 111/61     Pulse Rate 05/04/19 1107 (!) 101     Resp 05/04/19 1107 16     Temp 05/04/19 1107 100 F (37.8 C)     Temp Source 05/04/19 1107 Oral     SpO2 05/04/19 1107 96 %     Weight 05/04/19 1111 166 lb (75.3 kg)     Height 05/04/19 1111 5\' 4"  (1.626 m)   Constitutional: Alert and oriented. Well appearing and in  no acute distress. Eyes: Conjunctivae are normal.  Head: Atraumatic. Nose: No congestion/rhinnorhea. Mouth/Throat: Mucous membranes are moist.  Oropharynx non-erythematous. Neck: No stridor.  Cardiovascular: Tachycardia. Good peripheral circulation. Grossly normal heart sounds.   Respiratory: Normal respiratory effort.  No retractions. Lungs CTAB. Gastrointestinal: Soft and nontender. No distention.  Musculoskeletal: No lower extremity tenderness nor edema.  Neurologic:  Normal speech and language.  Skin:  Skin is warm, dry and intact.  No rash noted.  ____________________________________________   LABS (all labs ordered are listed, but only abnormal results are displayed)  Labs Reviewed  COMPREHENSIVE METABOLIC PANEL - Abnormal; Notable for the following components:      Result Value   Sodium 134 (*)    Glucose, Bld 111 (*)    Calcium 8.8 (*)    Albumin 3.0 (*)    All other components within normal limits  CBC WITH DIFFERENTIAL/PLATELET - Abnormal; Notable for the following components:   RBC 2.80 (*)    Hemoglobin 8.2 (*)    HCT 27.3 (*)    RDW 17.8 (*)    All other components within normal limits  URINALYSIS, ROUTINE W REFLEX MICROSCOPIC - Abnormal; Notable for the following components:   Specific Gravity, Urine 1.040 (*)    Leukocytes,Ua SMALL (*)    Bacteria, UA RARE (*)    All other components within normal limits  CBC - Abnormal; Notable for the following components:   RBC 2.62 (*)    Hemoglobin 7.5 (*)    HCT 25.3 (*)    MCHC 29.6 (*)    RDW 18.1 (*)    All other components within normal limits  COMPREHENSIVE METABOLIC PANEL - Abnormal; Notable for the following components:   Sodium 134 (*)    Glucose, Bld 104 (*)    Calcium 8.4 (*)    Total Protein 5.9 (*)    Albumin 2.7 (*)    All other components within normal limits  CBG MONITORING, ED - Abnormal; Notable for the following components:   Glucose-Capillary 158 (*)    All other components within normal limits  TROPONIN I (HIGH SENSITIVITY) - Abnormal; Notable for the following components:   Troponin I (High Sensitivity) 155 (*)    All other components within normal limits  TROPONIN I (HIGH SENSITIVITY) - Abnormal; Notable for the following components:   Troponin I (High Sensitivity) 122 (*)    All other components within normal limits  CULTURE, BLOOD (ROUTINE X 2)  SARS CORONAVIRUS 2 (HOSPITAL ORDER, [place] [facility])  URINE CULTURE  LACTIC ACID, PLASMA  LACTIC ACID, PLASMA  HEPARIN LEVEL (UNFRACTIONATED)  HIV  ANTIBODY (ROUTINE TESTING W REFLEX)  HEPARIN LEVEL (UNFRACTIONATED)   ____________________________________________  EKG   EKG Interpretation  Date/Time:  Thursday May 04 2019 12:05:58 EDT Ventricular Rate:  98 PR Interval:    QRS Duration: 90 QT Interval:  322 QTC Calculation: 412 R Axis:   41 Text Interpretation:  Sinus rhythm Borderline T abnormalities, inferior leads No STEMI  Confirmed by Nanda Quinton [id number]) on 05/05/2019 7:37:37 AM       ____________________________________________  RADIOLOGY  Ct Angio Chest Pe W And/or Wo Contrast  Result Date: 05/04/2019 CLINICAL DATA:  Shortness of breath, fever. History of left breast carcinoma. High pretest probability of pulmonary emboli. EXAM: CT ANGIOGRAPHY CHEST WITH CONTRAST TECHNIQUE: Multidetector CT imaging of the chest was performed using the standard protocol during bolus administration of intravenous contrast. Multiplanar CT image reconstructions and MIPs were obtained to evaluate the  vascular anatomy. CONTRAST:  165mL OMNIPAQUE IOHEXOL 350 MG/ML SOLN COMPARISON:  None. FINDINGS: Cardiovascular: Heart size upper limits normal. No pericardial effusion. Right subclavian port catheter to the distal SVC. RV/LV ratio 1.2. Dilated central pulmonary arteries. There is a central embolus in the distal right pulmonary artery extending into the upper and lower lobe branches. At least 2 incompletely occlusive left lower lobe segmental emboli extending into subsegmental branches. Moderate coronary calcifications. Adequate contrast opacification of the thoracic aorta with no evidence of dissection, aneurysm, or stenosis. There is classic 3-vessel brachiocephalic arch anatomy without proximal stenosis. Aortic Atherosclerosis (ICD10-170.0). Mediastinum/Nodes: Small hiatal hernia. No hilar or mediastinal adenopathy. Lungs/Pleura: No pleural effusion. No pneumothorax. Patchy airspace consolidation in the superior segment right lower lobe. Somewhat  geographic ground-glass opacities in the upper lobes bilaterally. Upper Abdomen: 2.8 cm peripherally calcified stone in the non-distended gallbladder. Small hiatal hernia. No acute findings. Musculoskeletal: Skin thickening and subcutaneous inflammatory/edematous change in the left anterior chest wall suggesting prior radiation therapy. Anterior vertebral endplate spurring at multiple levels in the mid and lower thoracic spine. Bilateral shoulder DJD. No fracture or worrisome bone lesion. IMPRESSION: 1. POSITIVE for acute PE with CT evidence of right heart strain (RV/LV Ratio = 1.2) consistent with at least submassive (intermediate risk) PE. The presence of right heart strain has been associated with an increased risk of morbidity and mortality. Critical Value/emergent results were called by telephone at the time of interpretation on 05/04/2019 at 4:18 pm to provider Dr. Roderic Palau, who verbally acknowledged these results. 2. Aortic Atherosclerosis (ICD10-I70.0) and coronary artery disease. 3. Cholelithiasis. Electronically Signed   By: Lucrezia Europe M.D.   On: 05/04/2019 16:19   Dg Chest Portable 1 View  Result Date: 05/04/2019 CLINICAL DATA:  Fever and shortness of breath. Left breast cancer. EXAM: PORTABLE CHEST 1 VIEW COMPARISON:  01/30/2019 FINDINGS: Stable postmastectomy changes on the left with left axillary surgical clips. Borderline enlarged cardiac silhouette, magnified by poor inspiration and the portable AP technique. Clear lungs. Thoracic spine degenerative changes. IMPRESSION: No acute abnormality. Electronically Signed   By: Claudie Revering M.D.   On: 05/04/2019 12:12    ____________________________________________   PROCEDURES  Procedure(s) performed:   Procedures  CRITICAL CARE Performed by: Margette Fast Total critical care time: 50 minutes Critical care time was exclusive of separately billable procedures and treating other patients. Critical care was necessary to treat or prevent  imminent or life-threatening deterioration. Critical care was time spent personally by me on the following activities: development of treatment plan with patient and/or surrogate as well as nursing, discussions with consultants, evaluation of patient's response to treatment, examination of patient, obtaining history from patient or surrogate, ordering and performing treatments and interventions, ordering and review of laboratory studies, ordering and review of radiographic studies, pulse oximetry and re-evaluation of patient's condition.  Nanda Quinton, MD Emergency Medicine  ____________________________________________   INITIAL IMPRESSION / ASSESSMENT AND PLAN / ED COURSE  Pertinent labs & imaging results that were available during my care of the patient were reviewed by me and considered in my medical decision making (see chart for details).   Patient presents to the emergency department for evaluation of shortness of breath mostly with exertion.  She was found to be hypoxemic at her outpatient infusion clinic appointment today with reported oxygen saturation of 80% on room air.  She is currently on 2 L nasal cannula.  No chest pain.  She is actively on chemotherapy.  Plan for sepsis labs including cultures  along with COVID testing.   CXR and initial labs reviewed. Mild troponin elevation. Suspicion remains high for PE. Care transferred to Dr. Roderic Palau pending CTA results. Anticipate admit. Hld on abx pending CT. COVID negative.  ____________________________________________  FINAL CLINICAL IMPRESSION(S) / ED DIAGNOSES  Final diagnoses:  Acute septic pulmonary embolism with acute cor pulmonale (HCC)     MEDICATIONS GIVEN DURING THIS VISIT:  Medications  heparin ADULT infusion 100 units/mL (25000 units/257mL sodium chloride 0.45%) (1,100 Units/hr Intravenous New Bag/Given 05/04/19 1745)  atorvastatin (LIPITOR) tablet 40 mg (40 mg Oral Given 05/04/19 2139)  traZODone (DESYREL) tablet 50-100  mg (has no administration in time range)  ondansetron (ZOFRAN) tablet 4 mg (has no administration in time range)  ferrous sulfate tablet 325 mg (has no administration in time range)  acetaminophen (TYLENOL) tablet 650 mg (650 mg Oral Given 05/05/19 0549)    Or  acetaminophen (TYLENOL) suppository 650 mg ( Rectal See Alternative 05/05/19 0549)  insulin aspart (novoLOG) injection 0-9 Units (2 Units Subcutaneous Given 05/04/19 1826)  sodium chloride 0.9 % bolus 500 mL (0 mLs Intravenous Stopped 05/04/19 1319)  acetaminophen (TYLENOL) tablet 1,000 mg (1,000 mg Oral Given 05/04/19 1159)  sodium chloride 0.9 % bolus 1,000 mL (0 mLs Intravenous Stopped 05/04/19 1618)  iohexol (OMNIPAQUE) 350 MG/ML injection 100 mL (100 mLs Intravenous Contrast Given 05/04/19 1539)  heparin bolus via infusion 4,000 Units (4,000 Units Intravenous Bolus from Bag 05/04/19 1716)    Note:  This document was prepared using Dragon voice recognition software and may include unintentional dictation errors.  Nanda Quinton, MD Emergency Medicine    Zayana Salvador, [facility], MD 05/05/19 [phone]

## 2019-05-05 ENCOUNTER — Other Ambulatory Visit: Payer: Self-pay

## 2019-05-05 ENCOUNTER — Inpatient Hospital Stay (HOSPITAL_COMMUNITY): Payer: PRIVATE HEALTH INSURANCE

## 2019-05-05 ENCOUNTER — Encounter (HOSPITAL_COMMUNITY): Payer: Self-pay | Admitting: *Deleted

## 2019-05-05 DIAGNOSIS — E119 Type 2 diabetes mellitus without complications: Secondary | ICD-10-CM | POA: Diagnosis present

## 2019-05-05 DIAGNOSIS — C50412 Malignant neoplasm of upper-outer quadrant of left female breast: Secondary | ICD-10-CM | POA: Diagnosis present

## 2019-05-05 DIAGNOSIS — Z170 Estrogen receptor positive status [ER+]: Secondary | ICD-10-CM | POA: Diagnosis not present

## 2019-05-05 DIAGNOSIS — Z20828 Contact with and (suspected) exposure to other viral communicable diseases: Secondary | ICD-10-CM | POA: Diagnosis present

## 2019-05-05 DIAGNOSIS — D6481 Anemia due to antineoplastic chemotherapy: Secondary | ICD-10-CM | POA: Diagnosis present

## 2019-05-05 DIAGNOSIS — I10 Essential (primary) hypertension: Secondary | ICD-10-CM | POA: Diagnosis present

## 2019-05-05 DIAGNOSIS — T451X5A Adverse effect of antineoplastic and immunosuppressive drugs, initial encounter: Secondary | ICD-10-CM | POA: Diagnosis present

## 2019-05-05 DIAGNOSIS — Z87891 Personal history of nicotine dependence: Secondary | ICD-10-CM | POA: Diagnosis not present

## 2019-05-05 DIAGNOSIS — Z9012 Acquired absence of left breast and nipple: Secondary | ICD-10-CM | POA: Diagnosis not present

## 2019-05-05 DIAGNOSIS — Z79899 Other long term (current) drug therapy: Secondary | ICD-10-CM | POA: Diagnosis not present

## 2019-05-05 DIAGNOSIS — I2601 Septic pulmonary embolism with acute cor pulmonale: Secondary | ICD-10-CM

## 2019-05-05 DIAGNOSIS — I2699 Other pulmonary embolism without acute cor pulmonale: Secondary | ICD-10-CM

## 2019-05-05 DIAGNOSIS — J40 Bronchitis, not specified as acute or chronic: Secondary | ICD-10-CM | POA: Diagnosis present

## 2019-05-05 DIAGNOSIS — I2510 Atherosclerotic heart disease of native coronary artery without angina pectoris: Secondary | ICD-10-CM | POA: Diagnosis present

## 2019-05-05 DIAGNOSIS — I472 Ventricular tachycardia: Secondary | ICD-10-CM | POA: Diagnosis present

## 2019-05-05 DIAGNOSIS — R42 Dizziness and giddiness: Secondary | ICD-10-CM | POA: Diagnosis present

## 2019-05-05 DIAGNOSIS — M19011 Primary osteoarthritis, right shoulder: Secondary | ICD-10-CM | POA: Diagnosis present

## 2019-05-05 DIAGNOSIS — M19012 Primary osteoarthritis, left shoulder: Secondary | ICD-10-CM | POA: Diagnosis present

## 2019-05-05 DIAGNOSIS — E785 Hyperlipidemia, unspecified: Secondary | ICD-10-CM | POA: Diagnosis present

## 2019-05-05 DIAGNOSIS — D649 Anemia, unspecified: Secondary | ICD-10-CM | POA: Diagnosis not present

## 2019-05-05 DIAGNOSIS — J9601 Acute respiratory failure with hypoxia: Secondary | ICD-10-CM | POA: Diagnosis present

## 2019-05-05 LAB — CBC
HCT: 25.3 % — ABNORMAL LOW (ref 36.0–46.0)
Hemoglobin: 7.5 g/dL — ABNORMAL LOW (ref 12.0–15.0)
MCH: 28.6 pg (ref 26.0–34.0)
MCHC: 29.6 g/dL — ABNORMAL LOW (ref 30.0–36.0)
MCV: 96.6 fL (ref 80.0–100.0)
Platelets: 228 10*3/uL (ref 150–400)
RBC: 2.62 MIL/uL — ABNORMAL LOW (ref 3.87–5.11)
RDW: 18.1 % — ABNORMAL HIGH (ref 11.5–15.5)
WBC: 6.7 10*3/uL (ref 4.0–10.5)
nRBC: 0 % (ref 0.0–0.2)

## 2019-05-05 LAB — GLUCOSE, CAPILLARY
Glucose-Capillary: 105 mg/dL — ABNORMAL HIGH (ref 70–99)
Glucose-Capillary: 110 mg/dL — ABNORMAL HIGH (ref 70–99)
Glucose-Capillary: 103 mg/dL — ABNORMAL HIGH (ref 70–99)

## 2019-05-05 LAB — COMPREHENSIVE METABOLIC PANEL
ALT: 13 U/L (ref 0–44)
AST: 18 U/L (ref 15–41)
Albumin: 2.7 g/dL — ABNORMAL LOW (ref 3.5–5.0)
Alkaline Phosphatase: 84 U/L (ref 38–126)
Anion gap: 11 (ref 5–15)
BUN: 14 mg/dL (ref 8–23)
CO2: 23 mmol/L (ref 22–32)
Calcium: 8.4 mg/dL — ABNORMAL LOW (ref 8.9–10.3)
Chloride: 100 mmol/L (ref 98–111)
Creatinine, Ser: 0.61 mg/dL (ref 0.44–1.00)
GFR calc Af Amer: >60 mL/min (ref >60)
GFR calc non Af Amer: >60 mL/min (ref >60)
Glucose, Bld: 104 mg/dL — ABNORMAL HIGH (ref 70–99)
Potassium: 3.7 mmol/L (ref 3.5–5.1)
Sodium: 134 mmol/L — ABNORMAL LOW (ref 135–145)
Total Bilirubin: 0.5 mg/dL (ref 0.3–1.2)
Total Protein: 5.9 g/dL — ABNORMAL LOW (ref 6.5–8.1)

## 2019-05-05 LAB — ECHOCARDIOGRAM COMPLETE
Height: 64 in
Weight: 2656 oz

## 2019-05-05 LAB — HEPARIN LEVEL (UNFRACTIONATED)
Heparin Unfractionated: 0.41 IU/mL (ref 0.30–0.70)
Heparin Unfractionated: 0.45 IU/mL (ref 0.30–0.70)

## 2019-05-05 MED ORDER — SODIUM CHLORIDE 0.9 % IV SOLN
INTRAVENOUS | Status: DC
  Administered 2019-05-05 – 2019-05-06 (×2): via INTRAVENOUS

## 2019-05-05 MED ORDER — TRAZODONE HCL 50 MG PO TABS
50.0000 mg | ORAL_TABLET | Freq: Every day | ORAL | Status: DC
  Administered 2019-05-05 – 2019-05-08 (×4): 50 mg via ORAL
  Filled 2019-05-05 (×4): qty 1

## 2019-05-05 MED ORDER — CARVEDILOL 3.125 MG PO TABS
3.1250 mg | ORAL_TABLET | Freq: Two times a day (BID) | ORAL | Status: DC
  Administered 2019-05-05 – 2019-05-09 (×9): 3.125 mg via ORAL
  Filled 2019-05-05 (×9): qty 1

## 2019-05-05 NOTE — Progress Notes (Signed)
Bridget Richardson for Heparin Indication: pulmonary embolus  Allergies  Allergen Reactions  . Other     No Meat- Patient is vegetarian    Patient Measurements: Height: 5\' 4"  (162.6 cm) Weight: 166 lb (75.3 kg) IBW/kg (Calculated) : 54.7 Heparin Dosing Weight: 70 kg   Vital Signs: Temp: 100.9 F (38.3 C) (09/11 0546) Temp Source: Oral (09/11 0546) BP: 127/58 (09/11 0546) Pulse Rate: 98 (09/11 0546)  Labs: Recent Labs    05/04/19 0927 05/04/19 1220 05/04/19 1556 05/05/19 0016 05/05/19 0852  HGB 8.8* 8.2*  --  7.5*  --   HCT 29.3* 27.3*  --  25.3*  --   PLT 239 236  --  228  --   HEPARINUNFRC  --   --   --  0.41 0.45  CREATININE 0.69 0.69  --  0.61  --   TROPONINIHS  --  155* 122*  --   --     Estimated Creatinine Clearance: 73.3 mL/min (by C-G formula based on SCr of 0.61 mg/dL).    Assessment: Pharmacy consulted to dose heparin in patient with PE, confirmed by CT.  Patient was not on anticoagulation prior to admission.  9/11 0900 AM update:   Heparin level therapeutic at 0.45 IU/mL Hg 7.5   (watch closely)  Goal of Therapy:  Heparin level 0.3-0.7 units/ml Monitor platelets by anticoagulation protocol: Yes   Plan:  Continue  heparin at 1100 units/hr Daily heparin level  Monitor CBC and sign/symptoms of bleeding.  Despina Pole, Pharm. D. Clinical Pharmacist 05/05/2019 10:08 AM

## 2019-05-05 NOTE — Progress Notes (Signed)
[place] for Heparin Indication: pulmonary embolus  Allergies  Allergen Reactions  . Other     No Meat- Patient is vegetarian    Patient Measurements: Height: 5\' 4"  (162.6 cm) Weight: 166 lb (75.3 kg) IBW/kg (Calculated) : 54.7 Heparin Dosing Weight: 70 kg   Vital Signs: Temp: 98.6 F (37 C) (09/10 1915) Temp Source: Oral (09/10 1915) BP: 128/61 (09/10 1915) Pulse Rate: 98 (09/10 1915)  Labs: Recent Labs    05/04/19 0927 05/04/19 1220 05/04/19 1556 05/05/19 0016  HGB 8.8* 8.2*  --  7.5*  HCT 29.3* 27.3*  --  25.3*  PLT 239 236  --  228  HEPARINUNFRC  --   --   --  0.41  CREATININE 0.69 0.69  --  0.61  TROPONINIHS  --  155* 122*  --     Estimated Creatinine Clearance: 73.3 mL/min (by C-G formula based on SCr of 0.61 mg/dL).   Medical History: Past Medical History:  Diagnosis Date  . Cancer [facility])    stage 2 total mastctomy left breast dx march 2020  . Coronary artery disease   . Diabetes mellitus without complication ([place])   . Hypertension     Medications:  Medications Prior to Admission  Medication Sig Dispense Refill Last Dose  . atorvastatin (LIPITOR) 40 MG tablet Take 40 mg by mouth at bedtime.    05/03/2019 at Unknown time  . carvedilol (COREG) 6.25 MG tablet Take 6.25 mg by mouth See admin instructions. Take 6.25 mg every Mon, Tues, Wed, Fri, Sat, Sun and 12.5 mg every Thursday   05/04/2019 at 0810  . enalapril (VASOTEC) 20 MG tablet Take 20 mg by mouth 2 (two) times daily.   05/04/2019 at Unknown time  . ferrous sulfate 325 (65 FE) MG tablet Take 325 mg by mouth daily with breakfast.   05/04/2019 at Unknown time  . lidocaine-prilocaine (EMLA) cream Apply small amount to port a cath site and cover with plastic wrap 1 hour prior to chemotherapy appointments 30 g 2 05/04/2019 at Unknown time  . metFORMIN (GLUCOPHAGE) 850 MG tablet Take 850 mg by mouth daily with breakfast.    05/04/2019 at Unknown time  . PACLITAXEL IV  Inject into the vein once a week.   04/27/2019 at Unknown time  . traZODone (DESYREL) 100 MG tablet Take 1 tablet (100 mg total) by mouth at bedtime as needed for sleep. (Patient taking differently: Take 50-100 mg by mouth at bedtime as needed for sleep. ) 30 tablet 2 05/03/2019 at Unknown time  . CYCLOPHOSPHAMIDE IV Inject into the vein every 14 (fourteen) days.   Not Taking at Unknown time  . dexamethasone (DECADRON) 4 MG tablet Take 8 mg by mouth 2 (two) times daily with a meal.   Not Taking at Unknown time  . ondansetron (ZOFRAN) 4 MG tablet Take 4 mg by mouth every 8 (eight) hours as needed for nausea or vomiting.   Not Taking at Unknown time  . prochlorperazine (COMPAZINE) 10 MG tablet Take 1 tablet (10 mg total) by mouth every 6 (six) hours as needed (Nausea or vomiting). (Patient not taking: Reported on 05/04/2019) 30 tablet 1 Not Taking at Unknown time  . tiZANidine (ZANAFLEX) 4 MG tablet TAKE 1 TABLET BY MOUTH THREE TIMES A DAY AS NEEDED FOR FLANK PAIN   Not Taking at Unknown time    Assessment: Pharmacy consulted to dose heparin in patient with PE, confirmed by CT.  Patient is not on  anticoagulation prior to admission.  9/11 AM update:  Initial heparin level therapeutic  Hgb down some (watch closely)  Goal of Therapy:  Heparin level 0.3-0.7 units/ml Monitor platelets by anticoagulation protocol: Yes   Plan:  Cont heparin at 1100 units/hr Confirmatory heparin level in 8 hours  Narda Bonds, PharmD, [place] Pharmacist Phone: [phone]

## 2019-05-05 NOTE — Progress Notes (Signed)
Patient Demographics:    Bridget Richardson, is a 63 y.o. female, DOB - 06/27/1956, [id number]  Admit date - 05/04/2019   Admitting Physician Costin Karlyne Greenspan, MD  Outpatient Primary MD for the patient is [name], MD  LOS - 0   Chief Complaint  Patient presents with  . Shortness of Breath        Subjective:    [employer] today has no emesis,  No chest pain,   -Fevers and dyspnea persist  Assessment  & Plan :    Principal Problem:   Acute pulmonary embolism (HCC) Active Problems:   Malignant neoplasm of female breast (HCC)   HTN (hypertension)   Diabetes mellitus ([place])  Brief Summary  63 y.o. female with medical history significant for breast cancer, DM2, HTN, HLD, CAD  admitted on 19 2020 with dyspnea and fevers and found to have bilateral pulmonary embolism   A/p 1)Bil PE--patient bilateral pulmonary embolism, dyspnea persist , also continues to have significant dyspnea on exertion--- some dizziness -Continue IV heparin, plan to transition also DOAC when cardiorespiratory symptoms improve -Echo with preserved EF of 60 to 65% -Given underlying malignancy --- patient most likely need lifelong anticoagulation  2)HTN--restart low-dose Coreg, hold enalapril due to recent contrast study  3)DM2--hold metformin due to recent contrast study, Use Novolog/Humalog Sliding scale insulin with Accu-Cheks/Fingersticks as ordered  4) acute on chronic anemia--- patient 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 for ongoing chemoRx   6)Fevers--may be inflammatory from PE versus infectious, blood cultures pending  Disposition/Need for in-Hospital Stay- patient unable to be discharged at this time due to symptomatic pulmonary embolism with significant dyspnea requiring IV heparin infusion--  Code Status : Full  Family Communication:   NA (patient is alert, awake and coherent)  Disposition Plan  : TBD   Consults  :  NA  DVT Prophylaxis  :  Iv Heparin  Lab Results  Component Value Date   PLT 228 05/05/2019    Inpatient Medications  Scheduled Meds: . atorvastatin  40 mg Oral QHS  . carvedilol  3.125 mg Oral BID WC  . ferrous sulfate  325 mg Oral Q breakfast  . insulin aspart  0-9 Units Subcutaneous TID WC   Continuous Infusions: . heparin 1,100 Units/hr (05/05/19 1132)   PRN Meds:.acetaminophen **OR** acetaminophen, ondansetron, traZODone    Anti-infectives (From admission, onward)   None        Objective:   Vitals:   05/04/19 1828 05/04/19 1915 05/05/19 0546 05/05/19 1442  BP: 114/66 128/61 (!) 127/58 127/61  Pulse: 97 98 98 (!) 102  Resp: 20 18 (!) 21 18  Temp: 99 F (37.2 C) 98.6 F (37 C) (!) 100.9 F (38.3 C) (!) 100.6 F (38.1 C)  TempSrc: Oral Oral Oral Oral  SpO2: 100% 100% 96% 95%  Weight:      Height:        Wt Readings from Last  3 Encounters:  05/04/19 75.3 kg  05/04/19 75.4 kg  04/27/19 79.2 kg     Intake/Output Summary (Last 24 hours) at 05/05/2019 1608 Last data filed at 05/05/2019 0900 Gross per 24 hour  Intake 1841.75 ml  Output -  Net 1841.75 ml     Physical Exam  Gen:- Awake Alert, dyspnea on minimal exertion HEENT:- [place].AT, No sclera icterus Neck-Supple Neck,No JVD,.  Lungs-dyspnea on exertion, diminished breath sounds bilaterally CV- S1, S2 normal, regular, tachycardic, dizziness Abd-  +ve B.Sounds, Abd Soft, No tenderness,    Extremity/Skin:- No  edema, pedal pulses present  Psych-affect is appropriate, oriented x3 Neuro-no new focal deficits, no  tremors   Data Review:   Micro Results Recent Results (from the past 240 hour(s))  SARS Coronavirus 2 [facility] order, Performed in [facility] hospital lab) Nasopharyngeal Nasopharyngeal Swab     Status: None   Collection Time: 05/04/19 12:18 PM   Specimen: Nasopharyngeal Swab  Result Value Ref Range Status   SARS Coronavirus 2 NEGATIVE NEGATIVE Final    Comment: (NOTE) If result is NEGATIVE SARS-CoV-2 target nucleic acids are NOT DETECTED. The SARS-CoV-2 RNA is generally detectable in upper and lower  respiratory specimens during the acute phase of infection. The lowest  concentration of SARS-CoV-2 viral copies this assay can detect is 250  copies / mL. A negative result does not preclude SARS-CoV-2 infection  and should not be used as the sole basis for treatment or other  patient management decisions.  A negative result may occur with  improper specimen collection / handling, submission of specimen other  than nasopharyngeal swab, presence of viral mutation(s) within the  areas targeted by this assay, and inadequate number of viral copies  (<250 copies / mL). A negative result must be combined with clinical  observations, patient history, and epidemiological information. If result is POSITIVE SARS-CoV-2 target nucleic acids are DETECTED. The SARS-CoV-2 RNA is generally detectable in upper and lower  respiratory specimens dur ing the acute phase of infection.  Positive  results are indicative of active infection with SARS-CoV-2.  Clinical  correlation with patient history and other diagnostic information is  necessary to determine patient infection status.  Positive results do  not rule out bacterial infection or co-infection with other viruses. If result is PRESUMPTIVE POSTIVE SARS-CoV-2 nucleic acids MAY BE PRESENT.   A presumptive positive result was obtained on the submitted specimen  and confirmed on repeat testing.  While 2019 novel coronavirus  (SARS-CoV-2) nucleic  acids may be present in the submitted sample  additional confirmatory testing may be necessary for epidemiological  and / or clinical management purposes  to differentiate between  SARS-CoV-2 and other Sarbecovirus currently known to infect humans.  If clinically indicated additional testing with an alternate test  methodology [phone]) is advised. The SARS-CoV-2 RNA is generally  detectable in upper and lower respiratory sp ecimens during the acute  phase of infection. The expected result is Negative. Fact Sheet for Patients:  StrictlyIdeas.no Fact Sheet for Healthcare Providers: BankingDealers.co.za This test is not yet approved or cleared by the [place] FDA and has been authorized for detection and/or diagnosis of SARS-CoV-2 by FDA under an Emergency Use Authorization (EUA).  This EUA will remain in effect (meaning this test can be used) for the duration of the COVID-19 declaration under Section 564(b)(1) of the Act, 21 U.S.C. section 360bbb-3(b)(1), unless the authorization is terminated or revoked sooner. Performed at [facility], [address] [place], [place] [address]  Culture, blood (routine x 2)     Status: None (Preliminary result)   Collection Time: 05/04/19 12:25 PM   Specimen: BLOOD  Result Value Ref Range Status   Specimen Description BLOOD RIGHT ANTECUBITAL  Final   Special Requests   Final    BOTTLES DRAWN AEROBIC AND ANAEROBIC Blood Culture adequate volume   Culture   Final    NO GROWTH < 24 HOURS Performed at [facility], [address], [place], [place] [address]    Report Status PENDING  Incomplete    Radiology Reports Ct Angio Chest Pe W And/or Wo Contrast  Result Date: 05/04/2019 CLINICAL DATA:  Shortness of breath, fever. History of left breast carcinoma. High pretest probability of pulmonary emboli. EXAM: CT ANGIOGRAPHY CHEST WITH CONTRAST TECHNIQUE: Multidetector CT imaging of the chest was  performed using the standard protocol during bolus administration of intravenous contrast. Multiplanar CT image reconstructions and MIPs were obtained to evaluate the vascular anatomy. CONTRAST:  1110mL OMNIPAQUE IOHEXOL 350 MG/ML SOLN COMPARISON:  None. FINDINGS: Cardiovascular: Heart size upper limits normal. No pericardial effusion. Right subclavian port catheter to the distal SVC. RV/LV ratio 1.2. Dilated central pulmonary arteries. There is a central embolus in the distal right pulmonary artery extending into the upper and lower lobe branches. At least 2 incompletely occlusive left lower lobe segmental emboli extending into subsegmental branches. Moderate coronary calcifications. Adequate contrast opacification of the thoracic aorta with no evidence of dissection, aneurysm, or stenosis. There is classic 3-vessel brachiocephalic arch anatomy without proximal stenosis. Aortic Atherosclerosis (ICD10-170.0). Mediastinum/Nodes: Small hiatal hernia. No hilar or mediastinal adenopathy. Lungs/Pleura: No pleural effusion. No pneumothorax. Patchy airspace consolidation in the superior segment right lower lobe. Somewhat geographic ground-glass opacities in the upper lobes bilaterally. Upper Abdomen: 2.8 cm peripherally calcified stone in the non-distended gallbladder. Small hiatal hernia. No acute findings. Musculoskeletal: Skin thickening and subcutaneous inflammatory/edematous change in the left anterior chest wall suggesting prior radiation therapy. Anterior vertebral endplate spurring at multiple levels in the mid and lower thoracic spine. Bilateral shoulder DJD. No fracture or worrisome bone lesion. IMPRESSION: 1. POSITIVE for acute PE with CT evidence of right heart strain (RV/LV Ratio = 1.2) consistent with at least submassive (intermediate risk) PE. The presence of right heart strain has been associated with an increased risk of morbidity and mortality. Critical Value/emergent results were called by telephone at  the time of interpretation on 05/04/2019 at 4:18 pm to provider Dr. Roderic Palau, who verbally acknowledged these results. 2. Aortic Atherosclerosis (ICD10-I70.0) and coronary artery disease. 3. Cholelithiasis. Electronically Signed   By: Lucrezia Europe M.D.   On: 05/04/2019 16:19   Dg Chest Portable 1 View  Result Date: 05/04/2019 CLINICAL DATA:  Fever and shortness of breath. Left breast cancer. EXAM: PORTABLE CHEST 1 VIEW COMPARISON:  01/30/2019 FINDINGS: Stable postmastectomy changes on the left with left axillary surgical clips. Borderline enlarged cardiac silhouette, magnified by poor inspiration and the portable AP technique. Clear lungs. Thoracic spine degenerative changes. IMPRESSION: No acute abnormality. Electronically Signed   By: Claudie Revering M.D.   On: 05/04/2019 12:12     CBC Recent Labs  Lab 05/04/19 0927 05/04/19 1220 05/05/19 0016  WBC 8.9 8.9 6.7  HGB 8.8* 8.2* 7.5*  HCT 29.3* 27.3* 25.3*  PLT 239 236 228  MCV 96.4 97.5 96.6  MCH 28.9 29.3 28.6  MCHC 30.0 30.0 29.6*  RDW 17.9* 17.8* 18.1*  LYMPHSABS 0.4* 0.7  --   MONOABS 0.8 0.7  --   EOSABS 0.3  0.2  --   BASOSABS 0.0 0.0  --     Chemistries  Recent Labs  Lab 05/04/19 0927 05/04/19 1220 05/05/19 0016  NA 132* 134* 134*  K 3.7 4.1 3.7  CL 97* 98 100  CO2 25 24 23   GLUCOSE 133* 111* 104*  BUN 16 17 14   CREATININE 0.69 0.69 0.61  CALCIUM 9.0 8.8* 8.4*  AST 18 19 18   ALT 15 15 13   ALKPHOS 91 89 84  BILITOT 0.5 0.6 0.5   ------------------------------------------------------------------------------------------------------------------ No results for input(s): CHOL, HDL, LDLCALC, TRIG, CHOLHDL, LDLDIRECT in the last 72 hours.  Lab Results  Component Value Date   HGBA1C 6.4 (H) 12/30/2018   ------------------------------------------------------------------------------------------------------------------ No results for input(s): TSH, T4TOTAL, T3FREE, THYROIDAB in the last 72 hours.  Invalid input(s): FREET3  ------------------------------------------------------------------------------------------------------------------ No results for input(s): VITAMINB12, FOLATE, FERRITIN, TIBC, IRON, RETICCTPCT in the last 72 hours.  Coagulation profile No results for input(s): INR, PROTIME in the last 168 hours.  No results for input(s): DDIMER in the last 72 hours.  Cardiac Enzymes No results for input(s): CKMB, TROPONINI, MYOGLOBIN in the last 168 hours.  Invalid input(s): CK ------------------------------------------------------------------------------------------------------------------ No results found for: BNP   Roxan Hockey M.D on 05/05/2019 at 4:08 PM  Go to [email or web address] - for contact info  [facility] - Office  [phone]

## 2019-05-05 NOTE — Progress Notes (Signed)
Temp 100.6. Dr. Denton Brick notified per order. No distress. No new orders.

## 2019-05-05 NOTE — Progress Notes (Signed)
*  PRELIMINARY RESULTS* Echocardiogram 2D Echocardiogram has been performed.  Leavy Cella 05/05/2019, 2:06 PM

## 2019-05-06 LAB — URINE CULTURE
Culture: <10000 — AB
Special Requests: NORMAL

## 2019-05-06 LAB — GLUCOSE, CAPILLARY
Glucose-Capillary: 96 mg/dL (ref 70–99)
Glucose-Capillary: 113 mg/dL — ABNORMAL HIGH (ref 70–99)
Glucose-Capillary: 76 mg/dL (ref 70–99)
Glucose-Capillary: 104 mg/dL — ABNORMAL HIGH (ref 70–99)

## 2019-05-06 LAB — CBC
HCT: 25.3 % — ABNORMAL LOW (ref 36.0–46.0)
Hemoglobin: 7.6 g/dL — ABNORMAL LOW (ref 12.0–15.0)
MCH: 29.5 pg (ref 26.0–34.0)
MCHC: 30 g/dL (ref 30.0–36.0)
MCV: 98.1 fL (ref 80.0–100.0)
Platelets: 252 10*3/uL (ref 150–400)
RBC: 2.58 MIL/uL — ABNORMAL LOW (ref 3.87–5.11)
RDW: 17.5 % — ABNORMAL HIGH (ref 11.5–15.5)
WBC: 6 10*3/uL (ref 4.0–10.5)
nRBC: 0 % (ref 0.0–0.2)

## 2019-05-06 LAB — HIV ANTIBODY (ROUTINE TESTING W REFLEX): HIV Screen 4th Generation wRfx: NONREACTIVE

## 2019-05-06 LAB — HEPARIN LEVEL (UNFRACTIONATED): Heparin Unfractionated: 0.47 IU/mL (ref 0.30–0.70)

## 2019-05-06 MED ORDER — SODIUM CHLORIDE 0.9 % IV SOLN
1.0000 g | INTRAVENOUS | Status: DC
  Administered 2019-05-06 – 2019-05-08 (×3): 1 g via INTRAVENOUS
  Filled 2019-05-06 (×3): qty 10

## 2019-05-06 MED ORDER — SODIUM CHLORIDE 0.9 % IV SOLN
500.0000 mg | INTRAVENOUS | Status: DC
  Administered 2019-05-07 – 2019-05-08 (×3): 500 mg via INTRAVENOUS
  Filled 2019-05-06 (×3): qty 500

## 2019-05-06 NOTE — Progress Notes (Signed)
Pt states decrease in SOB today. VSS at present time: Temp 101.1, B/P 148/80, P-106, RR 20 even and unlabored, and O2 sats 98% on 3L Via N/C after cough/deep breath. Dr. Denton Brick notified with orders pending for blood cultures and IS. No distress or complaints.

## 2019-05-06 NOTE — Progress Notes (Signed)
SATURATION QUALIFICATIONS: (This note is used to comply with regulatory documentation for home oxygen)  Patient Saturations on Room Air at Rest = 71 %  Patient Saturations on 4 Liters of oxygen while Ambulating = 94 %  Please briefly explain why patient needs home oxygen:

## 2019-05-06 NOTE — Progress Notes (Signed)
[facility] for Heparin Indication: pulmonary embolus  Allergies  Allergen Reactions  . Other     No Meat- Patient is vegetarian    Patient Measurements: Height: 5\' 4"  (162.6 cm) Weight: 166 lb (75.3 kg) IBW/kg (Calculated) : 54.7 Heparin Dosing Weight: 70 kg   Vital Signs: Temp: 100.3 F (37.9 C) (09/12 0600) Temp Source: Oral (09/12 0600) BP: 143/67 (09/12 0600) Pulse Rate: 97 (09/12 0600)  Labs: Recent Labs    05/04/19 0927 05/04/19 1220 05/04/19 1556 05/05/19 0016 05/05/19 0852 05/06/19 0637  HGB 8.8* 8.2*  --  7.5*  --  7.6*  HCT 29.3* 27.3*  --  25.3*  --  25.3*  PLT 239 236  --  228  --  252  HEPARINUNFRC  --   --   --  0.41 0.45 0.47  CREATININE 0.69 0.69  --  0.61  --   --   TROPONINIHS  --  155* 122*  --   --   --     Estimated Creatinine Clearance: 73.3 mL/min (by C-G formula based on SCr of 0.61 mg/dL).    Assessment: Pharmacy consulted to dose heparin in patient with PE, confirmed by CT.  Patient was not on anticoagulation prior to admission.  9/12  AM update:   Heparin level therapeutic at 0.47 IU/mL Hb 7.5   (watch closely)  Goal of Therapy:  Heparin level 0.3-0.7 units/ml Monitor platelets by anticoagulation protocol: Yes   Plan:  Continue  heparin at 1100 units/hr Daily heparin level  Monitor CBC and sign/symptoms of bleeding.  Despina Pole, Pharm. D. Clinical Pharmacist 05/06/2019 10:53 AM

## 2019-05-06 NOTE — Progress Notes (Signed)
Patient Demographics:    Bridget Richardson, is a 63 y.o. female, DOB - 1956/02/28, [id number]  Admit date - 05/04/2019   Admitting Physician [name], MD  Outpatient Primary MD for the patient is [name], MD  LOS - 1   Chief Complaint  Patient presents with   Shortness of Breath        Subjective:    [name] today has no emesis,  No chest pain,   -Fevers and dyspnea persist--- O2 sats dropped to 71 % to 73% on room air at rest, back up to low 90s on 3 to 4 L of oxygen via nasal cannula -  Assessment  & Plan :    Principal Problem:   Acute pulmonary embolism (HCC) Active Problems:   Malignant neoplasm of female breast ([name])   HTN (hypertension)   Diabetes mellitus ([name])  Brief Summary  63 y.o. female with medical history significant for breast cancer, DM2, HTN, HLD, CAD  admitted on 19 2020 with dyspnea and fevers and found to have bilateral pulmonary embolism   A/p 1)Bil PE--patient bilateral pulmonary embolism, dyspnea persist , also continues to have significant dyspnea on exertion--- some dizziness -Continue IV heparin, plan to transition also DOAC when cardiorespiratory symptoms improve -Echo with preserved EF of 60 to 65% -Given underlying malignancy --- patient most likely need lifelong anticoagulation  2) acute hypoxic respiratory failure--secondary to #1 above, O2 sats dropped to 71 % to 73% on room air at rest, back up to low 90s on 3 to 4 L of oxygen via nasal cannula  3) possible pneumonia--given persistent fevers and persistent hypoxia--in the immunocompromised patient currently undergoing chemotherapy for breast cancer--repeat blood cultures obtained from Port-A-Cath and from peripheral draw--start empiric Rocephin and azithromycin for possible community-acquired pneumonia -Fevers may be secondary to inflammatory response from PE with pulmonary  infection -Patient may not have leukocytosis due to immunosuppression from current chemotherapy and underlying malignancy -Admission blood cultures NGTD  4)HTN--restart low-dose Coreg, hold enalapril due to recent contrast study  5)DM2--hold metformin due to recent contrast study, Use Novolog/Humalog Sliding scale insulin with Accu-Cheks/Fingersticks as ordered  6) acute on chronic anemia--- patient 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 for ongoing chemoRx    Disposition/Need for in-Hospital Stay- patient unable to be discharged at this time due to symptomatic pulmonary embolism with significant dyspnea requiring IV heparin infusion-- -O2 sats dropped to 71 % to 73% on room air at rest, back up to low 90s on 3 to 4 L of oxygen via nasal cannula  Code Status : Full  Family Communication:   NA (patient is alert, awake and coherent)  Disposition Plan  : TBD   Consults  :  NA  DVT Prophylaxis  :  Iv Heparin  Lab Results  Component Value Date   PLT 252 05/06/2019    Inpatient Medications  Scheduled Meds:  atorvastatin  40 mg Oral QHS  carvedilol  3.125 mg Oral BID WC   ferrous sulfate  325 mg Oral Q breakfast   insulin aspart  0-9 Units Subcutaneous TID WC   traZODone  50 mg Oral QHS   Continuous Infusions:  sodium chloride 50 mL/hr at 05/06/19 1155   azithromycin     cefTRIAXone (ROCEPHIN)  IV     heparin 1,100 Units/hr (05/06/19 1156)   PRN Meds:.acetaminophen **OR** acetaminophen, ondansetron    Anti-infectives (From admission, onward)   Start     Dose/Rate Route Frequency Ordered Stop    05/06/19 2000  cefTRIAXone (ROCEPHIN) 1 g in sodium chloride 0.9 % 100 mL IVPB     1 g 200 mL/hr over 30 Minutes Intravenous Every 24 hours 05/06/19 1947     05/06/19 2000  azithromycin (ZITHROMAX) 500 mg in sodium chloride 0.9 % 250 mL IVPB     500 mg 250 mL/hr over 60 Minutes Intravenous Every 24 hours 05/06/19 1947          Objective:   Vitals:   05/06/19 1808 05/06/19 1830 05/06/19 1936 05/06/19 1938  BP:      Pulse:    94  Resp:    16  Temp:   100.3 F (37.9 C)   TempSrc:   Oral   SpO2: (!) 73% 94%  96%  Weight:      Height:       Temp:  [100.1 F (37.8 C)-101.1 F (38.4 C)] 100.3 F (37.9 C) (09/12 1936) Pulse Rate:  [90-106] 94 (09/12 1938) Resp:  [16-20] 16 (09/12 1938) BP: (95-168)/(67-81) 148/80 (09/12 1615) SpO2:  [71 %-98 %] 96 % (09/12 1938)   Wt Readings from Last 3 Encounters:  05/04/19 75.3 kg  05/04/19 75.4 kg  04/27/19 79.2 kg     Intake/Output Summary (Last 24 hours) at 05/06/2019 1947 Last data filed at 05/06/2019 1800 Gross per 24 hour  Intake 1412.64 ml  Output --  Net 1412.64 ml     Physical Exam  Gen:- Awake Alert, dyspnea on minimal exertion HEENT:- [place].AT, No sclera icterus Neck-Supple Neck,No JVD,.  Nose- [place] 4L/min Lungs-dyspnea on exertion, diminished breath sounds bilaterally -Right subclavian Port-A-Cath in situ -CV- S1, S2 normal, regular, tachycardic, dizziness Abd-  +ve B.Sounds, Abd Soft, No tenderness,    Extremity/Skin:- No  edema, pedal pulses present  Psych-affect is appropriate, oriented x3 Neuro-no new focal deficits, no tremors   Data Review:   Micro Results Recent Results (from the past 240 hour(s))  Urine culture     Status: Abnormal   Collection Time: 05/04/19 11:25 AM   Specimen: Urine, Clean Catch  Result Value Ref Range Status   Specimen Description   Final    URINE, CLEAN CATCH Performed at [facility], [address], [place], [place] [address]    Special Requests   Final    Normal Performed at  [facility], [address] [place], [place] [address]    Culture (A)  Final    <10,000 COLONIES/mL INSIGNIFICANT GROWTH Performed at [facility], [place] [address], [place],  [address]    Report Status 05/06/2019 FINAL  Final  SARS Coronavirus 2 [facility] order, Performed in [facility] hospital lab) Nasopharyngeal Nasopharyngeal Swab     Status: None   Collection Time: 05/04/19 12:18 PM   Specimen: Nasopharyngeal Swab  Result Value Ref Range Status   SARS Coronavirus 2 NEGATIVE NEGATIVE Final    Comment: (NOTE) If result is NEGATIVE SARS-CoV-2 target nucleic acids are NOT  DETECTED. The SARS-CoV-2 RNA is generally detectable in upper and lower  respiratory specimens during the acute phase of infection. The lowest  concentration of SARS-CoV-2 viral copies this assay can detect is 250  copies / mL. A negative result does not preclude SARS-CoV-2 infection  and should not be used as the sole basis for treatment or other  patient management decisions.  A negative result may occur with  improper specimen collection / handling, submission of specimen other  than nasopharyngeal swab, presence of viral mutation(s) within the  areas targeted by this assay, and inadequate number of viral copies  (<250 copies / mL). A negative result must be combined with clinical  observations, patient history, and epidemiological information. If result is POSITIVE SARS-CoV-2 target nucleic acids are DETECTED. The SARS-CoV-2 RNA is generally detectable in upper and lower  respiratory specimens dur ing the acute phase of infection.  Positive  results are indicative of active infection with SARS-CoV-2.  Clinical  correlation with patient history and other diagnostic information is  necessary to determine patient infection status.  Positive results do  not rule out bacterial infection or co-infection with other viruses. If result is PRESUMPTIVE POSTIVE SARS-CoV-2 nucleic acids MAY BE PRESENT.   A  presumptive positive result was obtained on the submitted specimen  and confirmed on repeat testing.  While 2019 novel coronavirus  (SARS-CoV-2) nucleic acids may be present in the submitted sample  additional confirmatory testing may be necessary for epidemiological  and / or clinical management purposes  to differentiate between  SARS-CoV-2 and other Sarbecovirus currently known to infect humans.  If clinically indicated additional testing with an alternate test  methodology [phone]) is advised. The SARS-CoV-2 RNA is generally  detectable in upper and lower respiratory sp ecimens during the acute  phase of infection. The expected result is Negative. Fact Sheet for Patients:  StrictlyIdeas.no Fact Sheet for Healthcare Providers: BankingDealers.co.za This test is not yet approved or cleared by the [place] FDA and has been authorized for detection and/or diagnosis of SARS-CoV-2 by FDA under an Emergency Use Authorization (EUA).  This EUA will remain in effect (meaning this test can be used) for the duration of the COVID-19 declaration under Section 564(b)(1) of the Act, 21 U.S.C. section 360bbb-3(b)(1), unless the authorization is terminated or revoked sooner. Performed at [facility], [address], [place], [place] [address]   Culture, blood (routine x 2)     Status: None (Preliminary result)   Collection Time: 05/04/19 12:25 PM   Specimen: BLOOD  Result Value Ref Range Status   Specimen Description BLOOD RIGHT ANTECUBITAL  Final   Special Requests   Final    BOTTLES DRAWN AEROBIC AND ANAEROBIC Blood Culture adequate volume   Culture   Final    NO GROWTH 2 DAYS Performed at [facility], [address], [place], [place] [address]    Report Status PENDING  Incomplete  Culture, blood (Routine X 2) w Reflex to ID Panel     Status: None (Preliminary result)   Collection Time: 05/06/19  5:00 PM   Specimen: Right Antecubital; Blood   Result Value Ref Range Status   Specimen Description RIGHT ANTECUBITAL  Final   Special Requests   Final    BOTTLES DRAWN AEROBIC AND ANAEROBIC Blood Culture adequate volume Performed at [facility], [address], [place],  [address]    Culture PENDING  Incomplete   Report Status PENDING  Incomplete  Culture, blood (Routine X 2) w Reflex to  ID Panel     Status: None (Preliminary result)   Collection Time: 05/06/19  5:11 PM   Specimen: Porta Cath; Blood  Result Value Ref Range Status   Specimen Description PORTA CATH  Final   Special Requests   Final    BOTTLES DRAWN AEROBIC AND ANAEROBIC Blood Culture adequate volume Performed at [facility], [address], [place], [place] [address]    Culture PENDING  Incomplete   Report Status PENDING  Incomplete    Radiology Reports Ct Angio Chest Pe W And/or Wo Contrast  Result Date: 05/04/2019 CLINICAL DATA:  Shortness of breath, fever. History of left breast carcinoma. High pretest probability of pulmonary emboli. EXAM: CT ANGIOGRAPHY CHEST WITH CONTRAST TECHNIQUE: Multidetector CT imaging of the chest was performed using the standard protocol during bolus administration of intravenous contrast. Multiplanar CT image reconstructions and MIPs were obtained to evaluate the vascular anatomy. CONTRAST:  167mL OMNIPAQUE IOHEXOL 350 MG/ML SOLN COMPARISON:  None. FINDINGS: Cardiovascular: Heart size upper limits normal. No pericardial effusion. Right subclavian port catheter to the distal SVC. RV/LV ratio 1.2. Dilated central pulmonary arteries. There is a central embolus in the distal right pulmonary artery extending into the upper and lower lobe branches. At least 2 incompletely occlusive left lower lobe segmental emboli extending into subsegmental branches. Moderate coronary calcifications. Adequate contrast opacification of the thoracic aorta with no evidence of dissection, aneurysm, or stenosis. There is classic 3-vessel brachiocephalic arch  anatomy without proximal stenosis. Aortic Atherosclerosis (ICD10-170.0). Mediastinum/Nodes: Small hiatal hernia. No hilar or mediastinal adenopathy. Lungs/Pleura: No pleural effusion. No pneumothorax. Patchy airspace consolidation in the superior segment right lower lobe. Somewhat geographic ground-glass opacities in the upper lobes bilaterally. Upper Abdomen: 2.8 cm peripherally calcified stone in the non-distended gallbladder. Small hiatal hernia. No acute findings. Musculoskeletal: Skin thickening and subcutaneous inflammatory/edematous change in the left anterior chest wall suggesting prior radiation therapy. Anterior vertebral endplate spurring at multiple levels in the mid and lower thoracic spine. Bilateral shoulder DJD. No fracture or worrisome bone lesion. IMPRESSION: 1. POSITIVE for acute PE with CT evidence of right heart strain (RV/LV Ratio = 1.2) consistent with at least submassive (intermediate risk) PE. The presence of right heart strain has been associated with an increased risk of morbidity and mortality. Critical Value/emergent results were called by telephone at the time of interpretation on 05/04/2019 at 4:18 pm to provider Dr. Roderic Palau, who verbally acknowledged these results. 2. Aortic Atherosclerosis (ICD10-I70.0) and coronary artery disease. 3. Cholelithiasis. Electronically Signed   By: Lucrezia Europe M.D.   On: 05/04/2019 16:19   Dg Chest Portable 1 View  Result Date: 05/04/2019 CLINICAL DATA:  Fever and shortness of breath. Left breast cancer. EXAM: PORTABLE CHEST 1 VIEW COMPARISON:  01/30/2019 FINDINGS: Stable postmastectomy changes on the left with left axillary surgical clips. Borderline enlarged cardiac silhouette, magnified by poor inspiration and the portable AP technique. Clear lungs. Thoracic spine degenerative changes. IMPRESSION: No acute abnormality. Electronically Signed   By: Claudie Revering M.D.   On: 05/04/2019 12:12     CBC Recent Labs  Lab 05/04/19 0927 05/04/19 1220  05/05/19 0016 05/06/19 0637  WBC 8.9 8.9 6.7 6.0  HGB 8.8* 8.2* 7.5* 7.6*  HCT 29.3* 27.3* 25.3* 25.3*  PLT 239 236 228 252  MCV 96.4 97.5 96.6 98.1  MCH 28.9 29.3 28.6 29.5  MCHC 30.0 30.0 29.6* 30.0  RDW 17.9* 17.8* 18.1* 17.5*  LYMPHSABS 0.4* 0.7  --   --   MONOABS 0.8 0.7  --   --  EOSABS 0.3 0.2  --   --   BASOSABS 0.0 0.0  --   --     Chemistries  Recent Labs  Lab 05/04/19 0927 05/04/19 1220 05/05/19 0016  NA 132* 134* 134*  K 3.7 4.1 3.7  CL 97* 98 100  CO2 25 24 23   GLUCOSE 133* 111* 104*  BUN 16 17 14   CREATININE 0.69 0.69 0.61  CALCIUM 9.0 8.8* 8.4*  AST 18 19 18   ALT 15 15 13   ALKPHOS 91 89 84  BILITOT 0.5 0.6 0.5   ------------------------------------------------------------------------------------------------------------------ No results for input(s): CHOL, HDL, LDLCALC, TRIG, CHOLHDL, LDLDIRECT in the last 72 hours.  Lab Results  Component Value Date   HGBA1C 6.4 (H) 12/30/2018   ------------------------------------------------------------------------------------------------------------------ No results for input(s): TSH, T4TOTAL, T3FREE, THYROIDAB in the last 72 hours.  Invalid input(s): FREET3 ------------------------------------------------------------------------------------------------------------------ No results for input(s): VITAMINB12, FOLATE, FERRITIN, TIBC, IRON, RETICCTPCT in the last 72 hours.  Coagulation profile No results for input(s): INR, PROTIME in the last 168 hours.  No results for input(s): DDIMER in the last 72 hours.  Cardiac Enzymes No results for input(s): CKMB, TROPONINI, MYOGLOBIN in the last 168 hours.  Invalid input(s): CK ------------------------------------------------------------------------------------------------------------------ No results found for: BNP   Roxan Hockey M.D on 05/06/2019 at 7:47 PM  Go to [email or web address] - for contact info  [facility] - Office  [phone]

## 2019-05-06 NOTE — Progress Notes (Signed)
Patient has IS at home,  I requested family bring it to her before/by tomorrow afternoon  and we will review it use. I did review deep breathing and cough techniques with patient.  She was able to demonstrate her understanding of these with good effort and techniques.

## 2019-05-07 DIAGNOSIS — T451X5A Adverse effect of antineoplastic and immunosuppressive drugs, initial encounter: Secondary | ICD-10-CM

## 2019-05-07 DIAGNOSIS — D649 Anemia, unspecified: Secondary | ICD-10-CM | POA: Diagnosis present

## 2019-05-07 DIAGNOSIS — D6481 Anemia due to antineoplastic chemotherapy: Secondary | ICD-10-CM

## 2019-05-07 LAB — GLUCOSE, CAPILLARY
Glucose-Capillary: 86 mg/dL (ref 70–99)
Glucose-Capillary: 116 mg/dL — ABNORMAL HIGH (ref 70–99)
Glucose-Capillary: 95 mg/dL (ref 70–99)

## 2019-05-07 LAB — HEPARIN LEVEL (UNFRACTIONATED): Heparin Unfractionated: 0.47 IU/mL (ref 0.30–0.70)

## 2019-05-07 LAB — CBC
HCT: 23.8 % — ABNORMAL LOW (ref 36.0–46.0)
Hemoglobin: 6.9 g/dL — CL (ref 12.0–15.0)
MCH: 28.3 pg (ref 26.0–34.0)
MCHC: 29 g/dL — ABNORMAL LOW (ref 30.0–36.0)
MCV: 97.5 fL (ref 80.0–100.0)
Platelets: 267 10*3/uL (ref 150–400)
RBC: 2.44 MIL/uL — ABNORMAL LOW (ref 3.87–5.11)
RDW: 17.3 % — ABNORMAL HIGH (ref 11.5–15.5)
WBC: 5.7 10*3/uL (ref 4.0–10.5)
nRBC: 0 % (ref 0.0–0.2)

## 2019-05-07 LAB — PREPARE RBC (CROSSMATCH)

## 2019-05-07 LAB — OCCULT BLOOD X 1 CARD TO LAB, STOOL: Fecal Occult Bld: NEGATIVE

## 2019-05-07 MED ORDER — FUROSEMIDE 10 MG/ML IJ SOLN
20.0000 mg | Freq: Once | INTRAMUSCULAR | Status: AC
  Administered 2019-05-07: 20 mg via INTRAVENOUS
  Filled 2019-05-07: qty 2

## 2019-05-07 MED ORDER — MAGIC MOUTHWASH W/LIDOCAINE
10.0000 mL | Freq: Three times a day (TID) | ORAL | Status: DC | PRN
  Administered 2019-05-07: 10 mL via ORAL
  Filled 2019-05-07: qty 10

## 2019-05-07 MED ORDER — SODIUM CHLORIDE 0.9% IV SOLUTION
Freq: Once | INTRAVENOUS | Status: AC
  Administered 2019-05-07: 10:00:00 via INTRAVENOUS

## 2019-05-07 NOTE — Progress Notes (Signed)
[facility] [place] for Heparin Indication: pulmonary embolus  Allergies  Allergen Reactions  . Other     No Meat- Patient is vegetarian    Patient Measurements: Height: 5\' 4"  (162.6 cm) Weight: 166 lb (75.3 kg) IBW/kg (Calculated) : 54.7 Heparin Dosing Weight: 70 kg   Vital Signs: Temp: 100.2 F (37.9 C) (09/13 0504) Temp Source: Oral (09/13 0504) BP: 148/64 (09/13 0504) Pulse Rate: 97 (09/13 0504)  Labs: Recent Labs    05/04/19 1220 05/04/19 1556  05/05/19 0016 05/05/19 0852 05/06/19 0637 05/07/19 0619  HGB 8.2*  --   --  7.5*  --  7.6* 6.9*  HCT 27.3*  --   --  25.3*  --  25.3* 23.8*  PLT 236  --   --  228  --  252 267  HEPARINUNFRC  --   --    < > 0.41 0.45 0.47 0.47  CREATININE 0.69  --   --  0.61  --   --   --   TROPONINIHS 155* 122*  --   --   --   --   --    < > = values in this interval not displayed.    Estimated Creatinine Clearance: 73.3 mL/min (by C-G formula based on SCr of 0.61 mg/dL).    Assessment: Pharmacy consulted to dose heparin in patient with PE, confirmed by CT.  Patient was not on anticoagulation prior to admission.  9/13 AM update:   Heparin level remains therapeutic at 0.47 IU/mL Hb 6.9  (watch closely)  Goal of Therapy:  Heparin level 0.3-0.7 units/ml Monitor platelets by anticoagulation protocol: Yes   Plan:  Continue  heparin at 1100 units/hr Daily heparin level  Monitor CBC and sign/symptoms of bleeding.  Despina Pole, Pharm. D. Clinical Pharmacist 05/07/2019 10:20 AM

## 2019-05-07 NOTE — Progress Notes (Signed)
Patient Demographics:    Bridget Richardson, is a 63 y.o. female, DOB - July 05, 1956, [id number]  Admit date - 05/04/2019   Admitting Physician Costin Karlyne Greenspan, MD  Outpatient Primary MD for the patient is [name], MD  LOS - 2   Chief Complaint  Patient presents with  . Shortness of Breath        Subjective:    [employer] today has no emesis,  No chest pain,    -Fever curve is trending down--T-max 101.1, T-current 100.2 -Patient complains of dizziness and dyspnea on exertion--hemoglobin is down to 6.9 -Had brown stool  Assessment  & Plan :    Principal Problem:   Acute pulmonary embolism (HCC) Active Problems:   Acute on chronic SYMPTOMATIC anemia   Anemia associated with chemotherapy/Malignancy   Malignant neoplasm of upper-outer quadrant of left breast in female, estrogen receptor positive (HCC)   Malignant neoplasm of female breast (HCC)   HTN (hypertension)   Diabetes mellitus ([place])  Brief Summary  63 y.o. female with medical history significant for breast cancer, DM2, HTN, HLD, CAD  admitted on 19 2020 with dyspnea and fevers and found to have bilateral pulmonary embolism   A/p 1)Bil PE--patient bilateral pulmonary embolism, dyspnea persist , also has  significant dyspnea on exertion and hypoxia --- some dizziness -Continue IV heparin, plan to transition also DOAC when cardiorespiratory symptoms improve and if no obvious bleeding noted -Echo with preserved EF of 60 to 65% -Given underlying malignancy --- patient most likely need lifelong anticoagulation  2) acute hypoxic respiratory failure--multifactorial including pulmonary embolism, symptomatic anemia and possible pneumonia -hypoxia persist, continue 3 to 4 L of oxygen via nasal cannula  3) symptomatic acute on chronic anemia--- at baseline hemoglobin usually between 8 and 9 due to breast cancer with ongoing  chemotherapy treatments (myelosuppression)--hemoglobin down to 6.9 most likely due to PE and some hemodilution from IV fluids -Patient with dyspnea on exertion, hypoxia and dizziness -Transfuse 1 unit of PRBC for symptomatic anemia -Check stool occult blood  4)Possible Pneumonia--Fever curve is trending down--T-max 101.1, T-current 100.2 -hypoxia persist,- patient is immunocompromised patient currently undergoing chemotherapy for breast cancer--repeat blood cultures obtained from Port-A-Cath and from peripheral draw on 05/06/2019-- continue IV Rocephin and azithromycin for possible community-acquired pneumonia -Fevers may be secondary to inflammatory response from PE with pulmonary infection -Patient may not have leukocytosis due to immunosuppression from current chemotherapy and underlying malignancy -Admission blood cultures from 05/04/2019 NGTD -Repeat blood cultures from 05/06/2019 NGTD  5)HTN-continue Coreg, hold enalapril due to recent contrast study  5)DM2--hold metformin due to recent contrast study, Use Novolog/Humalog Sliding scale insulin with Accu-Cheks/Fingersticks as ordered  6)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 for ongoing chemoRx   Disposition/Need for in-Hospital Stay- patient unable to be discharged at this time due to symptomatic pulmonary embolism with significant dyspnea requiring IV heparin infusion-- -O- hypoxia, also H&H dropping requiring PRBc transfusions  Code Status : Full  Family Communication:   NA (patient is alert, awake and coherent)  Disposition Plan  : TBD   Consults  :  NA  DVT Prophylaxis  :  Iv Heparin  Lab Results  Component Value Date   PLT 252 05/06/2019  Inpatient Medications  Scheduled Meds: . atorvastatin  40 mg Oral QHS  . carvedilol  3.125 mg Oral BID WC  . ferrous sulfate  325 mg Oral Q breakfast  . insulin aspart  0-9 Units Subcutaneous TID WC  . traZODone  50 mg Oral QHS    Continuous Infusions: . sodium chloride 50 mL/hr at 05/06/19 1155  . azithromycin 500 mg (05/07/19 0005)  . cefTRIAXone (ROCEPHIN)  IV 1 g (05/06/19 2215)  . heparin 1,100 Units/hr (05/06/19 1156)   PRN Meds:.acetaminophen **OR** acetaminophen, ondansetron    Anti-infectives (From admission, onward)   Start     Dose/Rate Route Frequency Ordered Stop   05/06/19 2000  cefTRIAXone (ROCEPHIN) 1 g in sodium chloride 0.9 % 100 mL IVPB     1 g 200 mL/hr over 30 Minutes Intravenous Every 24 hours 05/06/19 1947     05/06/19 2000  azithromycin (ZITHROMAX) 500 mg in sodium chloride 0.9 % 250 mL IVPB     500 mg 250 mL/hr over 60 Minutes Intravenous Every 24 hours 05/06/19 1947          Objective:   Vitals:   05/06/19 1938 05/06/19 2112 05/07/19 0504 05/07/19 0600  BP:  122/63 (!) 148/64   Pulse: 94  97   Resp: 16 18 16    Temp:  99.8 F (37.7 C) 100.2 F (37.9 C)   TempSrc:  Oral Oral   SpO2: 96%  95% 94%  Weight:      Height:       Temp:  [99.8 F (37.7 C)-101.1 F (38.4 C)] 100.2 F (37.9 C) (09/13 0504) Pulse Rate:  [90-106] 97 (09/13 0504) Resp:  [16-20] 16 (09/13 0504) BP: (122-168)/(63-80) 148/64 (09/13 0504) SpO2:  [71 %-98 %] 94 % (09/13 0600)   Wt Readings from Last 3 Encounters:  05/04/19 75.3 kg  05/04/19 75.4 kg  04/27/19 79.2 kg     Intake/Output Summary (Last 24 hours) at 05/07/2019 0801 Last data filed at 05/07/2019 0648 Gross per 24 hour  Intake 2770.93 ml  Output -  Net 2770.93 ml     Physical Exam  Gen:- Awake Alert, dyspnea on  exertion HEENT:- [place].AT, No sclera icterus Neck-Supple Neck,No JVD,.  Nose- [place] 3L/min Lungs-dyspnea on exertion, diminished breath sounds bilaterally -Right subclavian Port-A-Cath in situ -CV- S1, S2 normal, regular,   dizziness Abd-  +ve B.Sounds, Abd Soft, No tenderness,    Extremity/Skin:- No  edema, pedal pulses present  Psych-affect is appropriate, oriented x3 Neuro-generalized weakness, no new focal deficits,  no tremors   Data Review:   Micro Results Recent Results (from the past 240 hour(s))  Urine culture     Status: Abnormal   Collection Time: 05/04/19 11:25 AM   Specimen: Urine, Clean Catch  Result Value Ref Range Status   Specimen Description   Final    URINE, CLEAN CATCH Performed at [facility], [address], [place], [place] [address]    Special Requests   Final    Normal Performed at [facility], [address], [place], [place] [address]    Culture (A)  Final    <10,000 COLONIES/mL INSIGNIFICANT GROWTH Performed at [facility], [place] [address], [place], [place] [address]    Report Status 05/06/2019 FINAL  Final  SARS Coronavirus 2 [facility] order, Performed in [facility] hospital lab) Nasopharyngeal Nasopharyngeal Swab     Status: None   Collection Time: 05/04/19 12:18 PM   Specimen: Nasopharyngeal Swab  Result Value Ref Range  Status   SARS Coronavirus 2 NEGATIVE NEGATIVE Final    Comment: (NOTE) If result is NEGATIVE SARS-CoV-2 target nucleic acids are NOT DETECTED. The SARS-CoV-2 RNA is generally detectable in upper and lower  respiratory specimens during the acute phase of infection. The lowest  concentration of SARS-CoV-2 viral copies this assay can detect is 250  copies / mL. A negative result does not preclude SARS-CoV-2 infection  and should not be used as the sole basis for treatment or other  patient management decisions.  A negative result may occur with  improper specimen collection / handling, submission of specimen other  than nasopharyngeal swab, presence of viral mutation(s) within the  areas targeted by this assay, and inadequate number of viral copies  (<250 copies / mL). A negative result must be combined with clinical  observations, patient history, and epidemiological information. If result is POSITIVE SARS-CoV-2 target nucleic acids are DETECTED. The SARS-CoV-2 RNA is generally detectable in upper and lower  respiratory specimens dur  ing the acute phase of infection.  Positive  results are indicative of active infection with SARS-CoV-2.  Clinical  correlation with patient history and other diagnostic information is  necessary to determine patient infection status.  Positive results do  not rule out bacterial infection or co-infection with other viruses. If result is PRESUMPTIVE POSTIVE SARS-CoV-2 nucleic acids MAY BE PRESENT.   A presumptive positive result was obtained on the submitted specimen  and confirmed on repeat testing.  While 2019 novel coronavirus  (SARS-CoV-2) nucleic acids may be present in the submitted sample  additional confirmatory testing may be necessary for epidemiological  and / or clinical management purposes  to differentiate between  SARS-CoV-2 and other Sarbecovirus currently known to infect humans.  If clinically indicated additional testing with an alternate test  methodology [phone]) is advised. The SARS-CoV-2 RNA is generally  detectable in upper and lower respiratory sp ecimens during the acute  phase of infection. The expected result is Negative. Fact Sheet for Patients:  StrictlyIdeas.no Fact Sheet for Healthcare Providers: BankingDealers.co.za This test is not yet approved or cleared by the [place] FDA and has been authorized for detection and/or diagnosis of SARS-CoV-2 by FDA under an Emergency Use Authorization (EUA).  This EUA will remain in effect (meaning this test can be used) for the duration of the COVID-19 declaration under Section 564(b)(1) of the Act, 21 U.S.C. section 360bbb-3(b)(1), unless the authorization is terminated or revoked sooner. Performed at [facility], [address], [place], [place] [address]   Culture, blood (routine x 2)     Status: None (Preliminary result)   Collection Time: 05/04/19 12:25 PM   Specimen: BLOOD  Result Value Ref Range Status   Specimen Description BLOOD RIGHT ANTECUBITAL  Final    Special Requests   Final    BOTTLES DRAWN AEROBIC AND ANAEROBIC Blood Culture adequate volume   Culture   Final    NO GROWTH 2 DAYS Performed at [facility], [address] [place], [place] [address]    Report Status PENDING  Incomplete  Culture, blood (Routine X 2) w Reflex to ID Panel     Status: None (Preliminary result)   Collection Time: 05/06/19  5:00 PM   Specimen: Right Antecubital; Blood  Result Value Ref Range Status   Specimen Description RIGHT ANTECUBITAL  Final   Special Requests   Final    BOTTLES DRAWN AEROBIC AND ANAEROBIC Blood Culture adequate volume Performed at [facility], [address] [place], [place]  [address]    Culture PENDING  Incomplete   Report Status PENDING  Incomplete  Culture, blood (Routine X 2) w Reflex to ID Panel     Status: None (Preliminary result)   Collection Time: 05/06/19  5:11 PM   Specimen: Porta Cath; Blood  Result Value Ref Range Status   Specimen Description PORTA CATH  Final   Special Requests   Final    BOTTLES DRAWN AEROBIC AND ANAEROBIC Blood Culture adequate volume Performed at [facility], [address], [place], [place] [address]    Culture PENDING  Incomplete   Report Status PENDING  Incomplete    Radiology Reports Ct Angio Chest Pe W And/or Wo Contrast  Result Date: 05/04/2019 CLINICAL DATA:  Shortness of breath, fever. History of left breast carcinoma. High pretest probability of pulmonary emboli. EXAM: CT ANGIOGRAPHY CHEST WITH CONTRAST TECHNIQUE: Multidetector CT imaging of the chest was performed using the standard protocol during bolus administration of intravenous contrast. Multiplanar CT image reconstructions and MIPs were obtained to evaluate the vascular anatomy. CONTRAST:  168mL OMNIPAQUE IOHEXOL 350 MG/ML SOLN COMPARISON:  None. FINDINGS: Cardiovascular: Heart size upper limits normal. No pericardial effusion. Right subclavian port catheter to the distal SVC. RV/LV ratio 1.2. Dilated central pulmonary  arteries. There is a central embolus in the distal right pulmonary artery extending into the upper and lower lobe branches. At least 2 incompletely occlusive left lower lobe segmental emboli extending into subsegmental branches. Moderate coronary calcifications. Adequate contrast opacification of the thoracic aorta with no evidence of dissection, aneurysm, or stenosis. There is classic 3-vessel brachiocephalic arch anatomy without proximal stenosis. Aortic Atherosclerosis (ICD10-170.0). Mediastinum/Nodes: Small hiatal hernia. No hilar or mediastinal adenopathy. Lungs/Pleura: No pleural effusion. No pneumothorax. Patchy airspace consolidation in the superior segment right lower lobe. Somewhat geographic ground-glass opacities in the upper lobes bilaterally. Upper Abdomen: 2.8 cm peripherally calcified stone in the non-distended gallbladder. Small hiatal hernia. No acute findings. Musculoskeletal: Skin thickening and subcutaneous inflammatory/edematous change in the left anterior chest wall suggesting prior radiation therapy. Anterior vertebral endplate spurring at multiple levels in the mid and lower thoracic spine. Bilateral shoulder DJD. No fracture or worrisome bone lesion. IMPRESSION: 1. POSITIVE for acute PE with CT evidence of right heart strain (RV/LV Ratio = 1.2) consistent with at least submassive (intermediate risk) PE. The presence of right heart strain has been associated with an increased risk of morbidity and mortality. Critical Value/emergent results were called by telephone at the time of interpretation on 05/04/2019 at 4:18 pm to provider Dr. Roderic Palau, who verbally acknowledged these results. 2. Aortic Atherosclerosis (ICD10-I70.0) and coronary artery disease. 3. Cholelithiasis. Electronically Signed   By: Lucrezia Europe M.D.   On: 05/04/2019 16:19   Dg Chest Portable 1 View  Result Date: 05/04/2019 CLINICAL DATA:  Fever and shortness of breath. Left breast cancer. EXAM: PORTABLE CHEST 1 VIEW  COMPARISON:  01/30/2019 FINDINGS: Stable postmastectomy changes on the left with left axillary surgical clips. Borderline enlarged cardiac silhouette, magnified by poor inspiration and the portable AP technique. Clear lungs. Thoracic spine degenerative changes. IMPRESSION: No acute abnormality. Electronically Signed   By: Claudie Revering M.D.   On: 05/04/2019 12:12     CBC Recent Labs  Lab 05/04/19 0927 05/04/19 1220 05/05/19 0016 05/06/19 0637  WBC 8.9 8.9 6.7 6.0  HGB 8.8* 8.2* 7.5* 7.6*  HCT 29.3* 27.3* 25.3* 25.3*  PLT 239 236 228 252  MCV 96.4 97.5 96.6 98.1  MCH 28.9 29.3 28.6 29.5  MCHC 30.0 30.0  29.6* 30.0  RDW 17.9* 17.8* 18.1* 17.5*  LYMPHSABS 0.4* 0.7  --   --   MONOABS 0.8 0.7  --   --   EOSABS 0.3 0.2  --   --   BASOSABS 0.0 0.0  --   --     Chemistries  Recent Labs  Lab 05/04/19 0927 05/04/19 1220 05/05/19 0016  NA 132* 134* 134*  K 3.7 4.1 3.7  CL 97* 98 100  CO2 25 24 23   GLUCOSE 133* 111* 104*  BUN 16 17 14   CREATININE 0.69 0.69 0.61  CALCIUM 9.0 8.8* 8.4*  AST 18 19 18   ALT 15 15 13   ALKPHOS 91 89 84  BILITOT 0.5 0.6 0.5   ------------------------------------------------------------------------------------------------------------------ No results for input(s): CHOL, HDL, LDLCALC, TRIG, CHOLHDL, LDLDIRECT in the last 72 hours.  Lab Results  Component Value Date   HGBA1C 6.4 (H) 12/30/2018   ------------------------------------------------------------------------------------------------------------------ No results for input(s): TSH, T4TOTAL, T3FREE, THYROIDAB in the last 72 hours.  Invalid input(s): FREET3 ------------------------------------------------------------------------------------------------------------------ No results for input(s): VITAMINB12, FOLATE, FERRITIN, TIBC, IRON, RETICCTPCT in the last 72 hours.  Coagulation profile No results for input(s): INR, PROTIME in the last 168 hours.  No results for input(s): DDIMER in the last  72 hours.  Cardiac Enzymes No results for input(s): CKMB, TROPONINI, MYOGLOBIN in the last 168 hours.  Invalid input(s): CK ------------------------------------------------------------------------------------------------------------------ No results found for: BNP   Roxan Hockey M.D on 05/07/2019 at 8:01 AM  Go to [email or web address] - for contact info  [facility] - Office  [phone]

## 2019-05-08 LAB — TYPE AND SCREEN
ABO/RH(D): B POS
Antibody Screen: NEGATIVE
Unit division: 0

## 2019-05-08 LAB — CBC
HCT: 26.1 % — ABNORMAL LOW (ref 36.0–46.0)
Hemoglobin: 7.9 g/dL — ABNORMAL LOW (ref 12.0–15.0)
MCH: 28.8 pg (ref 26.0–34.0)
MCHC: 30.3 g/dL (ref 30.0–36.0)
MCV: 95.3 fL (ref 80.0–100.0)
Platelets: 294 10*3/uL (ref 150–400)
RBC: 2.74 MIL/uL — ABNORMAL LOW (ref 3.87–5.11)
RDW: 17.4 % — ABNORMAL HIGH (ref 11.5–15.5)
WBC: 6.5 10*3/uL (ref 4.0–10.5)
nRBC: 0 % (ref 0.0–0.2)

## 2019-05-08 LAB — BPAM RBC
Blood Product Expiration Date: 202010092359
ISSUE DATE / TIME: 202009131137
Unit Type and Rh: 1700

## 2019-05-08 LAB — GLUCOSE, CAPILLARY
Glucose-Capillary: 77 mg/dL (ref 70–99)
Glucose-Capillary: 86 mg/dL (ref 70–99)
Glucose-Capillary: 89 mg/dL (ref 70–99)
Glucose-Capillary: 93 mg/dL (ref 70–99)

## 2019-05-08 LAB — HEPARIN LEVEL (UNFRACTIONATED): Heparin Unfractionated: 0.39 IU/mL (ref 0.30–0.70)

## 2019-05-08 MED ORDER — POTASSIUM CHLORIDE CRYS ER 20 MEQ PO TBCR
40.0000 meq | EXTENDED_RELEASE_TABLET | Freq: Once | ORAL | Status: AC
  Administered 2019-05-09: 40 meq via ORAL

## 2019-05-08 MED ORDER — MAGNESIUM SULFATE 2 GM/50ML IV SOLN
2.0000 g | Freq: Once | INTRAVENOUS | Status: DC
  Filled 2019-05-08: qty 50

## 2019-05-08 MED ORDER — APIXABAN 5 MG PO TABS
10.0000 mg | ORAL_TABLET | Freq: Two times a day (BID) | ORAL | Status: DC
  Administered 2019-05-08 – 2019-05-09 (×3): 10 mg via ORAL
  Filled 2019-05-08 (×3): qty 2

## 2019-05-08 MED ORDER — ALUM & MAG HYDROXIDE-SIMETH 200-200-20 MG/5ML PO SUSP
30.0000 mL | Freq: Once | ORAL | Status: AC
  Administered 2019-05-08: 30 mL via ORAL
  Filled 2019-05-08: qty 30

## 2019-05-08 MED ORDER — LIDOCAINE VISCOUS HCL 2 % MT SOLN
15.0000 mL | Freq: Once | OROMUCOSAL | Status: DC
  Filled 2019-05-08: qty 15

## 2019-05-08 MED ORDER — APIXABAN 5 MG PO TABS: 5.0000 mg | ORAL_TABLET | Freq: Two times a day (BID) | ORAL | Status: DC

## 2019-05-08 NOTE — Progress Notes (Signed)
SATURATION QUALIFICATIONS: (This note is used to comply with regulatory documentation for home oxygen)  Patient Saturations on Room Air at Rest = 86%  Patient Saturations on Room Air while Ambulating = 78%  Patient Saturations on 2 Liters of oxygen while Ambulating = 81 Ambulating on 6 L SPO2: 90

## 2019-05-08 NOTE — Progress Notes (Signed)
Patient Demographics:    Bridget Richardson, is a 63 y.o. female, DOB - 11/29/1955, [id number]  Admit date - 05/04/2019   Admitting Physician [name], MD  Outpatient Primary MD for the patient is [name], MD  LOS - 3   Chief Complaint  Patient presents with   Shortness of Breath        Subjective:    Bridget Richardson today has no emesis,  No chest pain,    Hypoxia remains severe, patient requires up to 6 L of oxygen with activity, -Very dyspneic with minimal exertion, and dizziness persist -No frank chest pains -patient had a run of 28 beats of V. Tach-  Assessment  & Plan :    Principal Problem:   Acute pulmonary embolism (HCC) Active Problems:   Acute on chronic SYMPTOMATIC anemia   Anemia associated with chemotherapy/Malignancy   Malignant neoplasm of upper-outer quadrant of left breast in female, estrogen receptor positive ([place])   Malignant neoplasm of female breast (HCC)   HTN (hypertension)   Diabetes mellitus ([place])  Brief Summary  63 y.o. female with medical history significant for breast cancer, DM2, HTN, HLD, CAD  admitted on 19 2020 with dyspnea and fevers and found to have bilateral pulmonary embolism, significant hypoxia and dyspnea on exertion as well as dizziness persist   A/p 1)Bil PE--patient bilateral pulmonary embolism, dyspnea persist , also has  significant dyspnea on exertion and significant hypoxia --- some dizziness -Okay to stop IV heparin, plan to transition to Eliquis  -Echo with preserved EF of 60 to 65% -Given underlying malignancy --- patient most likely need lifelong anticoagulation  2) acute hypoxic respiratory failure--multifactorial including pulmonary embolism, symptomatic anemia and possible pneumonia -hypoxia persist, continue 3 to 4 L of oxygen via nasal cannula at rest, with activity patient requires up to 6 L of oxygen via nasal  cannula  3)Symptomatic acute on Chronic Anemia--- at baseline hemoglobin usually between 8 and 9 due to breast cancer with ongoing chemotherapy treatments (myelosuppression)--hemoglobin up to 7.9 from 6.9 after transfusion of 1 unit of packed cells-worsening anemia most likely from most likely due to PE and some hemodilution from IV fluids -Patient with significant dyspnea on exertion, hypoxia and dizziness - stool occult blood is negative  4)Possible Pneumonia--Fever curve is trending down--T-max T-max 100.2, T-current 99.1 -Significant hypoxia persist,- patient is immunocompromised patient currently undergoing chemotherapy for breast cancer--repeat blood cultures obtained from Port-A-Cath and from peripheral draw on 05/06/2019-- continue IV Rocephin and azithromycin for possible community-acquired pneumonia -Fevers may be secondary to inflammatory response from PE with pulmonary infection -Patient may not have leukocytosis due to immunosuppression from current chemotherapy and underlying malignancy -Admission blood cultures from 05/04/2019 NGTD -Repeat blood cultures from 05/06/2019 NGTD  5)HTN-continue Coreg, hold enalapril due to recent contrast study  5)DM2--hold metformin due to recent contrast study, Use Novolog/Humalog Sliding scale insulin with Accu-Cheks/Fingersticks as ordered  6)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 for ongoing chemoRx  7) ventricular arrhythmias--- patient had a run of 28 beats of V. Tach--she was asymptomatic, empirically replace potassium and give IV magnesium... Patient has significant hypoxia in the setting of acute PE . continue to monitor on telemetry monitored unit recheck BMP in  a.m.   Disposition/Need for in-Hospital Stay- patient unable to be discharged at this time due to symptomatic pulmonary embolism with significant dyspnea and very significant hypoxia requiring high flow oxygen-also patient had a run of 28 beats  of V. Tach-requiring IV magnesium replacement -very high risk for decompensation, continue to monitor closely  Code Status : Full  Family Communication:   NA (patient is alert, awake and coherent)  Disposition Plan  : Home with home health and oxygen  Consults  :  NA  DVT Prophylaxis  : Eliquis  Lab Results  Component Value Date   PLT 294 05/08/2019    Inpatient Medications  Scheduled Meds:  alum & mag hydroxide-simeth  30 mL Oral Once   And   lidocaine  15 mL Oral Once   apixaban  10 mg Oral BID   Followed by   Derrill Memo ON 05/15/2019] apixaban  5 mg Oral BID   atorvastatin  40 mg Oral QHS   carvedilol  3.125 mg Oral BID WC   ferrous sulfate  325 mg Oral Q breakfast   insulin aspart  0-9 Units Subcutaneous TID WC   potassium chloride  40 mEq Oral Once   traZODone  50 mg Oral QHS   Continuous Infusions:  sodium chloride 10 mL/hr at 05/07/19 0938   azithromycin 500 mg (05/07/19 2147)   cefTRIAXone (ROCEPHIN)  IV 1 g (05/07/19 2144)   magnesium sulfate bolus IVPB     PRN Meds:.acetaminophen **OR** acetaminophen, magic mouthwash w/lidocaine, ondansetron    Anti-infectives (From admission, onward)   Start     Dose/Rate Route Frequency Ordered Stop   05/06/19 2000  cefTRIAXone (ROCEPHIN) 1 g in sodium chloride 0.9 % 100 mL IVPB     1 g 200 mL/hr over 30 Minutes Intravenous Every 24 hours 05/06/19 1947     05/06/19 2000  azithromycin (ZITHROMAX) 500 mg in sodium chloride 0.9 % 250 mL IVPB     500 mg 250 mL/hr over 60 Minutes Intravenous Every 24 hours 05/06/19 1947          Objective:   Vitals:   05/08/19 0811 05/08/19 0849 05/08/19 1447 05/08/19 1635  BP:  136/63 140/75 (!) 152/76  Pulse:  (!) 101 (!) 102 (!) 101  Resp:   20   Temp:   99.1 F (37.3 C)   TempSrc:      SpO2: 92% 96%    Weight:      Height:       Temp:  [99.1 F (37.3 C)] 99.1 F (37.3 C) (09/14 1447) Pulse Rate:  [88-102] 101 (09/14 1635) Resp:  [16-20] 20 (09/14 1447) BP:  (136-152)/(63-76) 152/76 (09/14 1635) SpO2:  [92 %-96 %] 96 % (09/14 0849)   Wt Readings from Last 3 Encounters:  05/04/19 75.3 kg  05/04/19 75.4 kg  04/27/19 79.2 kg     Intake/Output Summary (Last 24 hours) at 05/08/2019 1812 Last data filed at 05/08/2019 0300 Gross per 24 hour  Intake 707.43 ml  Output --  Net 707.43 ml     Physical Exam  Gen:- Awake Alert, dyspnea on  exertion HEENT:- [place].AT, No sclera icterus Neck-Supple Neck,No JVD,.  Nose- [place] 3L/min at rest, 6L/min with activity Lungs-dyspnea on exertion, diminished breath sounds bilaterally -Right subclavian Port-A-Cath in situ -CV- S1, S2 normal, regular,   dizziness Abd-  +ve B.Sounds, Abd Soft, No tenderness,    Extremity/Skin:- No  edema, pedal pulses present  Psych-affect is appropriate, oriented x3 Neuro-generalized weakness, no new  focal deficits, no tremors   Data Review:   Micro Results Recent Results (from the past 240 hour(s))  Urine culture     Status: Abnormal   Collection Time: 05/04/19 11:25 AM   Specimen: Urine, Clean Catch  Result Value Ref Range Status   Specimen Description   Final    URINE, CLEAN CATCH Performed at [facility], [address] [place], [place] [address]    Special Requests   Final    Normal Performed at [facility], [address], [place], [place] [address]    Culture (A)  Final    <10,000 COLONIES/mL INSIGNIFICANT GROWTH Performed at [facility] [address], [place], [place] [address]    Report Status 05/06/2019 FINAL  Final  SARS Coronavirus 2 [facility] order, Performed in [facility] hospital lab) Nasopharyngeal Nasopharyngeal Swab     Status: None   Collection Time: 05/04/19 12:18 PM   Specimen: Nasopharyngeal Swab  Result Value Ref Range Status   SARS Coronavirus 2 NEGATIVE NEGATIVE Final    Comment: (NOTE) If result is NEGATIVE SARS-CoV-2 target nucleic acids are NOT DETECTED. The SARS-CoV-2 RNA is generally detectable in upper and lower   respiratory specimens during the acute phase of infection. The lowest  concentration of SARS-CoV-2 viral copies this assay can detect is 250  copies / mL. A negative result does not preclude SARS-CoV-2 infection  and should not be used as the sole basis for treatment or other  patient management decisions.  A negative result may occur with  improper specimen collection / handling, submission of specimen other  than nasopharyngeal swab, presence of viral mutation(s) within the  areas targeted by this assay, and inadequate number of viral copies  (<250 copies / mL). A negative result must be combined with clinical  observations, patient history, and epidemiological information. If result is POSITIVE SARS-CoV-2 target nucleic acids are DETECTED. The SARS-CoV-2 RNA is generally detectable in upper and lower  respiratory specimens dur ing the acute phase of infection.  Positive  results are indicative of active infection with SARS-CoV-2.  Clinical  correlation with patient history and other diagnostic information is  necessary to determine patient infection status.  Positive results do  not rule out bacterial infection or co-infection with other viruses. If result is PRESUMPTIVE POSTIVE SARS-CoV-2 nucleic acids MAY BE PRESENT.   A presumptive positive result was obtained on the submitted specimen  and confirmed on repeat testing.  While 2019 novel coronavirus  (SARS-CoV-2) nucleic acids may be present in the submitted sample  additional confirmatory testing may be necessary for epidemiological  and / or clinical management purposes  to differentiate between  SARS-CoV-2 and other Sarbecovirus currently known to infect humans.  If clinically indicated additional testing with an alternate test  methodology [phone]) is advised. The SARS-CoV-2 RNA is generally  detectable in upper and lower respiratory sp ecimens during the acute  phase of infection. The expected result is Negative. Fact  Sheet for Patients:  StrictlyIdeas.no Fact Sheet for Healthcare Providers: BankingDealers.co.za This test is not yet approved or cleared by the [place] FDA and has been authorized for detection and/or diagnosis of SARS-CoV-2 by FDA under an Emergency Use Authorization (EUA).  This EUA will remain in effect (meaning this test can be used) for the duration of the COVID-19 declaration under Section 564(b)(1) of the Act, 21 U.S.C. section 360bbb-3(b)(1), unless the authorization is terminated or revoked sooner. Performed at [facility], [address] [place], [place] [address]  Culture, blood (routine x 2)     Status: None (Preliminary result)   Collection Time: 05/04/19 12:25 PM   Specimen: BLOOD  Result Value Ref Range Status   Specimen Description BLOOD RIGHT ANTECUBITAL  Final   Special Requests   Final    BOTTLES DRAWN AEROBIC AND ANAEROBIC Blood Culture adequate volume   Culture   Final    NO GROWTH 4 DAYS Performed at [facility], [address], [place], [place] [address]    Report Status PENDING  Incomplete  Culture, blood (Routine X 2) w Reflex to ID Panel     Status: None (Preliminary result)   Collection Time: 05/06/19  5:00 PM   Specimen: Right Antecubital; Blood  Result Value Ref Range Status   Specimen Description RIGHT ANTECUBITAL  Final   Special Requests   Final    BOTTLES DRAWN AEROBIC AND ANAEROBIC Blood Culture adequate volume   Culture   Final    NO GROWTH 2 DAYS Performed at [facility], [address] [place], [place] [address]    Report Status PENDING  Incomplete  Culture, blood (Routine X 2) w Reflex to ID Panel     Status: None (Preliminary result)   Collection Time: 05/06/19  5:11 PM   Specimen: Porta Cath; Blood  Result Value Ref Range Status   Specimen Description PORTA CATH  Final   Special Requests   Final    BOTTLES DRAWN AEROBIC AND ANAEROBIC Blood Culture adequate volume   Culture   Final     NO GROWTH 2 DAYS Performed at [facility], [address], [place], [place] [address]    Report Status PENDING  Incomplete    Radiology Reports Ct Angio Chest Pe W And/or Wo Contrast  Result Date: 05/04/2019 CLINICAL DATA:  Shortness of breath, fever. History of left breast carcinoma. High pretest probability of pulmonary emboli. EXAM: CT ANGIOGRAPHY CHEST WITH CONTRAST TECHNIQUE: Multidetector CT imaging of the chest was performed using the standard protocol during bolus administration of intravenous contrast. Multiplanar CT image reconstructions and MIPs were obtained to evaluate the vascular anatomy. CONTRAST:  153mL OMNIPAQUE IOHEXOL 350 MG/ML SOLN COMPARISON:  None. FINDINGS: Cardiovascular: Heart size upper limits normal. No pericardial effusion. Right subclavian port catheter to the distal SVC. RV/LV ratio 1.2. Dilated central pulmonary arteries. There is a central embolus in the distal right pulmonary artery extending into the upper and lower lobe branches. At least 2 incompletely occlusive left lower lobe segmental emboli extending into subsegmental branches. Moderate coronary calcifications. Adequate contrast opacification of the thoracic aorta with no evidence of dissection, aneurysm, or stenosis. There is classic 3-vessel brachiocephalic arch anatomy without proximal stenosis. Aortic Atherosclerosis (ICD10-170.0). Mediastinum/Nodes: Small hiatal hernia. No hilar or mediastinal adenopathy. Lungs/Pleura: No pleural effusion. No pneumothorax. Patchy airspace consolidation in the superior segment right lower lobe. Somewhat geographic ground-glass opacities in the upper lobes bilaterally. Upper Abdomen: 2.8 cm peripherally calcified stone in the non-distended gallbladder. Small hiatal hernia. No acute findings. Musculoskeletal: Skin thickening and subcutaneous inflammatory/edematous change in the left anterior chest wall suggesting prior radiation therapy. Anterior vertebral endplate spurring at  multiple levels in the mid and lower thoracic spine. Bilateral shoulder DJD. No fracture or worrisome bone lesion. IMPRESSION: 1. POSITIVE for acute PE with CT evidence of right heart strain (RV/LV Ratio = 1.2) consistent with at least submassive (intermediate risk) PE. The presence of right heart strain has been associated with an increased risk of morbidity and mortality. Critical Value/emergent results were called by  telephone at the time of interpretation on 05/04/2019 at 4:18 pm to provider Dr. Roderic Palau, who verbally acknowledged these results. 2. Aortic Atherosclerosis (ICD10-I70.0) and coronary artery disease. 3. Cholelithiasis. Electronically Signed   By: Lucrezia Europe M.D.   On: 05/04/2019 16:19   Dg Chest Portable 1 View  Result Date: 05/04/2019 CLINICAL DATA:  Fever and shortness of breath. Left breast cancer. EXAM: PORTABLE CHEST 1 VIEW COMPARISON:  01/30/2019 FINDINGS: Stable postmastectomy changes on the left with left axillary surgical clips. Borderline enlarged cardiac silhouette, magnified by poor inspiration and the portable AP technique. Clear lungs. Thoracic spine degenerative changes. IMPRESSION: No acute abnormality. Electronically Signed   By: Claudie Revering M.D.   On: 05/04/2019 12:12     CBC Recent Labs  Lab 05/04/19 0927 05/04/19 1220 05/05/19 0016 05/06/19 0637 05/07/19 0619 05/08/19 0419  WBC 8.9 8.9 6.7 6.0 5.7 6.5  HGB 8.8* 8.2* 7.5* 7.6* 6.9* 7.9*  HCT 29.3* 27.3* 25.3* 25.3* 23.8* 26.1*  PLT 239 236 228 252 267 294  MCV 96.4 97.5 96.6 98.1 97.5 95.3  MCH 28.9 29.3 28.6 29.5 28.3 28.8  MCHC 30.0 30.0 29.6* 30.0 29.0* 30.3  RDW 17.9* 17.8* 18.1* 17.5* 17.3* 17.4*  LYMPHSABS 0.4* 0.7  --   --   --   --   MONOABS 0.8 0.7  --   --   --   --   EOSABS 0.3 0.2  --   --   --   --   BASOSABS 0.0 0.0  --   --   --   --     Chemistries  Recent Labs  Lab 05/04/19 0927 05/04/19 1220 05/05/19 0016  NA 132* 134* 134*  K 3.7 4.1 3.7  CL 97* 98 100  CO2 25 24 23   GLUCOSE  133* 111* 104*  BUN 16 17 14   CREATININE 0.69 0.69 0.61  CALCIUM 9.0 8.8* 8.4*  AST 18 19 18   ALT 15 15 13   ALKPHOS 91 89 84  BILITOT 0.5 0.6 0.5   ------------------------------------------------------------------------------------------------------------------ No results for input(s): CHOL, HDL, LDLCALC, TRIG, CHOLHDL, LDLDIRECT in the last 72 hours.  Lab Results  Component Value Date   HGBA1C 6.4 (H) 12/30/2018   ------------------------------------------------------------------------------------------------------------------ No results for input(s): TSH, T4TOTAL, T3FREE, THYROIDAB in the last 72 hours.  Invalid input(s): FREET3 ------------------------------------------------------------------------------------------------------------------ No results for input(s): VITAMINB12, FOLATE, FERRITIN, TIBC, IRON, RETICCTPCT in the last 72 hours.  Coagulation profile No results for input(s): INR, PROTIME in the last 168 hours.  No results for input(s): DDIMER in the last 72 hours.  Cardiac Enzymes No results for input(s): CKMB, TROPONINI, MYOGLOBIN in the last 168 hours.  Invalid input(s): CK ------------------------------------------------------------------------------------------------------------------ No results found for: BNP   Roxan Hockey M.D on 05/08/2019 at 6:12 PM  Go to [email or web address] - for contact info  [facility] - Office  [phone]

## 2019-05-08 NOTE — Progress Notes (Addendum)
[place] for apixaban Indication: pulmonary embolus  Allergies  Allergen Reactions  . Other     No Meat- Patient is vegetarian    Patient Measurements: Height: 5\' 4"  (162.6 cm) Weight: 166 lb (75.3 kg) IBW/kg (Calculated) : 54.7 Heparin Dosing Weight: 70 kg   Vital Signs: Pulse Rate: 88 (09/13 1955)  Labs: Recent Labs    05/06/19 0637 05/07/19 0619 05/08/19 0419  HGB 7.6* 6.9* 7.9*  HCT 25.3* 23.8* 26.1*  PLT 252 267 294  HEPARINUNFRC 0.47 0.47 0.39    Estimated Creatinine Clearance: 73.3 mL/min (by C-G formula based on SCr of 0.61 mg/dL).    Assessment: Pharmacy consulted to transition from heparin to apixaban in patient with PE, confirmed by CT.  Patient was not on anticoagulation prior to admission.    Heparin level remains therapeutic at 0.39 IU/mL Hb 7.9  (watch closely)  Goal of Therapy:  Heparin level 0.3-0.7 units/ml Monitor platelets by anticoagulation protocol: Yes   Plan:  Stop heparin drip Start apixaban 10 mg BID x 7 days followed by apixaban 5 mg BID thereafter. Monitor CBC and sign/symptoms of bleeding.  Margot Ables, PharmD Clinical Pharmacist 05/08/2019 7:41 AM

## 2019-05-08 NOTE — TOC Initial Note (Signed)
Transition of Care [facility]) - Initial/Assessment Note    Patient Details  Name: Bridget Richardson MRN: [id number] Date of Birth: June 03, 1956  [facility] CM/SW Contact:    Boneta Lucks, RN Phone Number: 05/08/2019, 4:46 PM  Clinical Narrative:   Patient admitted acute Pulmonary Embolis. Discharge planning, MD placing Home 02 orders. Called multiple DME companies due to [place] resident, and acute diagnosis. Caryl Pina with [facility] accepted the referral.  RN to do walk study, need orders and they will delivery at tank tomorrow. TOC to follow.            Expected Discharge Plan: Home/Self Care     Patient Goals and CMS Choice Patient states their goals for this hospitalization and ongoing recovery are:: to go home. CMS Medicare.gov Compare Post Acute Care list provided to:: Patient Choice offered to / list presented to : Patient  Expected Discharge Plan and Services Expected Discharge Plan: Home/Self Care   Discharge Planning Services: Other - See comment(Home Oxygen)                     DME Arranged: Oxygen DME Agency: [facility] Date DME Agency Contacted: 05/08/19 Time DME Agency Contacted: 89 Representative spoke with at DME Agency: Caryl Pina            Prior Living Arrangements/Services   Lives with:: Self   Do you feel safe going back to the place where you live?: Yes            Criminal Activity/Legal Involvement Pertinent to Current Situation/Hospitalization: No - Comment as needed  Activities of Daily Living Home Assistive Devices/Equipment: None ADL Screening (condition at time of admission) Patient's cognitive ability adequate to safely complete daily activities?: Yes Is the patient deaf or have difficulty hearing?: No Does the patient have difficulty seeing, even when wearing glasses/contacts?: No Does the patient have difficulty concentrating, remembering, or making decisions?: No Patient able to express need for assistance with ADLs?: No Does  the patient have difficulty dressing or bathing?: No Independently performs ADLs?: Yes (appropriate for developmental age) Does the patient have difficulty walking or climbing stairs?: No Weakness of Legs: None Weakness of Arms/Hands: None      Affect (typically observed): Accepting   Alcohol / Substance Use: Not Applicable Psych Involvement: No (comment)  Admission diagnosis:  Acute septic pulmonary embolism with acute cor pulmonale ([place]) [I26.01] Patient Active Problem List   Diagnosis Date Noted  . Anemia associated with chemotherapy/Malignancy 05/07/2019  . Acute on chronic SYMPTOMATIC anemia 05/07/2019  . HTN (hypertension) 05/05/2019  . Diabetes mellitus ([place]) 05/05/2019  . Acute pulmonary embolism ([place]) 05/04/2019  . Malignant neoplasm of female breast ([place]) 01/04/2019  . Malignant neoplasm of upper-outer quadrant of left breast in female, estrogen receptor positive ([place])    PCP:  [name], MD Pharmacy:   [id number] - [place], [place] [address] Phone: [phone] Fax: [phone]         Readmission Risk Interventions No flowsheet data found.

## 2019-05-08 NOTE — Progress Notes (Signed)
Patient requires 2 liters nasal cannula while at rest and 6 liters with ambulation. Patient dropped to 81% at rest on room air.   Deirdre Pippins, RN

## 2019-05-09 ENCOUNTER — Inpatient Hospital Stay (HOSPITAL_COMMUNITY): Payer: PRIVATE HEALTH INSURANCE

## 2019-05-09 LAB — BASIC METABOLIC PANEL
Anion gap: 8 (ref 5–15)
BUN: 6 mg/dL — ABNORMAL LOW (ref 8–23)
CO2: 28 mmol/L (ref 22–32)
Calcium: 8.9 mg/dL (ref 8.9–10.3)
Chloride: 104 mmol/L (ref 98–111)
Creatinine, Ser: 0.5 mg/dL (ref 0.44–1.00)
GFR calc Af Amer: >60 mL/min (ref >60)
GFR calc non Af Amer: >60 mL/min (ref >60)
Glucose, Bld: 93 mg/dL (ref 70–99)
Potassium: 3 mmol/L — ABNORMAL LOW (ref 3.5–5.1)
Sodium: 140 mmol/L (ref 135–145)

## 2019-05-09 LAB — CBC
HCT: 26.2 % — ABNORMAL LOW (ref 36.0–46.0)
Hemoglobin: 7.9 g/dL — ABNORMAL LOW (ref 12.0–15.0)
MCH: 28.6 pg (ref 26.0–34.0)
MCHC: 30.2 g/dL (ref 30.0–36.0)
MCV: 94.9 fL (ref 80.0–100.0)
Platelets: 308 10*3/uL (ref 150–400)
RBC: 2.76 MIL/uL — ABNORMAL LOW (ref 3.87–5.11)
RDW: 17.2 % — ABNORMAL HIGH (ref 11.5–15.5)
WBC: 5.6 10*3/uL (ref 4.0–10.5)
nRBC: 0 % (ref 0.0–0.2)

## 2019-05-09 LAB — CULTURE, BLOOD (ROUTINE X 2)
Culture: NO GROWTH
Special Requests: ADEQUATE

## 2019-05-09 LAB — GLUCOSE, CAPILLARY
Glucose-Capillary: 99 mg/dL (ref 70–99)
Glucose-Capillary: 108 mg/dL — ABNORMAL HIGH (ref 70–99)
Glucose-Capillary: 105 mg/dL — ABNORMAL HIGH (ref 70–99)

## 2019-05-09 LAB — MAGNESIUM: Magnesium: 1.9 mg/dL (ref 1.7–2.4)

## 2019-05-09 MED ORDER — APIXABAN 5 MG PO TABS: 5.0000 mg | ORAL_TABLET | Freq: Two times a day (BID) | ORAL | 5 refills | Status: DC

## 2019-05-09 MED ORDER — CARVEDILOL 6.25 MG PO TABS: 6.2500 mg | ORAL_TABLET | Freq: Two times a day (BID) | ORAL | 5 refills | Status: AC

## 2019-05-09 MED ORDER — PREDNISONE 20 MG PO TABS: 20.0000 mg | ORAL_TABLET | Freq: Every day | ORAL | 0 refills | Status: DC

## 2019-05-09 MED ORDER — DOXYCYCLINE HYCLATE 100 MG PO TABS: 100.0000 mg | ORAL_TABLET | Freq: Two times a day (BID) | ORAL | 0 refills | Status: AC

## 2019-05-09 MED ORDER — ELIQUIS 5 MG VTE STARTER PACK: ORAL_TABLET | ORAL | 0 refills | Status: DC

## 2019-05-09 MED ORDER — PREDNISONE 20 MG PO TABS: 50.0000 mg | ORAL_TABLET | Freq: Once | ORAL | Status: DC

## 2019-05-09 MED ORDER — PREDNISONE 20 MG PO TABS
50.0000 mg | ORAL_TABLET | Freq: Once | ORAL | Status: AC
  Administered 2019-05-09: 50 mg via ORAL
  Filled 2019-05-09: qty 3

## 2019-05-09 MED ORDER — ACETAMINOPHEN 325 MG PO TABS: 650.0000 mg | ORAL_TABLET | Freq: Four times a day (QID) | ORAL | 0 refills | Status: AC | PRN

## 2019-05-09 MED ORDER — POTASSIUM CHLORIDE CRYS ER 20 MEQ PO TBCR
40.0000 meq | EXTENDED_RELEASE_TABLET | ORAL | Status: AC
  Administered 2019-05-09: 40 meq via ORAL
  Filled 2019-05-09 (×2): qty 2

## 2019-05-09 MED ORDER — GUAIFENESIN ER 600 MG PO TB12: 600.0000 mg | ORAL_TABLET | Freq: Two times a day (BID) | ORAL | 0 refills | Status: AC

## 2019-05-09 MED ORDER — PANTOPRAZOLE SODIUM 40 MG PO TBEC: 40.0000 mg | DELAYED_RELEASE_TABLET | Freq: Every day | ORAL | 1 refills | Status: AC

## 2019-05-09 MED ORDER — HEPARIN SOD (PORK) LOCK FLUSH 100 UNIT/ML IV SOLN
500.0000 [IU] | Freq: Once | INTRAVENOUS | Status: AC
  Administered 2019-05-09: 500 [IU] via INTRAVENOUS
  Filled 2019-05-09: qty 5

## 2019-05-09 NOTE — Progress Notes (Signed)
DME choices   Supplier Results List Supplier InformationSort Ascending Distance Sort Descending Contextual Help [place] [address] [phone]  Map and [employer] [place] [employer] - Opens in a new window 0.33 [place]. This means the supplier always accepts assignment for the category, which means they accept the Medicare-approved amount as payment in full for all claims [place] [address] [phone]  Map and [place] in a new window 0.48 [place]. This means the supplier always accepts assignment for the category, which means they accept the Medicare-approved amount as payment in full for all claims for the [place] [address] [phone]

## 2019-05-09 NOTE — Discharge Summary (Signed)
Bridget Richardson, is a 63 y.o. female  DOB 1956/05/17  MRN [id number].  Admission date:  05/04/2019  Admitting Physician  Costin Karlyne Greenspan, MD  Discharge Date:  05/09/2019   Primary MD  [name], MD  Recommendations for primary care physician for things to follow:   - 1)You are taking apixaban/Eliquis which is a blood thinner so Avoid ibuprofen/Advil/Aleve/Motrin/Goody Powders/Naproxen/BC powders/Meloxicam/Diclofenac/Indomethacin and other Nonsteroidal anti-inflammatory medications as these will make you more likely to bleed and can cause stomach ulcers, can also cause Kidney problems.   2) please call if blood in your stool or bloody urine or dark stools nosebleeds or any other concerns about bleeding while taking apixaban/Eliquis  3) you will need to be on apixaban/Eliquis blood thinner for at least 6 months most likely for the rest of your life  4) please follow-up with your primary care physician in 1 to 2 weeks for repeat CBC/complete blood count blood test and for reevaluation  5) please use oxygen as prescribed--- you may get over-the-counter pulse oximeter to keep monitoring your oxygen levels  6) please start Eliquis/apixaban blood thinner as prescribed--you initially take the starter pack with 10 mg tablets twice a day for 1 week , then 5 mg tablets twice a day after that . start the second prescription around 06/06/2019 after you complete the initial starter pack of Eliquis/Apixaban  Admission Diagnosis  Acute septic pulmonary embolism with acute cor pulmonale ([place]) [I26.01]   Discharge Diagnosis  Acute septic pulmonary embolism with acute cor pulmonale ([place]) [I26.01]   Principal Problem:   Acute pulmonary embolism ([place]) Active Problems:   Acute on chronic SYMPTOMATIC anemia   Anemia associated with chemotherapy/Malignancy   Malignant neoplasm of upper-outer quadrant of left breast in  female, estrogen receptor positive ([place])   Malignant neoplasm of female breast ([place])   HTN (hypertension)   Diabetes mellitus ([place])      Past Medical History:  Diagnosis Date   Cancer ([place])    stage 2 total mastctomy left breast dx march 2020   Coronary artery disease    Diabetes mellitus without complication ([place])    Hypertension     Past Surgical History:  Procedure Laterality Date   ANKLE SURGERY Right 2009   MASTECTOMY MODIFIED RADICAL Left 01/04/2019   Procedure: MASTECTOMY MODIFIED RADICAL;  Surgeon: Aviva Signs, MD;  Location: [facility];  Service: General;  Laterality: Left;   PORTACATH PLACEMENT Right 01/30/2019   Procedure: INSERTION PORT-A-CATH;  Surgeon: Aviva Signs, MD;  Location: [facility];  Service: General;  Laterality: Right;   WRIST SURGERY Right 1997     HPI  from the history and physical done on the day of admission:    - Chief Complaint: shortness of breath   HPI: Bridget Richardson is a 63 y.o. female with medical history significant of breast cancer, DM2, HTN, HLD, CAD who presents to the hospital with chief complaint of shortness of breath for the past 2 days.  Patient tells me that she was in her normal state of health up  until couple of days ago when she noticed that with ambulation, she is getting out of breath.  This has progressed to the point that today she decided to come to the emergency room.  Prior to coming to the ED she was seen in the cancer center for her scheduled chemotherapy infusion however they noted her to be hypoxic and sent her to the ED.  She did not get the treatment.  She denies any chest pain, denies any palpitations.  She denies any abdominal pain, no nausea or vomiting.  She denies any lightheadedness or dizziness.  This is never happened to her before.  She denies any history of bleeding, no blood in the stool or the urine  ED Course: In the ED she has a low-grade temp of 100, heart rate is in the 90s and on my evaluation she is  normotensive with blood pressure into the 110-120s.  She is satting 98% on 2 L. Blood work  Reveals hemoglobin of 8.2, CBC normal.  High-sensitivity troponin was 155 >>122.  Lactic acid is normal at 1.5.  CT angiogram done in the ED showed acute PE with CT evidence of right heart strain.  She was given heparin and we are asked to admit.    Hospital Course:    - Brief Summary 63 y.o.femalewith medical history significant forbreast cancer, DM2, HTN, HLD, CAD admitted on 19 2020 with dyspnea and fevers and found to have bilateral pulmonary embolism,  hypoxia persist, dyspnea on exertion improving  A/p 1)Bil PE--patient bilateral pulmonary embolism, -clinically improving, less dyspneic, hypoxia persist, -Initially treated with IV heparin, transitioned to p.o. Eliquis -Echo with preserved EF of 60 to 65% -Patient's pulmonary embolism was "unprovoked" and given underlying malignancy (breast cancer) --- patient most likely need lifelong anticoagulation  2) acute hypoxic respiratory failure--multifactorial including pulmonary embolism, symptomatic anemia  -hypoxia improved but not resolved, continue 3 to 4 L of oxygen via nasal cannula at rest, with activity patient requires up to 6 L of oxygen via nasal cannula  3)Symptomatic acute on Chronic Anemia--- at baseline hemoglobin usually between 8 and 9 due to breast cancer with ongoing chemotherapy treatments (myelosuppression)--hemoglobin up to 7.9 from 6.9 after transfusion of 1 unit of packed cells-worsening anemia most likely from most likely due to PE and some hemodilution from IV fluids -- stool occult blood is negative  4)Possible Pneumonia--fevers have resolved, - -hypoxia persist,- patient is immunocompromised patient currently undergoing chemotherapy for breast cancer--repeat blood cultures obtained from Port-A-Cath and from peripheral draw on 05/06/2019-- Treated with IV Rocephin and azithromycin for possible community-acquired  pneumonia,  repeat chest x-ray on 05/09/2019 with bronchitis but no definite pneumonia -Discharge on doxycycline -Fevers may be secondary to inflammatory response from PE with pulmonary infection -Patient may not have leukocytosis due to immunosuppression from current chemotherapy and underlying malignancy -Admission blood cultures from 05/04/2019 NGTD -Repeat blood cultures from 05/06/2019 NGTD  5)HTN-continue Coreg, okay to restart enalapril  5)DM2--okay to restart metformin  6)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 for ongoing chemoRx  7)Ventricular arrhythmias--- on 05/08/2019 patient had a run of 28 beats of V. Tach--she was asymptomatic, empirically replace potassium and give IV magnesium... Patient has significant hypoxia in the setting of acute PE .  -Patient received potassium and magnesium replacements no further ventricular arrhythmias  Code Status : Full  Family Communication:   NA (patient is alert, awake and coherent)  Disposition Plan  : Home with home oxygen  Consults  :  NA  Discharge Condition: stable  Follow UP  [place] Follow up.   Why: Home Oxygen Contact information: [place] [address] [phone]           Diet and Activity recommendation:  As advised  Discharge Instructions    Discharge Instructions    Call MD for:  difficulty breathing, headache or visual disturbances   Complete by: As directed    Call MD for:  persistant dizziness or light-headedness   Complete by: As directed    Call MD for:  persistant nausea and vomiting   Complete by: As directed    Call MD for:  severe uncontrolled pain   Complete by: As directed    Call MD for:  temperature >100.4   Complete by: As directed    Diet - low sodium heart healthy   Complete by: As directed    Discharge instructions   Complete by: As directed    1) you are taking apixaban/Eliquis  which is a blood thinner so Avoid ibuprofen/Advil/Aleve/Motrin/Goody Powders/Naproxen/BC powders/Meloxicam/Diclofenac/Indomethacin and other Nonsteroidal anti-inflammatory medications as these will make you more likely to bleed and can cause stomach ulcers, can also cause Kidney problems.   2) please call if blood in your stool or bloody urine or dark stools nosebleeds or any other concerns about bleeding while taking apixaban/Eliquis  3) you will need to be on apixaban/Eliquis blood thinner for at least 6 months most likely for the rest of your life  4) please follow-up with your primary care physician in 1 to 2 weeks for repeat CBC/complete blood count blood test and for reevaluation  5) please use oxygen as prescribed--- you may get over-the-counter pulse oximeter to keep monitoring your oxygen levels  6) please start Eliquis/apixaban blood thinner as prescribed--you initially take the starter pack with 10 mg tablets twice a day for 1 week , then 5 mg tablets twice a day after that . start the second prescription around 06/06/2019 after you complete the initial starter pack of Eliquis/Apixaban   Increase activity slowly   Complete by: As directed         Discharge Medications     Allergies as of 05/09/2019      Reactions   Other    No Meat- Patient is vegetarian      Medication List    STOP taking these medications   dexamethasone 4 MG tablet Commonly known as: DECADRON   prochlorperazine 10 MG tablet Commonly known as: COMPAZINE     TAKE these medications   acetaminophen 325 MG tablet Commonly known as: TYLENOL Take 2 tablets (650 mg total) by mouth every 6 (six) hours as needed for mild pain (or Fever >/= 101).   atorvastatin 40 MG tablet Commonly known as: LIPITOR Take 40 mg by mouth at bedtime.   carvedilol 6.25 MG tablet Commonly known as: COREG Take 1 tablet (6.25 mg total) by mouth 2 (two) times daily with a meal. What changed:   when to take  this  additional instructions   CYCLOPHOSPHAMIDE IV Inject into the vein every 14 (fourteen) days.   doxycycline 100 MG tablet Commonly known as: VIBRA-TABS Take 1 tablet (100 mg total) by mouth 2 (two) times daily for 5 days. For Bronchitis   Eliquis DVT/PE Starter Pack 5 MG Tabs Take as directed on package: start with two-5mg  tablets twice daily for 7 days. On day 8, switch to one-5mg  tablet twice daily.  apixaban 5 MG Tabs tablet Commonly known as: Eliquis Take 1 tablet (5 mg total) by mouth 2 (two) times daily. Start this prescription around 06/06/2019 after you complete the initial starter pack of Eliquis/Apixaban   enalapril 20 MG tablet Commonly known as: VASOTEC Take 20 mg by mouth 2 (two) times daily.   ferrous sulfate 325 (65 FE) MG tablet Take 325 mg by mouth daily with breakfast.   guaiFENesin 600 MG 12 hr tablet Commonly known as: Mucinex Take 1 tablet (600 mg total) by mouth 2 (two) times daily for 10 days.   lidocaine-prilocaine cream Commonly known as: EMLA Apply small amount to port a cath site and cover with plastic wrap 1 hour prior to chemotherapy appointments   metFORMIN 850 MG tablet Commonly known as: GLUCOPHAGE Take 850 mg by mouth daily with breakfast.   ondansetron 4 MG tablet Commonly known as: ZOFRAN Take 4 mg by mouth every 8 (eight) hours as needed for nausea or vomiting.   PACLITAXEL IV Inject into the vein once a week.   pantoprazole 40 MG tablet Commonly known as: Protonix Take 1 tablet (40 mg total) by mouth daily.   predniSONE 20 MG tablet Commonly known as: Deltasone Take 1 tablet (20 mg total) by mouth daily with breakfast. For Bronchitis   tiZANidine 4 MG tablet Commonly known as: ZANAFLEX TAKE 1 TABLET BY MOUTH THREE TIMES A DAY AS NEEDED FOR FLANK PAIN   traZODone 100 MG tablet Commonly known as: DESYREL Take 1 tablet (100 mg total) by mouth at bedtime as needed for sleep. What changed: how much to take             Durable Medical Equipment  (From admission, onward)         Start     Ordered   05/08/19 1647  For home use only DME oxygen  Once    Comments: SATURATION QUALIFICATIONS: (Thisnote is usedto comply with regulatory documentation for home oxygen)  Patient Saturations on Room Air at Rest =86 %  Patient Saturations on Room Air while Ambulating =81%  Patient Saturations on6Liters of oxygen while Ambulating = 93%  Patient needs continuous O2 at 2 L/min continuously via nasal cannula with humidifier and rest and up to 6L/min while ambulating, with gaseous portability and conserving device  Question Answer Comment  Length of Need Lifetime   Mode or (Route) Nasal cannula   Liters per Minute 6   Frequency Continuous (stationary and portable oxygen unit needed)   Oxygen conserving device Yes   Oxygen delivery system Gas      05/08/19 1649          Major procedures and Radiology Reports - PLEASE review detailed and final reports for all details, in brief -   Dg Chest 2 View  Result Date: 05/09/2019 CLINICAL DATA:  63 year old female with history of shortness of breath. EXAM: CHEST - 2 VIEW COMPARISON:  Chest x-ray 05/04/2019. FINDINGS: Lung volumes are normal. No consolidative airspace disease. Diffuse peribronchial cuffing and widespread interstitial prominence in the lungs bilaterally. No pleural effusions. No suspicious appearing pulmonary nodules or masses. No pneumothorax. Heart size is normal. Upper mediastinal contours are within normal limits. Right subclavian Port-A-Cath with tip terminating in the mid superior vena cava. Surgical clips in the left axillary region, presumably from prior lymph node dissection. IMPRESSION: 1. Diffuse peribronchial cuffing and interstitial prominence, concerning for an acute bronchitis. Electronically Signed   By: Vinnie Langton M.D.   On: 05/09/2019 11:10  Ct Angio Chest Pe W And/or Wo Contrast  Result Date: 05/04/2019 CLINICAL  DATA:  Shortness of breath, fever. History of left breast carcinoma. High pretest probability of pulmonary emboli. EXAM: CT ANGIOGRAPHY CHEST WITH CONTRAST TECHNIQUE: Multidetector CT imaging of the chest was performed using the standard protocol during bolus administration of intravenous contrast. Multiplanar CT image reconstructions and MIPs were obtained to evaluate the vascular anatomy. CONTRAST:  173mL OMNIPAQUE IOHEXOL 350 MG/ML SOLN COMPARISON:  None. FINDINGS: Cardiovascular: Heart size upper limits normal. No pericardial effusion. Right subclavian port catheter to the distal SVC. RV/LV ratio 1.2. Dilated central pulmonary arteries. There is a central embolus in the distal right pulmonary artery extending into the upper and lower lobe branches. At least 2 incompletely occlusive left lower lobe segmental emboli extending into subsegmental branches. Moderate coronary calcifications. Adequate contrast opacification of the thoracic aorta with no evidence of dissection, aneurysm, or stenosis. There is classic 3-vessel brachiocephalic arch anatomy without proximal stenosis. Aortic Atherosclerosis (ICD10-170.0). Mediastinum/Nodes: Small hiatal hernia. No hilar or mediastinal adenopathy. Lungs/Pleura: No pleural effusion. No pneumothorax. Patchy airspace consolidation in the superior segment right lower lobe. Somewhat geographic ground-glass opacities in the upper lobes bilaterally. Upper Abdomen: 2.8 cm peripherally calcified stone in the non-distended gallbladder. Small hiatal hernia. No acute findings. Musculoskeletal: Skin thickening and subcutaneous inflammatory/edematous change in the left anterior chest wall suggesting prior radiation therapy. Anterior vertebral endplate spurring at multiple levels in the mid and lower thoracic spine. Bilateral shoulder DJD. No fracture or worrisome bone lesion. IMPRESSION: 1. POSITIVE for acute PE with CT evidence of right heart strain (RV/LV Ratio = 1.2) consistent with at  least submassive (intermediate risk) PE. The presence of right heart strain has been associated with an increased risk of morbidity and mortality. Critical Value/emergent results were called by telephone at the time of interpretation on 05/04/2019 at 4:18 pm to provider Dr. Roderic Palau, who verbally acknowledged these results. 2. Aortic Atherosclerosis (ICD10-I70.0) and coronary artery disease. 3. Cholelithiasis. Electronically Signed   By: Lucrezia Europe M.D.   On: 05/04/2019 16:19   Dg Chest Portable 1 View  Result Date: 05/04/2019 CLINICAL DATA:  Fever and shortness of breath. Left breast cancer. EXAM: PORTABLE CHEST 1 VIEW COMPARISON:  01/30/2019 FINDINGS: Stable postmastectomy changes on the left with left axillary surgical clips. Borderline enlarged cardiac silhouette, magnified by poor inspiration and the portable AP technique. Clear lungs. Thoracic spine degenerative changes. IMPRESSION: No acute abnormality. Electronically Signed   By: Claudie Revering M.D.   On: 05/04/2019 12:12    Micro Results   Recent Results (from the past 240 hour(s))  Urine culture     Status: Abnormal   Collection Time: 05/04/19 11:25 AM   Specimen: Urine, Clean Catch  Result Value Ref Range Status   Specimen Description   Final    URINE, CLEAN CATCH Performed at [facility], [address] [place], [place] [address]    Special Requests   Final    Normal Performed at [facility], [address] [place], [place] [address]    Culture (A)  Final    <10,000 COLONIES/mL INSIGNIFICANT GROWTH Performed at [facility] [address], [place], [place] [address]    Report Status 05/06/2019 FINAL  Final  SARS Coronavirus 2 [facility] order, Performed in [facility] hospital lab) Nasopharyngeal Nasopharyngeal Swab     Status: None   Collection Time: 05/04/19 12:18 PM   Specimen: Nasopharyngeal Swab  Result Value Ref Range Status   SARS Coronavirus  2 NEGATIVE NEGATIVE Final    Comment: (NOTE) If result is  NEGATIVE SARS-CoV-2 target nucleic acids are NOT DETECTED. The SARS-CoV-2 RNA is generally detectable in upper and lower  respiratory specimens during the acute phase of infection. The lowest  concentration of SARS-CoV-2 viral copies this assay can detect is 250  copies / mL. A negative result does not preclude SARS-CoV-2 infection  and should not be used as the sole basis for treatment or other  patient management decisions.  A negative result may occur with  improper specimen collection / handling, submission of specimen other  than nasopharyngeal swab, presence of viral mutation(s) within the  areas targeted by this assay, and inadequate number of viral copies  (<250 copies / mL). A negative result must be combined with clinical  observations, patient history, and epidemiological information. If result is POSITIVE SARS-CoV-2 target nucleic acids are DETECTED. The SARS-CoV-2 RNA is generally detectable in upper and lower  respiratory specimens dur ing the acute phase of infection.  Positive  results are indicative of active infection with SARS-CoV-2.  Clinical  correlation with patient history and other diagnostic information is  necessary to determine patient infection status.  Positive results do  not rule out bacterial infection or co-infection with other viruses. If result is PRESUMPTIVE POSTIVE SARS-CoV-2 nucleic acids MAY BE PRESENT.   A presumptive positive result was obtained on the submitted specimen  and confirmed on repeat testing.  While 2019 novel coronavirus  (SARS-CoV-2) nucleic acids may be present in the submitted sample  additional confirmatory testing may be necessary for epidemiological  and / or clinical management purposes  to differentiate between  SARS-CoV-2 and other Sarbecovirus currently known to infect humans.  If clinically indicated additional testing with an alternate test  methodology [phone]) is advised. The SARS-CoV-2 RNA is generally  detectable  in upper and lower respiratory sp ecimens during the acute  phase of infection. The expected result is Negative. Fact Sheet for Patients:  StrictlyIdeas.no Fact Sheet for Healthcare Providers: BankingDealers.co.za This test is not yet approved or cleared by the [place] FDA and has been authorized for detection and/or diagnosis of SARS-CoV-2 by FDA under an Emergency Use Authorization (EUA).  This EUA will remain in effect (meaning this test can be used) for the duration of the COVID-19 declaration under Section 564(b)(1) of the Act, 21 U.S.C. section 360bbb-3(b)(1), unless the authorization is terminated or revoked sooner. Performed at [facility], [address], [place], [place] [address]   Culture, blood (routine x 2)     Status: None   Collection Time: 05/04/19 12:25 PM   Specimen: BLOOD  Result Value Ref Range Status   Specimen Description BLOOD RIGHT ANTECUBITAL  Final   Special Requests   Final    BOTTLES DRAWN AEROBIC AND ANAEROBIC Blood Culture adequate volume   Culture   Final    NO GROWTH 5 DAYS Performed at [facility], [address], [place], [place] [address]    Report Status 05/09/2019 FINAL  Final  Culture, blood (Routine X 2) w Reflex to ID Panel     Status: None (Preliminary result)   Collection Time: 05/06/19  5:00 PM   Specimen: Right Antecubital; Blood  Result Value Ref Range Status   Specimen Description RIGHT ANTECUBITAL  Final   Special Requests   Final    BOTTLES DRAWN AEROBIC AND ANAEROBIC Blood Culture adequate volume   Culture   Final    NO GROWTH 3 DAYS Performed at [facility], [address], [place], [place] [address]    Report Status PENDING  Incomplete  Culture, blood (Routine X 2) w Reflex to ID Panel     Status: None (Preliminary result)   Collection Time: 05/06/19  5:11 PM   Specimen: Porta Cath; Blood  Result Value Ref Range Status   Specimen Description PORTA CATH  Final   Special  Requests   Final    BOTTLES DRAWN AEROBIC AND ANAEROBIC Blood Culture adequate volume   Culture   Final    NO GROWTH 3 DAYS Performed at [facility], [address] [place], [place] [address]    Report Status PENDING  Incomplete       Today   Subjective    Britteney Grambo today has no new concerns - Dyspnea on Exertion improving, no chest pains no palpitations no dizziness          Patient has been seen and examined prior to discharge   Objective   Blood pressure 122/63, pulse 95, temperature 98.8 F (37.1 C), temperature source Oral, resp. rate 20, height 5\' 4"  (1.626 m), weight 75.3 kg, SpO2 90 %.  No intake or output data in the 24 hours ending 05/09/19 1750  Exam Gen:- Awake Alert, able to speak in complete sentences HEENT:- [place].AT, No sclera icterus Neck-Supple Neck,No JVD,.  Nose-  3L/min at rest, 6L/min with activity Lungs-improving air movement bilaterally, no wheezing -Right subclavian Port-A-Cath in situ -CV- S1, S2 normal, regular,    Abd-  +ve B.Sounds, Abd Soft, No tenderness,    Extremity/Skin:- No  edema, pedal pulses present  Psych-affect is appropriate, oriented x3 Neuro-generalized weakness, no new focal deficits, no tremors   Data Review   CBC w Diff:  Lab Results  Component Value Date   WBC 5.6 05/09/2019   HGB 7.9 (L) 05/09/2019   HCT 26.2 (L) 05/09/2019   PLT 308 05/09/2019   LYMPHOPCT 8 05/04/2019   MONOPCT 8 05/04/2019   EOSPCT 3 05/04/2019   BASOPCT 1 05/04/2019    CMP:  Lab Results  Component Value Date   NA 140 05/09/2019   K 3.0 (L) 05/09/2019   CL 104 05/09/2019   CO2 28 05/09/2019   BUN 6 (L) 05/09/2019   CREATININE 0.50 05/09/2019   PROT 5.9 (L) 05/05/2019   ALBUMIN 2.7 (L) 05/05/2019   BILITOT 0.5 05/05/2019   ALKPHOS 84 05/05/2019   AST 18 05/05/2019   ALT 13 05/05/2019  .   Total Discharge time is about 33 minutes  Roxan Hockey M.D on 05/09/2019 at 5:50 PM  Go to [email or web address] -  for contact  info  [facility] - Office  [phone]

## 2019-05-09 NOTE — Plan of Care (Signed)

## 2019-05-09 NOTE — TOC Transition Note (Signed)
Transition of Care [facility]) - CM/SW Discharge Note   Patient Details  Name: Bridget Richardson MRN: [id number] Date of Birth: May 22, 1956  Transition of Care [facility]) CM/SW Contact:  Boneta Lucks, RN Phone Number: 05/09/2019, 11:28 AM   Clinical Narrative:   Patient discharging today with Home Oxygen, Ashly with [employer] will delivery a tank to the hospital. Patient updated.    Patient Goals and CMS Choice Patient states their goals for this hospitalization and ongoing recovery are:: to go home. CMS Medicare.gov Compare Post Acute Care list provided to:: Patient Choice offered to / list presented to : Patient   Discharge Plan and Services   Discharge Planning Services: Other - See comment(Home Oxygen)            DME Arranged: Oxygen DME Agency: [employer] Date DME Agency Contacted: 05/08/19 Time DME Agency Contacted: [id number] Representative spoke with at DME Agency: Caryl Pina       Readmission Risk Interventions No flowsheet data found.

## 2019-05-11 ENCOUNTER — Ambulatory Visit (HOSPITAL_COMMUNITY): Payer: PRIVATE HEALTH INSURANCE

## 2019-05-11 ENCOUNTER — Other Ambulatory Visit (HOSPITAL_COMMUNITY): Payer: PRIVATE HEALTH INSURANCE

## 2019-05-11 ENCOUNTER — Ambulatory Visit (HOSPITAL_COMMUNITY): Payer: PRIVATE HEALTH INSURANCE | Admitting: Hematology

## 2019-05-11 LAB — CULTURE, BLOOD (ROUTINE X 2)
Culture: NO GROWTH
Culture: NO GROWTH
Special Requests: ADEQUATE
Special Requests: ADEQUATE

## 2019-05-14 ENCOUNTER — Encounter (HOSPITAL_COMMUNITY): Payer: Self-pay | Admitting: *Deleted

## 2019-05-15 ENCOUNTER — Encounter (HOSPITAL_COMMUNITY): Payer: Self-pay | Admitting: *Deleted

## 2019-05-16 ENCOUNTER — Other Ambulatory Visit (HOSPITAL_COMMUNITY): Payer: Self-pay | Admitting: Hematology

## 2019-05-18 ENCOUNTER — Inpatient Hospital Stay (HOSPITAL_COMMUNITY): Payer: PRIVATE HEALTH INSURANCE

## 2019-05-18 ENCOUNTER — Encounter (HOSPITAL_COMMUNITY): Payer: Self-pay

## 2019-05-18 ENCOUNTER — Inpatient Hospital Stay (HOSPITAL_BASED_OUTPATIENT_CLINIC_OR_DEPARTMENT_OTHER): Payer: PRIVATE HEALTH INSURANCE | Admitting: Hematology

## 2019-05-18 ENCOUNTER — Other Ambulatory Visit: Payer: Self-pay

## 2019-05-18 VITALS — BP 138/76 | HR 83 | Temp 98.7°F | Resp 18 | Wt 174.4 lb

## 2019-05-18 DIAGNOSIS — R5383 Other fatigue: Secondary | ICD-10-CM | POA: Diagnosis not present

## 2019-05-18 DIAGNOSIS — Z87891 Personal history of nicotine dependence: Secondary | ICD-10-CM | POA: Diagnosis not present

## 2019-05-18 DIAGNOSIS — Z803 Family history of malignant neoplasm of breast: Secondary | ICD-10-CM | POA: Diagnosis not present

## 2019-05-18 DIAGNOSIS — Z79899 Other long term (current) drug therapy: Secondary | ICD-10-CM | POA: Diagnosis not present

## 2019-05-18 DIAGNOSIS — I2699 Other pulmonary embolism without acute cor pulmonale: Secondary | ICD-10-CM | POA: Diagnosis not present

## 2019-05-18 DIAGNOSIS — Z5111 Encounter for antineoplastic chemotherapy: Secondary | ICD-10-CM | POA: Diagnosis not present

## 2019-05-18 DIAGNOSIS — Z170 Estrogen receptor positive status [ER+]: Secondary | ICD-10-CM

## 2019-05-18 DIAGNOSIS — C50412 Malignant neoplasm of upper-outer quadrant of left female breast: Secondary | ICD-10-CM

## 2019-05-18 DIAGNOSIS — R197 Diarrhea, unspecified: Secondary | ICD-10-CM | POA: Diagnosis not present

## 2019-05-18 DIAGNOSIS — D649 Anemia, unspecified: Secondary | ICD-10-CM | POA: Diagnosis not present

## 2019-05-18 DIAGNOSIS — Z9981 Dependence on supplemental oxygen: Secondary | ICD-10-CM | POA: Diagnosis not present

## 2019-05-18 DIAGNOSIS — R0602 Shortness of breath: Secondary | ICD-10-CM | POA: Diagnosis not present

## 2019-05-18 DIAGNOSIS — G479 Sleep disorder, unspecified: Secondary | ICD-10-CM | POA: Diagnosis not present

## 2019-05-18 LAB — CBC WITH DIFFERENTIAL/PLATELET
Abs Immature Granulocytes: 0.03 10*3/uL (ref 0.00–0.07)
Basophils Absolute: 0 10*3/uL (ref 0.0–0.1)
Basophils Relative: 0 %
Eosinophils Absolute: 0.2 10*3/uL (ref 0.0–0.5)
Eosinophils Relative: 2 %
HCT: 28.8 % — ABNORMAL LOW (ref 36.0–46.0)
Hemoglobin: 8.3 g/dL — ABNORMAL LOW (ref 12.0–15.0)
Immature Granulocytes: 0 %
Lymphocytes Relative: 11 %
Lymphs Abs: 0.7 10*3/uL (ref 0.7–4.0)
MCH: 28.1 pg (ref 26.0–34.0)
MCHC: 28.8 g/dL — ABNORMAL LOW (ref 30.0–36.0)
MCV: 97.6 fL (ref 80.0–100.0)
Monocytes Absolute: 1 10*3/uL (ref 0.1–1.0)
Monocytes Relative: 15 %
Neutro Abs: 4.8 10*3/uL (ref 1.7–7.7)
Neutrophils Relative %: 72 %
Platelets: 319 10*3/uL (ref 150–400)
RBC: 2.95 MIL/uL — ABNORMAL LOW (ref 3.87–5.11)
RDW: 16.2 % — ABNORMAL HIGH (ref 11.5–15.5)
WBC: 6.8 10*3/uL (ref 4.0–10.5)
nRBC: 0 % (ref 0.0–0.2)

## 2019-05-18 LAB — COMPREHENSIVE METABOLIC PANEL
ALT: 15 U/L (ref 0–44)
AST: 14 U/L — ABNORMAL LOW (ref 15–41)
Albumin: 3.1 g/dL — ABNORMAL LOW (ref 3.5–5.0)
Alkaline Phosphatase: 106 U/L (ref 38–126)
Anion gap: 8 (ref 5–15)
BUN: 8 mg/dL (ref 8–23)
CO2: 29 mmol/L (ref 22–32)
Calcium: 9.3 mg/dL (ref 8.9–10.3)
Chloride: 102 mmol/L (ref 98–111)
Creatinine, Ser: 0.52 mg/dL (ref 0.44–1.00)
GFR calc Af Amer: >60 mL/min (ref >60)
GFR calc non Af Amer: >60 mL/min (ref >60)
Glucose, Bld: 128 mg/dL — ABNORMAL HIGH (ref 70–99)
Potassium: 3.8 mmol/L (ref 3.5–5.1)
Sodium: 139 mmol/L (ref 135–145)
Total Bilirubin: 0.3 mg/dL (ref 0.3–1.2)
Total Protein: 6.5 g/dL (ref 6.5–8.1)

## 2019-05-18 MED ORDER — HEPARIN SOD (PORK) LOCK FLUSH 100 UNIT/ML IV SOLN
500.0000 [IU] | Freq: Once | INTRAVENOUS | Status: AC | PRN
  Administered 2019-05-18: 500 [IU]

## 2019-05-18 MED ORDER — FAMOTIDINE IN NACL 20-0.9 MG/50ML-% IV SOLN
20.0000 mg | Freq: Once | INTRAVENOUS | Status: AC
  Administered 2019-05-18: 20 mg via INTRAVENOUS
  Filled 2019-05-18: qty 50

## 2019-05-18 MED ORDER — DIPHENHYDRAMINE HCL 50 MG/ML IJ SOLN
25.0000 mg | Freq: Once | INTRAMUSCULAR | Status: AC
  Administered 2019-05-18: 10:00:00 25 mg via INTRAVENOUS
  Filled 2019-05-18: qty 1

## 2019-05-18 MED ORDER — SODIUM CHLORIDE 0.9 % IV SOLN
Freq: Once | INTRAVENOUS | Status: AC
  Administered 2019-05-18: 09:00:00 via INTRAVENOUS

## 2019-05-18 MED ORDER — SODIUM CHLORIDE 0.9 % IV SOLN
80.0000 mg/m2 | Freq: Once | INTRAVENOUS | Status: AC
  Administered 2019-05-18: 162 mg via INTRAVENOUS
  Filled 2019-05-18: qty 27

## 2019-05-18 MED ORDER — SODIUM CHLORIDE 0.9% FLUSH
10.0000 mL | INTRAVENOUS | Status: DC | PRN
  Administered 2019-05-18: 08:00:00 10 mL
  Filled 2019-05-18: qty 10

## 2019-05-18 MED ORDER — SODIUM CHLORIDE 0.9 % IV SOLN
20.0000 mg | Freq: Once | INTRAVENOUS | Status: AC
  Administered 2019-05-18: 20 mg via INTRAVENOUS
  Filled 2019-05-18: qty 20

## 2019-05-18 NOTE — Patient Instructions (Signed)
[facility] Discharge Instructions for Patients Receiving Chemotherapy   Beginning January 23rd 2017 lab work for the Cancer Center will be done in the  Main lab at [place] on 1st floor. If you have a lab appointment with the Cancer Center please come in thru the  Main Entrance and check in at the main information desk   Today you received the following chemotherapy agents Taxol. Follow-up as scheduled. Call clinic for any questions or concerns  To help prevent nausea and vomiting after your treatment, we encourage you to take your nausea medication   If you develop nausea and vomiting, or diarrhea that is not controlled by your medication, call the clinic.  The clinic phone number is [phone]. Office hours are Monday-Friday 8:30am-5:00pm.  BELOW ARE SYMPTOMS THAT SHOULD BE REPORTED IMMEDIATELY:  *FEVER GREATER THAN 101.0 F  *CHILLS WITH OR WITHOUT FEVER  NAUSEA AND VOMITING THAT IS NOT CONTROLLED WITH YOUR NAUSEA MEDICATION  *UNUSUAL SHORTNESS OF BREATH  *UNUSUAL BRUISING OR BLEEDING  TENDERNESS IN MOUTH AND THROAT WITH OR WITHOUT PRESENCE OF ULCERS  *URINARY PROBLEMS  *BOWEL PROBLEMS  UNUSUAL RASH Items with * indicate a potential emergency and should be followed up as soon as possible. If you have an emergency after office hours please contact your primary care physician or go to the nearest emergency department.  Please call the clinic during office hours if you have any questions or concerns.   You may also contact the Patient Navigator at [phone] should you have any questions or need assistance in obtaining follow up care.      Resources For Cancer Patients and their Caregivers ? American Cancer Society: Can assist with transportation, wigs, general needs, runs Look Good Feel Better.        1-888-227-6333 ? Cancer Care: Provides financial assistance, online support groups, medication/co-pay assistance.  1-800-813-HOPE  (4673) ? [facility] Assists [place] cancer patients and their families through emotional , educational and financial support.  336-427-4357 ? [place] DSS Where to apply for food stamps, Medicaid and utility assistance. 336-342-1394 ? [facility]: Transportation to medical appointments. 336-347-2287 ? Social Security Administration: May apply for disability if have a Stage IV cancer. 336-342-7796 1-800-772-1213 ? [place] Aging, Disability and Transit Services: Assists with nutrition, care and transit needs. 336-349-2343         

## 2019-05-18 NOTE — Progress Notes (Signed)
[address] [place], [place] [address]   CLINIC:  Medical Oncology/Hematology  PCP:  [name], MD [address] [place] [address] [phone]   REASON FOR VISIT: Follow-up for left breast cancer.  CURRENT THERAPY: Weekly paclitaxel.  BRIEF ONCOLOGIC HISTORY:  Oncology History  Malignant neoplasm of upper-outer quadrant of left breast in female, estrogen receptor positive ([place])  01/04/2019 Initial Diagnosis   Malignant neoplasm of upper-outer quadrant of left breast in female, estrogen receptor positive ([place])   01/19/2019 Cancer Staging   Staging form: Breast, AJCC 8th Edition - Pathologic stage from 01/19/2019: Stage IIA (pT2, pN1, cM0, G3, ER+, PR+, HER2-) - Signed by Derek Jack, MD on 01/19/2019   02/01/2019 -  Chemotherapy   The patient had DOXOrubicin (ADRIAMYCIN) chemo injection 120 mg, 60 mg/m2 = 120 mg, Intravenous,  Once, 4 of 4 cycles Administration: 120 mg (02/01/2019), 120 mg (02/15/2019), 120 mg (03/02/2019), 120 mg (03/15/2019) palonosetron (ALOXI) injection 0.25 mg, 0.25 mg, Intravenous,  Once, 4 of 4 cycles Administration: 0.25 mg (02/01/2019), 0.25 mg (02/15/2019), 0.25 mg (03/02/2019), 0.25 mg (03/15/2019) pegfilgrastim (NEULASTA ONPRO KIT) injection 6 mg, 6 mg, Subcutaneous, Once, 4 of 4 cycles Administration: 6 mg (02/01/2019), 6 mg (02/15/2019), 6 mg (03/02/2019), 6 mg (03/15/2019) cyclophosphamide (CYTOXAN) 1,200 mg in sodium chloride 0.9 % 250 mL chemo infusion, 600 mg/m2 = 1,200 mg, Intravenous,  Once, 4 of 4 cycles Administration: 1,200 mg (02/01/2019), 1,200 mg (02/15/2019), 1,200 mg (03/02/2019), 1,200 mg (03/15/2019) PACLitaxel (TAXOL) 162 mg in sodium chloride 0.9 % 250 mL chemo infusion (</= 77m/m2), 80 mg/m2 = 162 mg, Intravenous,  Once, 6 of 12 cycles Administration: 162 mg (03/30/2019), 162 mg (04/06/2019), 162 mg (04/13/2019), 162 mg (04/20/2019), 162 mg (04/27/2019), 162 mg (05/18/2019) fosaprepitant (EMEND) 150 mg,  dexamethasone (DECADRON) 12 mg in sodium chloride 0.9 % 145 mL IVPB, , Intravenous,  Once, 4 of 4 cycles Administration:  (02/01/2019),  (02/15/2019),  (03/02/2019),  (03/15/2019)  for chemotherapy treatment.       CANCER STAGING: Cancer Staging Malignant neoplasm of upper-outer quadrant of left breast in female, estrogen receptor positive ([place] Staging form: Breast, AJCC 8th Edition - Clinical: No stage assigned - Unsigned - Pathologic stage from 01/19/2019: Stage IIA (pT2, pN1, cM0, G3, ER+, PR+, HER2-) - Signed by KDerek Jack MD on 01/19/2019    INTERVAL HISTORY:  Ms. [id number]. female seen for follow-up of breast cancer and chemotherapy.  Unfortunately she was hospitalized from 05/04/2019 through 05/09/2019 with pulmonary embolism.  She also received 1 unit of PRBC.  Last treatment was on 04/27/2019, week 5 of paclitaxel.  Prior to her pulmonary embolism, she was active and has been working as well.  She is taking trazodone for sleep which is helping.  Appetite is 100%.  Energy levels are 75%.  She is using oxygen 6 L/min.  She was discharged home on oxygen as her saturations were dropping without it.  She had one episode of diarrhea yesterday.   REVIEW OF SYSTEMS:  Review of Systems  Respiratory: Positive for shortness of breath.   Gastrointestinal: Positive for diarrhea.  Psychiatric/Behavioral: Positive for sleep disturbance.  All other systems reviewed and are negative.    PAST MEDICAL/SURGICAL HISTORY:  Past Medical History:  Diagnosis Date  . Cancer ([facility]    stage 2 total mastctomy left breast dx march 2020  . Coronary artery disease   . Diabetes mellitus without complication ([facility]   . Hypertension  Past Surgical History:  Procedure Laterality Date  . ANKLE SURGERY Right 2009  . MASTECTOMY MODIFIED RADICAL Left 01/04/2019   Procedure: MASTECTOMY MODIFIED RADICAL;  Surgeon: Aviva Signs, MD;  Location: [facility];  Service: General;  Laterality: Left;  . PORTACATH  PLACEMENT Right 01/30/2019   Procedure: INSERTION PORT-A-CATH;  Surgeon: Aviva Signs, MD;  Location: [facility];  Service: General;  Laterality: Right;  . WRIST SURGERY Right 1997     SOCIAL HISTORY:  Social History   Socioeconomic History  . Marital status: Divorced    Spouse name: Not on file  . Number of children: Not on file  . Years of education: Not on file  . Highest education level: Not on file  Occupational History  . Not on file  Social Needs  . Financial resource strain: Not on file  . Food insecurity    Worry: Not on file    Inability: Not on file  . Transportation needs    Medical: Not on file    Non-medical: Not on file  Tobacco Use  . Smoking status: Former Smoker    Packs/day: 0.25    Years: 2.00    Pack years: 0.50    Types: Cigarettes    Quit date: 1983    Years since quitting: 37.7  . Smokeless tobacco: Never Used  Substance and Sexual Activity  . Alcohol use: Never    Frequency: Never  . Drug use: Never  . Sexual activity: Not Currently  Lifestyle  . Physical activity    Days per week: Not on file    Minutes per session: Not on file  . Stress: Not on file  Relationships  . Social Herbalist on phone: Not on file    Gets together: Not on file    Attends religious service: Not on file    Active member of club or organization: Not on file    Attends meetings of clubs or organizations: Not on file    Relationship status: Not on file  . Intimate partner violence    Fear of current or ex partner: Not on file    Emotionally abused: Not on file    Physically abused: Not on file    Forced sexual activity: Not on file  Other Topics Concern  . Not on file  Social History Narrative  . Not on file    FAMILY HISTORY:  History reviewed. No pertinent family history.  CURRENT MEDICATIONS:  Outpatient Encounter Medications as of 05/18/2019  Medication Sig  . acetaminophen (TYLENOL) 325 MG tablet Take 2 tablets (650 mg total) by mouth every 6  (six) hours as needed for mild pain (or Fever >/= 101).  Marland Kitchen apixaban (ELIQUIS) 5 MG TABS tablet Take 1 tablet (5 mg total) by mouth 2 (two) times daily. Start this prescription around 06/06/2019 after you complete the initial starter pack of Eliquis/Apixaban  . atorvastatin (LIPITOR) 40 MG tablet Take 40 mg by mouth at bedtime.   . carvedilol (COREG) 6.25 MG tablet Take 1 tablet (6.25 mg total) by mouth 2 (two) times daily with a meal.  . CYCLOPHOSPHAMIDE IV Inject into the vein every 14 (fourteen) days.  . Eliquis DVT/PE Starter Pack (ELIQUIS STARTER PACK) 5 MG TABS Take as directed on package: start with two-69m tablets twice daily for 7 days. On day 8, switch to one-581mtablet twice daily.  . enalapril (VASOTEC) 20 MG tablet Take 20 mg by mouth 2 (two) times daily.  . ferrous sulfate  325 (65 FE) MG tablet Take 325 mg by mouth daily with breakfast.  . [EXPIRED] guaiFENesin (MUCINEX) 600 MG 12 hr tablet Take 1 tablet (600 mg total) by mouth 2 (two) times daily for 10 days. (Patient not taking: Reported on 05/18/2019)  . lidocaine-prilocaine (EMLA) cream Apply small amount to port a cath site and cover with plastic wrap 1 hour prior to chemotherapy appointments  . metFORMIN (GLUCOPHAGE) 850 MG tablet Take 850 mg by mouth daily with breakfast.   . ondansetron (ZOFRAN) 4 MG tablet Take 4 mg by mouth every 8 (eight) hours as needed for nausea or vomiting.  Marland Kitchen PACLITAXEL IV Inject into the vein once a week.  . pantoprazole (PROTONIX) 40 MG tablet Take 1 tablet (40 mg total) by mouth daily.  . predniSONE (DELTASONE) 20 MG tablet Take 1 tablet (20 mg total) by mouth daily with breakfast. For Bronchitis (Patient not taking: Reported on 05/18/2019)  . tiZANidine (ZANAFLEX) 4 MG tablet TAKE 1 TABLET BY MOUTH THREE TIMES A DAY AS NEEDED FOR FLANK PAIN  . traZODone (DESYREL) 100 MG tablet TAKE 1 TABLET BY MOUTH EVERY DAY AT BEDTIME AS NEEDED   No facility-administered encounter medications on file as of  05/18/2019.     ALLERGIES:  Allergies  Allergen Reactions  . Other     No Meat- Patient is vegetarian     PHYSICAL EXAM:  ECOG Performance status: 0  There were no vitals filed for this visit. There were no vitals filed for this visit.  Physical Exam Vitals signs reviewed.  Constitutional:      Appearance: Normal appearance.  Cardiovascular:     Rate and Rhythm: Normal rate and regular rhythm.     Heart sounds: Normal heart sounds.  Pulmonary:     Effort: Pulmonary effort is normal.     Breath sounds: Normal breath sounds.  Abdominal:     General: There is no distension.     Palpations: Abdomen is soft. There is no mass.  Musculoskeletal:        General: No swelling.  Lymphadenopathy:     Cervical: No cervical adenopathy.  Skin:    General: Skin is warm.  Neurological:     General: No focal deficit present.     Mental Status: She is alert and oriented to person, place, and time.  Psychiatric:        Mood and Affect: Mood normal.        Behavior: Behavior normal.      LABORATORY DATA:  I have reviewed the labs as listed.  CBC    Component Value Date/Time   WBC 6.8 05/18/2019 0806   RBC 2.95 (L) 05/18/2019 0806   HGB 8.3 (L) 05/18/2019 0806   HCT 28.8 (L) 05/18/2019 0806   PLT 319 05/18/2019 0806   MCV 97.6 05/18/2019 0806   MCH 28.1 05/18/2019 0806   MCHC 28.8 (L) 05/18/2019 0806   RDW 16.2 (H) 05/18/2019 0806   LYMPHSABS 0.7 05/18/2019 0806   MONOABS 1.0 05/18/2019 0806   EOSABS 0.2 05/18/2019 0806   BASOSABS 0.0 05/18/2019 0806   CMP Latest Ref Rng & Units 05/18/2019 05/09/2019 05/05/2019  Glucose 70 - 99 mg/dL 128(H) 93 104(H)  BUN 8 - 23 mg/dL 8 6(L) 14  Creatinine 0.44 - 1.00 mg/dL 0.52 0.50 0.61  Sodium 135 - 145 mmol/L 139 140 134(L)  Potassium 3.5 - 5.1 mmol/L 3.8 3.0(L) 3.7  Chloride 98 - 111 mmol/L 102 104 100  CO2 22 - 32  mmol/L _0 Calcium 8.9 - 10.3 mg/dL 9.3 8.9 8.4(L)  Total Protein 6.5 - 8.1 g/dL 6.5 - 5.9(L)  Total Bilirubin  0.3 - 1.2 mg/dL 0.3 - 0.5  Alkaline Phos 38 - 126 U/L 106 - 84  AST 15 - 41 U/L 14(L) - 18  ALT 0 - 44 U/L 15 - 13       DIAGNOSTIC IMAGING:  I have independently reviewed the scans and discussed with the patient.     ASSESSMENT & PLAN:   Malignant neoplasm of upper-outer quadrant of left breast in female, estrogen receptor positive ([place]) 1.  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 -4 cycles of dose dense AC from 02/01/2019 through 03/15/2019. -Weekly paclitaxel started on 03/30/2019. - She was hospitalized recently for pulmonary embolism.  She is feeling fine other than she is oxygen dependent. - We reviewed her blood work.  We will proceed with week 6 of treatment today. -We will reevaluate her in 1 week.  I think she will need to stay off of her work as she is oxygen dependent at this time.  2.  Unprovoked pulmonary embolism: - CT scan on 05/13/2019 showed submassive PE with right heart strain by CT evidence.  This happened while she is receiving chemotherapy in the adjuvant setting while working. - She has been on oxygen since then.  She is currently tolerating Eliquis twice daily very well. - I have told her to cut back on oxygen to 3 L at resting and 5 to 6 L on exertion. -Her hemoglobin today is 8.3.  We will closely monitor it and keep it above 8.  3.  Family history: -Sister had metastatic breast cancer at age 90.  I have recommended genetic testing.  4.  Sleep problems: -She will use trazodone 50 mg at bedtime as needed.   Total time spent is 25 minutes with more than 50% of the time spent face-to-face discussing treatment plan, counseling and coordination of care.  Orders placed this encounter:  No orders of the defined types were placed in this encounter.      Derek Jack, MD [place] [phone]

## 2019-05-18 NOTE — Progress Notes (Signed)
1430:  Pt phoned the clinic stating she has developed a dry cough since leaving the clinic today after treatment.  Denies shortness of breath, chest pain.  Reports cough is nonproductive and she only coughs when she takes deep breaths.  [name] notified - instructed to advise pt to try OTC cough medication for relief, and to report to the ED for fever, shortness of breath, chest pain, or cough productive of blood, or yellow/green sputum.  Pt made aware of the above.  She verbalizes understanding and agrees to the above.

## 2019-05-18 NOTE — Patient Instructions (Signed)
[place] at [facility] Discharge Instructions  You were seen today by [name]. He went over your recent lab results. He will see you back in 1 week for labs and follow up.  Cut your O2 down to 3L while you are sitting.  Thank you for choosing [place] at [facility] to provide your oncology and hematology care.  To afford each patient quality time with our provider, please arrive at least 15 minutes before your scheduled appointment time.   If you have a lab appointment with the [facility] please come in thru the  Main Entrance and check in at the main information desk  You need to re-schedule your appointment should you arrive 10 or more minutes late.  We strive to give you quality time with our providers, and arriving late affects you and other patients whose appointments are after yours.  Also, if you no show three or more times for appointments you may be dismissed from the clinic at the providers discretion.     Again, thank you for choosing [facility].  Our hope is that these requests will decrease the amount of time that you wait before being seen by our physicians.       _____________________________________________________________  Should you have questions after your visit to [facility], please contact our office at [phone] between the hours of 8:00 a.m. and 4:30 p.m.  Voicemails left after 4:00 p.m. will not be returned until the following business day.  For prescription refill requests, have your pharmacy contact our office and allow 72 hours.    Cancer Center Support Programs:   > Cancer Support Group  2nd Tuesday of the month 1pm-2pm, Journey Room

## 2019-05-18 NOTE — Progress Notes (Signed)
0900 Labs reviewed with and pt seen by [name] and pt approved for chemo tx today per MD                            Dorrene German tolerated Taxol infusion well without complaints or incident. VSS upon discharge. Pt discharged self ambulatory in satisfactory condition

## 2019-05-18 NOTE — Patient Instructions (Signed)
[facility] at [facility] Discharge Instructions  Labs drawn from portacath today   Thank you for choosing [facility] at [facility] to provide your oncology and hematology care.  To afford each patient quality time with our provider, please arrive at least 15 minutes before your scheduled appointment time.   If you have a lab appointment with the Cancer Center please come in thru the Main Entrance and check in at the main information desk.  You need to re-schedule your appointment should you arrive 10 or more minutes late.  We strive to give you quality time with our providers, and arriving late affects you and other patients whose appointments are after yours.  Also, if you no show three or more times for appointments you may be dismissed from the clinic at the providers discretion.     Again, thank you for choosing [facility].  Our hope is that these requests will decrease the amount of time that you wait before being seen by our physicians.       _____________________________________________________________  Should you have questions after your visit to [facility], please contact our office at [phone] between the hours of 8:00 a.m. and 4:30 p.m.  Voicemails left after 4:00 p.m. will not be returned until the following business day.  For prescription refill requests, have your pharmacy contact our office and allow 72 hours.    Due to Covid, you will need to wear a mask upon entering the hospital. If you do not have a mask, a mask will be given to you at the Main Entrance upon arrival. For doctor visits, patients may have 1 support person with them. For treatment visits, patients can not have anyone with them due to social distancing guidelines and our immunocompromised population.     

## 2019-05-21 ENCOUNTER — Encounter (HOSPITAL_COMMUNITY): Payer: Self-pay | Admitting: Hematology

## 2019-05-21 NOTE — Assessment & Plan Note (Signed)
1.  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 -4 cycles of dose dense AC from 02/01/2019 through 03/15/2019. -Weekly paclitaxel started on 03/30/2019. - She was hospitalized recently for pulmonary embolism.  She is feeling fine other than she is oxygen dependent. - We reviewed her blood work.  We will proceed with week 6 of treatment today. -We will reevaluate her in 1 week.  I think she will need to stay off of her work as she is oxygen dependent at this time.  2.  Unprovoked pulmonary embolism: - CT scan on 05/13/2019 showed submassive PE with right heart strain by CT evidence.  This happened while she is receiving chemotherapy in the adjuvant setting while working. - She has been on oxygen since then.  She is currently tolerating Eliquis twice daily very well. - I have told her to cut back on oxygen to 3 L at resting and 5 to 6 L on exertion. -Her hemoglobin today is 8.3.  We will closely monitor it and keep it above 8.  3.  Family history: -Sister had metastatic breast cancer at age 63.  I have recommended genetic testing.  4.  Sleep problems: -She will use trazodone 50 mg at bedtime as needed. 

## 2019-05-23 NOTE — Progress Notes (Signed)
05/18/19  Received notice from Dr Delton Coombes to dose reduce by 20% = 64 mg/m2.  Taxol had been made and started on patient so I calculated dose and when to stop infusion which nurse did.  Dose infused on 05/18/19 was 120 mg.  T.O. Dr Rhys Martini, PharmD

## 2019-05-25 ENCOUNTER — Inpatient Hospital Stay (HOSPITAL_BASED_OUTPATIENT_CLINIC_OR_DEPARTMENT_OTHER): Payer: Medicaid - Out of State | Admitting: Hematology

## 2019-05-25 ENCOUNTER — Other Ambulatory Visit: Payer: Self-pay

## 2019-05-25 ENCOUNTER — Inpatient Hospital Stay (HOSPITAL_COMMUNITY): Payer: Medicaid - Out of State | Attending: Hematology

## 2019-05-25 ENCOUNTER — Encounter (HOSPITAL_COMMUNITY): Payer: Self-pay | Admitting: Hematology

## 2019-05-25 ENCOUNTER — Inpatient Hospital Stay (HOSPITAL_COMMUNITY): Payer: Medicaid - Out of State

## 2019-05-25 VITALS — BP 139/62 | HR 72 | Temp 96.8°F | Resp 18

## 2019-05-25 DIAGNOSIS — Z7901 Long term (current) use of anticoagulants: Secondary | ICD-10-CM | POA: Diagnosis not present

## 2019-05-25 DIAGNOSIS — I2699 Other pulmonary embolism without acute cor pulmonale: Secondary | ICD-10-CM | POA: Diagnosis not present

## 2019-05-25 DIAGNOSIS — R000 Tachycardia, unspecified: Secondary | ICD-10-CM | POA: Insufficient documentation

## 2019-05-25 DIAGNOSIS — Z86711 Personal history of pulmonary embolism: Secondary | ICD-10-CM | POA: Insufficient documentation

## 2019-05-25 DIAGNOSIS — Z170 Estrogen receptor positive status [ER+]: Secondary | ICD-10-CM | POA: Diagnosis not present

## 2019-05-25 DIAGNOSIS — C50412 Malignant neoplasm of upper-outer quadrant of left female breast: Secondary | ICD-10-CM

## 2019-05-25 DIAGNOSIS — E669 Obesity, unspecified: Secondary | ICD-10-CM | POA: Insufficient documentation

## 2019-05-25 DIAGNOSIS — E119 Type 2 diabetes mellitus without complications: Secondary | ICD-10-CM | POA: Insufficient documentation

## 2019-05-25 DIAGNOSIS — Z803 Family history of malignant neoplasm of breast: Secondary | ICD-10-CM | POA: Insufficient documentation

## 2019-05-25 DIAGNOSIS — R410 Disorientation, unspecified: Secondary | ICD-10-CM | POA: Diagnosis not present

## 2019-05-25 DIAGNOSIS — I10 Essential (primary) hypertension: Secondary | ICD-10-CM | POA: Diagnosis not present

## 2019-05-25 DIAGNOSIS — G479 Sleep disorder, unspecified: Secondary | ICD-10-CM | POA: Diagnosis not present

## 2019-05-25 DIAGNOSIS — Z87891 Personal history of nicotine dependence: Secondary | ICD-10-CM | POA: Insufficient documentation

## 2019-05-25 DIAGNOSIS — Z79899 Other long term (current) drug therapy: Secondary | ICD-10-CM | POA: Insufficient documentation

## 2019-05-25 DIAGNOSIS — Z5111 Encounter for antineoplastic chemotherapy: Secondary | ICD-10-CM | POA: Insufficient documentation

## 2019-05-25 DIAGNOSIS — Z7984 Long term (current) use of oral hypoglycemic drugs: Secondary | ICD-10-CM | POA: Diagnosis not present

## 2019-05-25 LAB — COMPREHENSIVE METABOLIC PANEL
ALT: 17 U/L (ref 0–44)
AST: 16 U/L (ref 15–41)
Albumin: 3.5 g/dL (ref 3.5–5.0)
Alkaline Phosphatase: 94 U/L (ref 38–126)
Anion gap: 9 (ref 5–15)
BUN: 9 mg/dL (ref 8–23)
CO2: 28 mmol/L (ref 22–32)
Calcium: 9.7 mg/dL (ref 8.9–10.3)
Chloride: 104 mmol/L (ref 98–111)
Creatinine, Ser: 0.53 mg/dL (ref 0.44–1.00)
GFR calc Af Amer: >60 mL/min (ref >60)
GFR calc non Af Amer: >60 mL/min (ref >60)
Glucose, Bld: 103 mg/dL — ABNORMAL HIGH (ref 70–99)
Potassium: 3.9 mmol/L (ref 3.5–5.1)
Sodium: 141 mmol/L (ref 135–145)
Total Bilirubin: 0.4 mg/dL (ref 0.3–1.2)
Total Protein: 6.8 g/dL (ref 6.5–8.1)

## 2019-05-25 LAB — CBC WITH DIFFERENTIAL/PLATELET
Abs Immature Granulocytes: 0.04 10*3/uL (ref 0.00–0.07)
Basophils Absolute: 0 10*3/uL (ref 0.0–0.1)
Basophils Relative: 1 %
Eosinophils Absolute: 0.1 10*3/uL (ref 0.0–0.5)
Eosinophils Relative: 2 %
HCT: 28.3 % — ABNORMAL LOW (ref 36.0–46.0)
Hemoglobin: 8.3 g/dL — ABNORMAL LOW (ref 12.0–15.0)
Immature Granulocytes: 1 %
Lymphocytes Relative: 16 %
Lymphs Abs: 1 10*3/uL (ref 0.7–4.0)
MCH: 28.6 pg (ref 26.0–34.0)
MCHC: 29.3 g/dL — ABNORMAL LOW (ref 30.0–36.0)
MCV: 97.6 fL (ref 80.0–100.0)
Monocytes Absolute: 0.7 10*3/uL (ref 0.1–1.0)
Monocytes Relative: 11 %
Neutro Abs: 4.3 10*3/uL (ref 1.7–7.7)
Neutrophils Relative %: 69 %
Platelets: 254 10*3/uL (ref 150–400)
RBC: 2.9 MIL/uL — ABNORMAL LOW (ref 3.87–5.11)
RDW: 15.9 % — ABNORMAL HIGH (ref 11.5–15.5)
WBC: 6.2 10*3/uL (ref 4.0–10.5)
nRBC: 0 % (ref 0.0–0.2)

## 2019-05-25 MED ORDER — DIPHENHYDRAMINE HCL 50 MG/ML IJ SOLN
25.0000 mg | Freq: Once | INTRAMUSCULAR | Status: AC
  Administered 2019-05-25: 13:00:00 25 mg via INTRAVENOUS
  Filled 2019-05-25: qty 1

## 2019-05-25 MED ORDER — SODIUM CHLORIDE 0.9% FLUSH
10.0000 mL | INTRAVENOUS | Status: DC | PRN
  Administered 2019-05-25: 10:00:00 10 mL
  Filled 2019-05-25: qty 10

## 2019-05-25 MED ORDER — SODIUM CHLORIDE 0.9 % IV SOLN
Freq: Once | INTRAVENOUS | Status: AC
  Administered 2019-05-25: 12:00:00 via INTRAVENOUS

## 2019-05-25 MED ORDER — HEPARIN SOD (PORK) LOCK FLUSH 100 UNIT/ML IV SOLN
500.0000 [IU] | Freq: Once | INTRAVENOUS | Status: AC | PRN
  Administered 2019-05-25: 500 [IU]

## 2019-05-25 MED ORDER — FAMOTIDINE IN NACL 20-0.9 MG/50ML-% IV SOLN
20.0000 mg | Freq: Once | INTRAVENOUS | Status: AC
  Administered 2019-05-25: 20 mg via INTRAVENOUS
  Filled 2019-05-25: qty 50

## 2019-05-25 MED ORDER — SODIUM CHLORIDE 0.9 % IV SOLN
20.0000 mg | Freq: Once | INTRAVENOUS | Status: AC
  Administered 2019-05-25: 20 mg via INTRAVENOUS
  Filled 2019-05-25: qty 20

## 2019-05-25 MED ORDER — SODIUM CHLORIDE 0.9 % IV SOLN
80.0000 mg/m2 | Freq: Once | INTRAVENOUS | Status: AC
  Administered 2019-05-25: 14:00:00 162 mg via INTRAVENOUS
  Filled 2019-05-25: qty 27

## 2019-05-25 NOTE — Patient Instructions (Addendum)
[facility] at [facility] Discharge Instructions  You were seen today by Dr. Katragadda. He went over your recent lab results. He will see you back in 2 weeks for labs and follow up.   Thank you for choosing [facility] at [facility] to provide your oncology and hematology care.  To afford each patient quality time with our provider, please arrive at least 15 minutes before your scheduled appointment time.   If you have a lab appointment with the Cancer Center please come in thru the  Main Entrance and check in at the main information desk  You need to re-schedule your appointment should you arrive 10 or more minutes late.  We strive to give you quality time with our providers, and arriving late affects you and other patients whose appointments are after yours.  Also, if you no show three or more times for appointments you may be dismissed from the clinic at the providers discretion.     Again, thank you for choosing [facility].  Our hope is that these requests will decrease the amount of time that you wait before being seen by our physicians.       _____________________________________________________________  Should you have questions after your visit to [facility], please contact our office at [phone] between the hours of 8:00 a.m. and 4:30 p.m.  Voicemails left after 4:00 p.m. will not be returned until the following business day.  For prescription refill requests, have your pharmacy contact our office and allow 72 hours.    Cancer Center Support Programs:   > Cancer Support Group  2nd Tuesday of the month 1pm-2pm, Journey Room    

## 2019-05-25 NOTE — Progress Notes (Signed)
Bridget Richardson, [place] [address]   CLINIC:  Medical Oncology/Hematology  PCP:  Bridget Police, MD [address] [place] [address] [phone]   REASON FOR VISIT: Follow-up for left breast cancer.  CURRENT THERAPY: Weekly paclitaxel.  BRIEF ONCOLOGIC HISTORY:  Oncology History  Malignant neoplasm of upper-outer quadrant of left breast in female, estrogen receptor positive ([place])  01/04/2019 Initial Diagnosis   Malignant neoplasm of upper-outer quadrant of left breast in female, estrogen receptor positive ([place])   01/19/2019 Cancer Staging   Staging form: Breast, AJCC 8th Edition - Pathologic stage from 01/19/2019: Stage IIA (pT2, pN1, cM0, G3, ER+, PR+, HER2-) - Signed by Bridget Jack, MD on 01/19/2019   02/01/2019 -  Chemotherapy   The patient had DOXOrubicin (ADRIAMYCIN) chemo injection 120 mg, 60 mg/m2 = 120 mg, Intravenous,  Once, 4 of 4 cycles Administration: 120 mg (02/01/2019), 120 mg (02/15/2019), 120 mg (03/02/2019), 120 mg (03/15/2019) palonosetron (ALOXI) injection 0.25 mg, 0.25 mg, Intravenous,  Once, 4 of 4 cycles Administration: 0.25 mg (02/01/2019), 0.25 mg (02/15/2019), 0.25 mg (03/02/2019), 0.25 mg (03/15/2019) pegfilgrastim (NEULASTA ONPRO KIT) injection 6 mg, 6 mg, Subcutaneous, Once, 4 of 4 cycles Administration: 6 mg (02/01/2019), 6 mg (02/15/2019), 6 mg (03/02/2019), 6 mg (03/15/2019) cyclophosphamide (CYTOXAN) 1,200 mg in sodium chloride 0.9 % 250 mL chemo infusion, 600 mg/m2 = 1,200 mg, Intravenous,  Once, 4 of 4 cycles Administration: 1,200 mg (02/01/2019), 1,200 mg (02/15/2019), 1,200 mg (03/02/2019), 1,200 mg (03/15/2019) PACLitaxel (TAXOL) 162 mg in sodium chloride 0.9 % 250 mL chemo infusion (</= '80mg'$ /m2), 80 mg/m2 = 162 mg, Intravenous,  Once, 7 of 12 cycles Administration: 162 mg (03/30/2019), 162 mg (04/06/2019), 162 mg (04/13/2019), 162 mg (04/20/2019), 162 mg (04/27/2019), 162 mg (05/18/2019), 162 mg (05/25/2019) fosaprepitant  (EMEND) 150 mg, dexamethasone (DECADRON) 12 mg in sodium chloride 0.9 % 145 mL IVPB, , Intravenous,  Once, 4 of 4 cycles Administration:  (02/01/2019),  (02/15/2019),  (03/02/2019),  (03/15/2019)  for chemotherapy treatment.       CANCER STAGING: Cancer Staging Malignant neoplasm of upper-outer quadrant of left breast in female, estrogen receptor positive ([place]) Staging form: Breast, AJCC 8th Edition - Clinical: No stage assigned - Unsigned - Pathologic stage from 01/19/2019: Stage IIA (pT2, pN1, cM0, G3, ER+, PR+, HER2-) - Signed by Bridget Jack, MD on 01/19/2019    INTERVAL HISTORY:  Bridget Richardson 63 y.o. female seen for follow-up of breast cancer and pulmonary embolism.  Appetite is 100%.  Energy levels are 75%.  She is using 3 L/min nasal cannula.  She is tolerating it very well.  She is able to stay 30 minutes without any oxygen requirement while she is resting.  Denies any tingling or numbness in extremities.  Denies any chemotherapy related side effects from last treatment last week.   REVIEW OF SYSTEMS:  Review of Systems  Psychiatric/Behavioral: Positive for sleep disturbance.  All other systems reviewed and are negative.    PAST MEDICAL/SURGICAL HISTORY:  Past Medical History:  Diagnosis Date  . Cancer Bridget Richardson)    stage 2 total mastctomy left breast dx march 2020  . Coronary artery disease   . Diabetes mellitus without complication ([place])   . Hypertension    Past Surgical History:  Procedure Laterality Date  . ANKLE SURGERY Right 2009  . MASTECTOMY MODIFIED RADICAL Left 01/04/2019   Procedure: MASTECTOMY MODIFIED RADICAL;  Surgeon: Bridget Signs, MD;  Location: [facility];  Service: General;  Laterality: Left;  .  PORTACATH PLACEMENT Right 01/30/2019   Procedure: INSERTION PORT-A-CATH;  Surgeon: Bridget Signs, MD;  Location: [facility];  Service: General;  Laterality: Right;  . WRIST SURGERY Right 1997     SOCIAL HISTORY:  Social History   Socioeconomic History  . Marital  status: Divorced    Spouse name: Not on file  . Number of children: Not on file  . Years of education: Not on file  . Highest education level: Not on file  Occupational History  . Not on file  Social Needs  . Financial resource strain: Not on file  . Food insecurity    Worry: Not on file    Inability: Not on file  . Transportation needs    Medical: Not on file    Non-medical: Not on file  Tobacco Use  . Smoking status: Former Smoker    Packs/day: 0.25    Years: 2.00    Pack years: 0.50    Types: Cigarettes    Quit date: 1983    Years since quitting: 37.7  . Smokeless tobacco: Never Used  Substance and Sexual Activity  . Alcohol use: Never    Frequency: Never  . Drug use: Never  . Sexual activity: Not Currently  Lifestyle  . Physical activity    Days per week: Not on file    Minutes per session: Not on file  . Stress: Not on file  Relationships  . Social Herbalist on phone: Not on file    Gets together: Not on file    Attends religious service: Not on file    Active member of club or organization: Not on file    Attends meetings of clubs or organizations: Not on file    Relationship status: Not on file  . Intimate partner violence    Fear of current or ex partner: Not on file    Emotionally abused: Not on file    Physically abused: Not on file    Forced sexual activity: Not on file  Other Topics Concern  . Not on file  Social History Narrative  . Not on file    FAMILY HISTORY:  No family history on file.  CURRENT MEDICATIONS:  Outpatient Encounter Medications as of 05/25/2019  Medication Sig  . apixaban (ELIQUIS) 5 MG TABS tablet Take 1 tablet (5 mg total) by mouth 2 (two) times daily. Start this prescription around 06/06/2019 after you complete the initial starter pack of Eliquis/Apixaban  . atorvastatin (LIPITOR) 40 MG tablet Take 40 mg by mouth at bedtime.   . carvedilol (COREG) 6.25 MG tablet Take 1 tablet (6.25 mg total) by mouth 2 (two)  times daily with a meal.  . CYCLOPHOSPHAMIDE IV Inject into the vein every 14 (fourteen) days.  . enalapril (VASOTEC) 20 MG tablet Take 20 mg by mouth 2 (two) times daily.  . ferrous sulfate 325 (65 FE) MG tablet Take 325 mg by mouth daily with breakfast.  . guaiFENesin (MUCINEX) 600 MG 12 hr tablet Take 600 mg by mouth 2 (two) times daily.  Marland Kitchen lidocaine-prilocaine (EMLA) cream Apply small amount to port a cath site and cover with plastic wrap 1 hour prior to chemotherapy appointments  . metFORMIN (GLUCOPHAGE) 850 MG tablet Take 850 mg by mouth daily with breakfast.   . PACLITAXEL IV Inject into the vein once a week.  . pantoprazole (PROTONIX) 40 MG tablet Take 1 tablet (40 mg total) by mouth daily.  Marland Kitchen acetaminophen (TYLENOL) 325 MG tablet Take 2  tablets (650 mg total) by mouth every 6 (six) hours as needed for mild pain (or Fever >/= 101). (Patient not taking: Reported on 05/25/2019)  . ondansetron (ZOFRAN) 4 MG tablet Take 4 mg by mouth every 8 (eight) hours as needed for nausea or vomiting.  Marland Kitchen tiZANidine (ZANAFLEX) 4 MG tablet TAKE 1 TABLET BY MOUTH THREE TIMES A DAY AS NEEDED FOR FLANK PAIN  . traZODone (DESYREL) 100 MG tablet TAKE 1 TABLET BY MOUTH EVERY DAY AT BEDTIME AS NEEDED (Patient not taking: Reported on 05/25/2019)  . [DISCONTINUED] Eliquis DVT/PE Starter Pack (ELIQUIS STARTER PACK) 5 MG TABS Take as directed on package: start with two-62m tablets twice daily for 7 days. On day 8, switch to one-561mtablet twice daily.  . [DISCONTINUED] predniSONE (DELTASONE) 20 MG tablet Take 1 tablet (20 mg total) by mouth daily with breakfast. For Bronchitis (Patient not taking: Reported on 05/18/2019)   No facility-administered encounter medications on file as of 05/25/2019.     ALLERGIES:  Allergies  Allergen Reactions  . Other     No Meat- Patient is vegetarian     PHYSICAL EXAM:  ECOG Performance status: 0  Vitals:   05/25/19 1032  BP: (!) 145/84  Pulse: 64  Resp: 18  Temp: 97.8 F  (36.6 C)  SpO2: 100%   Filed Weights   05/25/19 1032  Weight: 174 lb 6.4 oz (79.1 kg)    Physical Exam Vitals Richardson reviewed.  Constitutional:      Appearance: Normal appearance.  Cardiovascular:     Rate and Rhythm: Normal rate and regular rhythm.     Heart sounds: Normal heart sounds.  Pulmonary:     Effort: Pulmonary effort is normal.     Breath sounds: Normal breath sounds.  Abdominal:     General: There is no distension.     Palpations: Abdomen is soft. There is no mass.  Musculoskeletal:        General: No swelling.  Lymphadenopathy:     Cervical: No cervical adenopathy.  Skin:    General: Skin is warm.  Neurological:     General: No focal deficit present.     Mental Status: She is alert and oriented to person, place, and time.  Psychiatric:        Mood and Affect: Mood normal.        Behavior: Behavior normal.      LABORATORY DATA:  I have reviewed the labs as listed.  CBC    Component Value Date/Time   WBC 6.2 05/25/2019 0953   RBC 2.90 (L) 05/25/2019 0953   HGB 8.3 (L) 05/25/2019 0953   HCT 28.3 (L) 05/25/2019 0953   PLT 254 05/25/2019 0953   MCV 97.6 05/25/2019 0953   MCH 28.6 05/25/2019 0953   MCHC 29.3 (L) 05/25/2019 0953   RDW 15.9 (H) 05/25/2019 0953   LYMPHSABS 1.0 05/25/2019 0953   MONOABS 0.7 05/25/2019 0953   EOSABS 0.1 05/25/2019 0953   BASOSABS 0.0 05/25/2019 0953   CMP Latest Ref Rng & Units 05/25/2019 05/18/2019 05/09/2019  Glucose 70 - 99 mg/dL 103(H) 128(H) 93  BUN 8 - 23 mg/dL 9 8 6(L)  Creatinine 0.44 - 1.00 mg/dL 0.53 0.52 0.50  Sodium 135 - 145 mmol/L 141 139 140  Potassium 3.5 - 5.1 mmol/L 3.9 3.8 3.0(L)  Chloride 98 - 111 mmol/L 104 102 104  CO2 22 - 32 mmol/L _0 Calcium 8.9 - 10.3 mg/dL 9.7 9.3 8.9  Total Protein 6.5 -  8.1 g/dL 6.8 6.5 -  Total Bilirubin 0.3 - 1.2 mg/dL 0.4 0.3 -  Alkaline Phos 38 - 126 U/L 94 106 -  AST 15 - 41 U/L 16 14(L) -  ALT 0 - 44 U/L 17 15 -       DIAGNOSTIC IMAGING:  I have  independently reviewed the scans and discussed with the patient.     ASSESSMENT & PLAN:   Malignant neoplasm of upper-outer quadrant of left breast in female, estrogen receptor positive ([place]) 1.  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 -4 cycles of dose dense AC from 02/01/2019 through 03/15/2019. -Weekly paclitaxel started on 03/30/2019. - Her oxygenation is continuing to improve. -She tolerated last week treatment very well.  She will proceed with week 7 of paclitaxel today.  I have reviewed her blood work. -She will come back in 2 weeks for follow-up to see me.  2.  Unprovoked pulmonary embolism: - CT scan on 05/13/2019 showed submassive PE with right heart strain by CT evidence.  This happened while she is receiving chemotherapy in the adjuvant setting while working. - She is taking Eliquis twice daily.  She has been on oxygen. - She is tolerating 3 L/min very well.  She will continue to wean it off.  3.  Family history: -Sister had metastatic breast cancer at age 23.  I have recommended genetic testing.  4.  Sleep problems: -She will use trazodone 50 mg at bedtime as needed.   Total time spent is 25 minutes with more than 50% of the time spent face-to-face discussing treatment plan, counseling and coordination of care.  Orders placed this encounter:  No orders of the defined types were placed in this encounter.     Bridget Jack, MD [place] [phone]

## 2019-05-25 NOTE — Assessment & Plan Note (Signed)
1.  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 -4 cycles of dose dense AC from 02/01/2019 through 03/15/2019. -Weekly paclitaxel started on 03/30/2019. - Her oxygenation is continuing to improve. -She tolerated last week treatment very well.  She will proceed with week 7 of paclitaxel today.  I have reviewed her blood work. -She will come back in 2 weeks for follow-up to see me.  2.  Unprovoked pulmonary embolism: - CT scan on 05/13/2019 showed submassive PE with right heart strain by CT evidence.  This happened while she is receiving chemotherapy in the adjuvant setting while working. - She is taking Eliquis twice daily.  She has been on oxygen. - She is tolerating 3 L/min very well.  She will continue to wean it off.  3.  Family history: -Sister had metastatic breast cancer at age 18.  I have recommended genetic testing.  4.  Sleep problems: -She will use trazodone 50 mg at bedtime as needed.

## 2019-05-25 NOTE — Progress Notes (Signed)
1140 Labs reviewed with and pt seen by [name] and pt approved for Taxol infusion today per MD                     Bridget Richardson tolerated Taxol infusion well without complaints or incident. VSS upon discharge. Pt discharged self ambulatory in satisfactory condition

## 2019-05-25 NOTE — Progress Notes (Signed)
05/25/19  Confirmed Taxol dose for today is at full dose of 80 mg/m2.  T.O. Dr Beckey Downing LPN/Adisa Litt Ronnald Ramp, PharmD

## 2019-05-25 NOTE — Patient Instructions (Signed)
[facility] Discharge Instructions for Patients Receiving Chemotherapy   Beginning January 23rd 2017 lab work for the Cancer Center will be done in the  Main lab at [facility] on 1st floor. If you have a lab appointment with the Cancer Center please come in thru the  Main Entrance and check in at the main information desk   Today you received the following chemotherapy agents Taxol. Follow-up as scheduled. Call clinic for any questions or concerns  To help prevent nausea and vomiting after your treatment, we encourage you to take your nausea medication   If you develop nausea and vomiting, or diarrhea that is not controlled by your medication, call the clinic.  The clinic phone number is [phone]. Office hours are Monday-Friday 8:30am-5:00pm.  BELOW ARE SYMPTOMS THAT SHOULD BE REPORTED IMMEDIATELY:  *FEVER GREATER THAN 101.0 F  *CHILLS WITH OR WITHOUT FEVER  NAUSEA AND VOMITING THAT IS NOT CONTROLLED WITH YOUR NAUSEA MEDICATION  *UNUSUAL SHORTNESS OF BREATH  *UNUSUAL BRUISING OR BLEEDING  TENDERNESS IN MOUTH AND THROAT WITH OR WITHOUT PRESENCE OF ULCERS  *URINARY PROBLEMS  *BOWEL PROBLEMS  UNUSUAL RASH Items with * indicate a potential emergency and should be followed up as soon as possible. If you have an emergency after office hours please contact your primary care physician or go to the nearest emergency department.  Please call the clinic during office hours if you have any questions or concerns.   You may also contact the Patient Navigator at [phone] should you have any questions or need assistance in obtaining follow up care.      Resources For Cancer Patients and their Caregivers ? American Cancer Society: Can assist with transportation, wigs, general needs, runs Look Good Feel Better.        1-888-227-6333 ? Cancer Care: Provides financial assistance, online support groups, medication/co-pay assistance.  1-800-813-HOPE  (4673) ? [facility] Assists [place] cancer patients and their families through emotional , educational and financial support.  336-427-4357 ? [place] DSS Where to apply for food stamps, Medicaid and utility assistance. 336-342-1394 ? [facility]: Transportation to medical appointments. 336-347-2287 ? Social Security Administration: May apply for disability if have a Stage IV cancer. 336-342-7796 1-800-772-1213 ? [place] Aging, Disability and Transit Services: Assists with nutrition, care and transit needs. [phone]         

## 2019-05-26 ENCOUNTER — Encounter (HOSPITAL_COMMUNITY): Payer: Self-pay | Admitting: *Deleted

## 2019-06-01 ENCOUNTER — Inpatient Hospital Stay (HOSPITAL_COMMUNITY): Payer: Medicaid - Out of State

## 2019-06-01 ENCOUNTER — Encounter (HOSPITAL_COMMUNITY): Payer: PRIVATE HEALTH INSURANCE | Admitting: Genetic Counselor

## 2019-06-01 ENCOUNTER — Other Ambulatory Visit: Payer: Self-pay

## 2019-06-01 ENCOUNTER — Ambulatory Visit (HOSPITAL_COMMUNITY): Payer: PRIVATE HEALTH INSURANCE | Admitting: Hematology

## 2019-06-01 VITALS — BP 138/59 | HR 81 | Temp 96.8°F | Resp 18 | Wt 173.8 lb

## 2019-06-01 DIAGNOSIS — Z170 Estrogen receptor positive status [ER+]: Secondary | ICD-10-CM

## 2019-06-01 DIAGNOSIS — C50412 Malignant neoplasm of upper-outer quadrant of left female breast: Secondary | ICD-10-CM

## 2019-06-01 DIAGNOSIS — Z5111 Encounter for antineoplastic chemotherapy: Secondary | ICD-10-CM | POA: Diagnosis not present

## 2019-06-01 LAB — CBC WITH DIFFERENTIAL/PLATELET
Abs Immature Granulocytes: 0.03 10*3/uL (ref 0.00–0.07)
Basophils Absolute: 0 10*3/uL (ref 0.0–0.1)
Basophils Relative: 0 %
Eosinophils Absolute: 0.1 10*3/uL (ref 0.0–0.5)
Eosinophils Relative: 2 %
HCT: 30.7 % — ABNORMAL LOW (ref 36.0–46.0)
Hemoglobin: 9.2 g/dL — ABNORMAL LOW (ref 12.0–15.0)
Immature Granulocytes: 1 %
Lymphocytes Relative: 13 %
Lymphs Abs: 0.8 10*3/uL (ref 0.7–4.0)
MCH: 28.4 pg (ref 26.0–34.0)
MCHC: 30 g/dL (ref 30.0–36.0)
MCV: 94.8 fL (ref 80.0–100.0)
Monocytes Absolute: 0.5 10*3/uL (ref 0.1–1.0)
Monocytes Relative: 8 %
Neutro Abs: 4.8 10*3/uL (ref 1.7–7.7)
Neutrophils Relative %: 76 %
Platelets: 254 10*3/uL (ref 150–400)
RBC: 3.24 MIL/uL — ABNORMAL LOW (ref 3.87–5.11)
RDW: 15.8 % — ABNORMAL HIGH (ref 11.5–15.5)
WBC: 6.3 10*3/uL (ref 4.0–10.5)
nRBC: 0 % (ref 0.0–0.2)

## 2019-06-01 LAB — COMPREHENSIVE METABOLIC PANEL
ALT: 20 U/L (ref 0–44)
AST: 16 U/L (ref 15–41)
Albumin: 3.7 g/dL (ref 3.5–5.0)
Alkaline Phosphatase: 86 U/L (ref 38–126)
Anion gap: 10 (ref 5–15)
BUN: 9 mg/dL (ref 8–23)
CO2: 25 mmol/L (ref 22–32)
Calcium: 9.5 mg/dL (ref 8.9–10.3)
Chloride: 106 mmol/L (ref 98–111)
Creatinine, Ser: 0.5 mg/dL (ref 0.44–1.00)
GFR calc Af Amer: >60 mL/min (ref >60)
GFR calc non Af Amer: >60 mL/min (ref >60)
Glucose, Bld: 117 mg/dL — ABNORMAL HIGH (ref 70–99)
Potassium: 3.8 mmol/L (ref 3.5–5.1)
Sodium: 141 mmol/L (ref 135–145)
Total Bilirubin: 0.2 mg/dL — ABNORMAL LOW (ref 0.3–1.2)
Total Protein: 7 g/dL (ref 6.5–8.1)

## 2019-06-01 MED ORDER — FAMOTIDINE IN NACL 20-0.9 MG/50ML-% IV SOLN
20.0000 mg | Freq: Once | INTRAVENOUS | Status: AC
  Administered 2019-06-01: 20 mg via INTRAVENOUS
  Filled 2019-06-01: qty 50

## 2019-06-01 MED ORDER — SODIUM CHLORIDE 0.9 % IV SOLN
Freq: Once | INTRAVENOUS | Status: AC
  Administered 2019-06-01: 11:00:00 via INTRAVENOUS

## 2019-06-01 MED ORDER — SODIUM CHLORIDE 0.9 % IV SOLN
80.0000 mg/m2 | Freq: Once | INTRAVENOUS | Status: AC
  Administered 2019-06-01: 162 mg via INTRAVENOUS
  Filled 2019-06-01: qty 27

## 2019-06-01 MED ORDER — SODIUM CHLORIDE 0.9% FLUSH
10.0000 mL | INTRAVENOUS | Status: DC | PRN
  Administered 2019-06-01: 10 mL
  Filled 2019-06-01: qty 10

## 2019-06-01 MED ORDER — SODIUM CHLORIDE 0.9 % IV SOLN
20.0000 mg | Freq: Once | INTRAVENOUS | Status: AC
  Administered 2019-06-01: 20 mg via INTRAVENOUS
  Filled 2019-06-01: qty 20

## 2019-06-01 MED ORDER — DIPHENHYDRAMINE HCL 50 MG/ML IJ SOLN
25.0000 mg | Freq: Once | INTRAMUSCULAR | Status: AC
  Administered 2019-06-01: 25 mg via INTRAVENOUS
  Filled 2019-06-01: qty 1

## 2019-06-01 MED ORDER — HEPARIN SOD (PORK) LOCK FLUSH 100 UNIT/ML IV SOLN
500.0000 [IU] | Freq: Once | INTRAVENOUS | Status: AC | PRN
  Administered 2019-06-01: 500 [IU]

## 2019-06-01 NOTE — Progress Notes (Signed)
Bridget Richardson tolerated Taxol infusion without incident or complaints. VSS. Discharged in satisfactory condition with follow up instructions.

## 2019-06-01 NOTE — Patient Instructions (Signed)
[facility] Discharge Instructions for Patients Receiving Chemotherapy   Beginning January 23rd 2017 lab work for the Cancer Center will be done in the  Main lab at [facility] on 1st floor. If you have a lab appointment with the Cancer Center please come in thru the  Main Entrance and check in at the main information desk   Today you received the following chemotherapy agents Taxol  To help prevent nausea and vomiting after your treatment, we encourage you to take your nausea medication      If you develop nausea and vomiting, or diarrhea that is not controlled by your medication, call the clinic.  The clinic phone number is [phone]. Office hours are Monday-Friday 8:30am-5:00pm.  BELOW ARE SYMPTOMS THAT SHOULD BE REPORTED IMMEDIATELY:  *FEVER GREATER THAN 101.0 F  *CHILLS WITH OR WITHOUT FEVER  NAUSEA AND VOMITING THAT IS NOT CONTROLLED WITH YOUR NAUSEA MEDICATION  *UNUSUAL SHORTNESS OF BREATH  *UNUSUAL BRUISING OR BLEEDING  TENDERNESS IN MOUTH AND THROAT WITH OR WITHOUT PRESENCE OF ULCERS  *URINARY PROBLEMS  *BOWEL PROBLEMS  UNUSUAL RASH Items with * indicate a potential emergency and should be followed up as soon as possible. If you have an emergency after office hours please contact your primary care physician or go to the nearest emergency department.  Please call the clinic during office hours if you have any questions or concerns.   You may also contact the Patient Navigator at [phone] should you have any questions or need assistance in obtaining follow up care.      Resources For Cancer Patients and their Caregivers ? American Cancer Society: Can assist with transportation, wigs, general needs, runs Look Good Feel Better.        1-888-227-6333 ? Cancer Care: Provides financial assistance, online support groups, medication/co-pay assistance.  1-800-813-HOPE (4673) ? [facility] Assists [place]  cancer patients and their families through emotional , educational and financial support.  336-427-4357 ? [place] DSS Where to apply for food stamps, Medicaid and utility assistance. 336-342-1394 ? [facility]: Transportation to medical appointments. 336-347-2287 ? Social Security Administration: May apply for disability if have a Stage IV cancer. 336-342-7796 1-800-772-1213 ? [place] Aging, Disability and Transit Services: Assists with nutrition, care and transit needs. 336-349-2343         

## 2019-06-07 ENCOUNTER — Encounter (HOSPITAL_COMMUNITY): Payer: Self-pay | Admitting: *Deleted

## 2019-06-07 ENCOUNTER — Other Ambulatory Visit: Payer: Self-pay

## 2019-06-08 ENCOUNTER — Inpatient Hospital Stay (HOSPITAL_COMMUNITY): Payer: Medicaid - Out of State

## 2019-06-08 ENCOUNTER — Inpatient Hospital Stay (HOSPITAL_BASED_OUTPATIENT_CLINIC_OR_DEPARTMENT_OTHER): Payer: Medicaid - Out of State | Admitting: Hematology

## 2019-06-08 ENCOUNTER — Encounter (HOSPITAL_COMMUNITY): Payer: Self-pay | Admitting: Hematology

## 2019-06-08 VITALS — BP 134/64 | HR 87 | Temp 98.6°F | Resp 16

## 2019-06-08 DIAGNOSIS — Z170 Estrogen receptor positive status [ER+]: Secondary | ICD-10-CM

## 2019-06-08 DIAGNOSIS — C50412 Malignant neoplasm of upper-outer quadrant of left female breast: Secondary | ICD-10-CM

## 2019-06-08 DIAGNOSIS — Z5111 Encounter for antineoplastic chemotherapy: Secondary | ICD-10-CM | POA: Diagnosis not present

## 2019-06-08 LAB — COMPREHENSIVE METABOLIC PANEL
ALT: 23 U/L (ref 0–44)
AST: 16 U/L (ref 15–41)
Albumin: 3.7 g/dL (ref 3.5–5.0)
Alkaline Phosphatase: 71 U/L (ref 38–126)
Anion gap: 9 (ref 5–15)
BUN: 12 mg/dL (ref 8–23)
CO2: 26 mmol/L (ref 22–32)
Calcium: 9.6 mg/dL (ref 8.9–10.3)
Chloride: 106 mmol/L (ref 98–111)
Creatinine, Ser: 0.54 mg/dL (ref 0.44–1.00)
GFR calc Af Amer: >60 mL/min (ref >60)
GFR calc non Af Amer: >60 mL/min (ref >60)
Glucose, Bld: 96 mg/dL (ref 70–99)
Potassium: 3.8 mmol/L (ref 3.5–5.1)
Sodium: 141 mmol/L (ref 135–145)
Total Bilirubin: 0.5 mg/dL (ref 0.3–1.2)
Total Protein: 6.8 g/dL (ref 6.5–8.1)

## 2019-06-08 LAB — CBC WITH DIFFERENTIAL/PLATELET
Abs Immature Granulocytes: 0.05 10*3/uL (ref 0.00–0.07)
Basophils Absolute: 0 10*3/uL (ref 0.0–0.1)
Basophils Relative: 0 %
Eosinophils Absolute: 0.1 10*3/uL (ref 0.0–0.5)
Eosinophils Relative: 2 %
HCT: 28.9 % — ABNORMAL LOW (ref 36.0–46.0)
Hemoglobin: 8.5 g/dL — ABNORMAL LOW (ref 12.0–15.0)
Immature Granulocytes: 1 %
Lymphocytes Relative: 15 %
Lymphs Abs: 0.9 10*3/uL (ref 0.7–4.0)
MCH: 28.1 pg (ref 26.0–34.0)
MCHC: 29.4 g/dL — ABNORMAL LOW (ref 30.0–36.0)
MCV: 95.4 fL (ref 80.0–100.0)
Monocytes Absolute: 0.5 10*3/uL (ref 0.1–1.0)
Monocytes Relative: 9 %
Neutro Abs: 4.5 10*3/uL (ref 1.7–7.7)
Neutrophils Relative %: 73 %
Platelets: 293 10*3/uL (ref 150–400)
RBC: 3.03 MIL/uL — ABNORMAL LOW (ref 3.87–5.11)
RDW: 15.8 % — ABNORMAL HIGH (ref 11.5–15.5)
WBC: 6.1 10*3/uL (ref 4.0–10.5)
nRBC: 0 % (ref 0.0–0.2)

## 2019-06-08 MED ORDER — SODIUM CHLORIDE 0.9 % IV SOLN
Freq: Once | INTRAVENOUS | Status: AC
  Administered 2019-06-08: 09:00:00 via INTRAVENOUS

## 2019-06-08 MED ORDER — SODIUM CHLORIDE 0.9% FLUSH
10.0000 mL | INTRAVENOUS | Status: DC | PRN
  Administered 2019-06-08: 10 mL
  Filled 2019-06-08: qty 10

## 2019-06-08 MED ORDER — SODIUM CHLORIDE 0.9 % IV SOLN
80.0000 mg/m2 | Freq: Once | INTRAVENOUS | Status: AC
  Administered 2019-06-08: 162 mg via INTRAVENOUS
  Filled 2019-06-08: qty 27

## 2019-06-08 MED ORDER — FAMOTIDINE IN NACL 20-0.9 MG/50ML-% IV SOLN
INTRAVENOUS | Status: AC
  Filled 2019-06-08: qty 50

## 2019-06-08 MED ORDER — DIPHENHYDRAMINE HCL 50 MG/ML IJ SOLN
25.0000 mg | Freq: Once | INTRAMUSCULAR | Status: AC
  Administered 2019-06-08: 10:00:00 25 mg via INTRAVENOUS
  Filled 2019-06-08: qty 1

## 2019-06-08 MED ORDER — FAMOTIDINE IN NACL 20-0.9 MG/50ML-% IV SOLN
20.0000 mg | Freq: Once | INTRAVENOUS | Status: AC
  Administered 2019-06-08: 20 mg via INTRAVENOUS
  Filled 2019-06-08: qty 50

## 2019-06-08 MED ORDER — HEPARIN SOD (PORK) LOCK FLUSH 100 UNIT/ML IV SOLN
500.0000 [IU] | Freq: Once | INTRAVENOUS | Status: AC | PRN
  Administered 2019-06-08: 12:00:00 500 [IU]

## 2019-06-08 MED ORDER — SODIUM CHLORIDE 0.9 % IV SOLN
20.0000 mg | Freq: Once | INTRAVENOUS | Status: AC
  Administered 2019-06-08: 20 mg via INTRAVENOUS
  Filled 2019-06-08: qty 20

## 2019-06-08 NOTE — Patient Instructions (Signed)
[facility] Discharge Instructions for Patients Receiving Chemotherapy  Today you received the following chemotherapy agents   To help prevent nausea and vomiting after your treatment, we encourage you to take your nausea medication   If you develop nausea and vomiting that is not controlled by your nausea medication, call the clinic.   BELOW ARE SYMPTOMS THAT SHOULD BE REPORTED IMMEDIATELY:  *FEVER GREATER THAN 100.5 F  *CHILLS WITH OR WITHOUT FEVER  NAUSEA AND VOMITING THAT IS NOT CONTROLLED WITH YOUR NAUSEA MEDICATION  *UNUSUAL SHORTNESS OF BREATH  *UNUSUAL BRUISING OR BLEEDING  TENDERNESS IN MOUTH AND THROAT WITH OR WITHOUT PRESENCE OF ULCERS  *URINARY PROBLEMS  *BOWEL PROBLEMS  UNUSUAL RASH Items with * indicate a potential emergency and should be followed up as soon as possible.  Feel free to call the clinic should you have any questions or concerns. The clinic phone number is [phone].  Please show the CHEMO ALERT CARD at check-in to the Emergency Department and triage nurse.   

## 2019-06-08 NOTE — Progress Notes (Signed)
Bridget Richardson, [place] [address]   CLINIC:  Medical Oncology/Hematology  PCP:  [name], MD [address] [place] [address] [phone]   REASON FOR VISIT: Follow-up for left breast cancer.  CURRENT THERAPY: Weekly paclitaxel.  BRIEF ONCOLOGIC HISTORY:  Oncology History  Malignant neoplasm of upper-outer quadrant of left breast in female, estrogen receptor positive ([place])  01/04/2019 Initial Diagnosis   Malignant neoplasm of upper-outer quadrant of left breast in female, estrogen receptor positive ([place])   01/19/2019 Cancer Staging   Staging form: Breast, AJCC 8th Edition - Pathologic stage from 01/19/2019: Stage IIA (pT2, pN1, cM0, G3, ER+, PR+, HER2-) - Signed by Derek Jack, MD on 01/19/2019   02/01/2019 -  Chemotherapy   The patient had DOXOrubicin (ADRIAMYCIN) chemo injection 120 mg, 60 mg/m2 = 120 mg, Intravenous,  Once, 4 of 4 cycles Administration: 120 mg (02/01/2019), 120 mg (02/15/2019), 120 mg (03/02/2019), 120 mg (03/15/2019) palonosetron (ALOXI) injection 0.25 mg, 0.25 mg, Intravenous,  Once, 4 of 4 cycles Administration: 0.25 mg (02/01/2019), 0.25 mg (02/15/2019), 0.25 mg (03/02/2019), 0.25 mg (03/15/2019) pegfilgrastim (NEULASTA ONPRO KIT) injection 6 mg, 6 mg, Subcutaneous, Once, 4 of 4 cycles Administration: 6 mg (02/01/2019), 6 mg (02/15/2019), 6 mg (03/02/2019), 6 mg (03/15/2019) cyclophosphamide (CYTOXAN) 1,200 mg in sodium chloride 0.9 % 250 mL chemo infusion, 600 mg/m2 = 1,200 mg, Intravenous,  Once, 4 of 4 cycles Administration: 1,200 mg (02/01/2019), 1,200 mg (02/15/2019), 1,200 mg (03/02/2019), 1,200 mg (03/15/2019) PACLitaxel (TAXOL) 162 mg in sodium chloride 0.9 % 250 mL chemo infusion (</= 58m/m2), 80 mg/m2 = 162 mg, Intravenous,  Once, 9 of 12 cycles Administration: 162 mg (03/30/2019), 162 mg (04/06/2019), 162 mg (04/13/2019), 162 mg (04/20/2019), 162 mg (04/27/2019), 162 mg (05/18/2019), 162 mg (05/25/2019), 162 mg  (06/01/2019), 162 mg (06/08/2019) fosaprepitant (EMEND) 150 mg, dexamethasone (DECADRON) 12 mg in sodium chloride 0.9 % 145 mL IVPB, , Intravenous,  Once, 4 of 4 cycles Administration:  (02/01/2019),  (02/15/2019),  (03/02/2019),  (03/15/2019)  for chemotherapy treatment.       CANCER STAGING: Cancer Staging Malignant neoplasm of upper-outer quadrant of left breast in female, estrogen receptor positive ([place] Staging form: Breast, AJCC 8th Edition - Clinical: No stage assigned - Unsigned - Pathologic stage from 01/19/2019: Stage IIA (pT2, pN1, cM0, G3, ER+, PR+, HER2-) - Signed by KDerek Jack MD on 01/19/2019    INTERVAL HISTORY:  Ms. [id number].[id number]. female seen for follow-up of chemotherapy for her breast cancer.  She is tolerating paclitaxel reasonably well.  She is also taking Eliquis twice daily without any problems.  She has stopped using oxygen on 05/30/2019.  Her saturations are staying in the 100 range.  Denies nausea, vomiting, diarrhea or constipation.  She is taking trazodone at bedtime for sleep problems.   REVIEW OF SYSTEMS:  Review of Systems  Psychiatric/Behavioral: Positive for sleep disturbance.  All other systems reviewed and are negative.    PAST MEDICAL/SURGICAL HISTORY:  Past Medical History:  Diagnosis Date  . Cancer [facility]    stage 2 total mastctomy left breast dx march 2020  . Coronary artery disease   . Diabetes mellitus without complication ([facility] [place]   . Hypertension    Past Surgical History:  Procedure Laterality Date  . ANKLE SURGERY Right 2009  . MASTECTOMY MODIFIED RADICAL Left 01/04/2019   Procedure: MASTECTOMY MODIFIED RADICAL;  Surgeon: JAviva Signs MD;  Location: [facility];  Service: General;  Laterality: Left;  . PORTACATH  PLACEMENT Right 01/30/2019   Procedure: INSERTION PORT-A-CATH;  Surgeon: Aviva Signs, MD;  Location: [facility];  Service: General;  Laterality: Right;  . WRIST SURGERY Right 1997     SOCIAL HISTORY:  Social History    Socioeconomic History  . Marital status: Divorced    Spouse name: Not on file  . Number of children: Not on file  . Years of education: Not on file  . Highest education level: Not on file  Occupational History  . Not on file  Social Needs  . Financial resource strain: Not on file  . Food insecurity    Worry: Not on file    Inability: Not on file  . Transportation needs    Medical: Not on file    Non-medical: Not on file  Tobacco Use  . Smoking status: Former Smoker    Packs/day: 0.25    Years: 2.00    Pack years: 0.50    Types: Cigarettes    Quit date: 1983    Years since quitting: 37.8  . Smokeless tobacco: Never Used  Substance and Sexual Activity  . Alcohol use: Never    Frequency: Never  . Drug use: Never  . Sexual activity: Not Currently  Lifestyle  . Physical activity    Days per week: Not on file    Minutes per session: Not on file  . Stress: Not on file  Relationships  . Social Herbalist on phone: Not on file    Gets together: Not on file    Attends religious service: Not on file    Active member of club or organization: Not on file    Attends meetings of clubs or organizations: Not on file    Relationship status: Not on file  . Intimate partner violence    Fear of current or ex partner: Not on file    Emotionally abused: Not on file    Physically abused: Not on file    Forced sexual activity: Not on file  Other Topics Concern  . Not on file  Social History Narrative  . Not on file    FAMILY HISTORY:  History reviewed. No pertinent family history.  CURRENT MEDICATIONS:  Outpatient Encounter Medications as of 06/08/2019  Medication Sig  . apixaban (ELIQUIS) 5 MG TABS tablet Take 1 tablet (5 mg total) by mouth 2 (two) times daily. Start this prescription around 06/06/2019 after you complete the initial starter pack of Eliquis/Apixaban  . atorvastatin (LIPITOR) 40 MG tablet Take 40 mg by mouth at bedtime.   . carvedilol (COREG) 6.25 MG  tablet Take 1 tablet (6.25 mg total) by mouth 2 (two) times daily with a meal.  . CYCLOPHOSPHAMIDE IV Inject into the vein every 14 (fourteen) days.  . enalapril (VASOTEC) 20 MG tablet Take 20 mg by mouth 2 (two) times daily.  . ferrous sulfate 325 (65 FE) MG tablet Take 325 mg by mouth daily with breakfast.  . metFORMIN (GLUCOPHAGE) 850 MG tablet Take 850 mg by mouth daily with breakfast.   . PACLITAXEL IV Inject into the vein once a week.  . pantoprazole (PROTONIX) 40 MG tablet Take 1 tablet (40 mg total) by mouth daily.  . Vitamin D, Ergocalciferol, (DRISDOL) 1.25 MG (50000 UT) CAPS capsule TAKE 1 CAPSULE ORALLY ONCE A WEEK FOR 4 WEEKS THEN ONCE MONTHLY  . acetaminophen (TYLENOL) 325 MG tablet Take 2 tablets (650 mg total) by mouth every 6 (six) hours as needed for mild pain (or Fever >/=  101). (Patient not taking: Reported on 05/25/2019)  . lidocaine-prilocaine (EMLA) cream Apply small amount to port a cath site and cover with plastic wrap 1 hour prior to chemotherapy appointments (Patient not taking: Reported on 06/08/2019)  . traZODone (DESYREL) 100 MG tablet TAKE 1 TABLET BY MOUTH EVERY DAY AT BEDTIME AS NEEDED (Patient not taking: Reported on 05/25/2019)  . [DISCONTINUED] guaiFENesin (MUCINEX) 600 MG 12 hr tablet Take 600 mg by mouth 2 (two) times daily.  . [DISCONTINUED] ondansetron (ZOFRAN) 4 MG tablet Take 4 mg by mouth every 8 (eight) hours as needed for nausea or vomiting.  . [DISCONTINUED] tiZANidine (ZANAFLEX) 4 MG tablet TAKE 1 TABLET BY MOUTH THREE TIMES A DAY AS NEEDED FOR FLANK PAIN   No facility-administered encounter medications on file as of 06/08/2019.     ALLERGIES:  Allergies  Allergen Reactions  . Other     No Meat- Patient is vegetarian     PHYSICAL EXAM:  ECOG Performance status: 0  Vitals:   06/08/19 0823  BP: (!) 152/73  Pulse: 70  Resp: 18  Temp: (!) 97.3 F (36.3 C)  SpO2: 100%   Filed Weights   06/08/19 0823  Weight: 174 lb 6.4 oz (79.1 kg)     Physical Exam Vitals signs reviewed.  Constitutional:      Appearance: Normal appearance.  Cardiovascular:     Rate and Rhythm: Normal rate and regular rhythm.     Heart sounds: Normal heart sounds.  Pulmonary:     Effort: Pulmonary effort is normal.     Breath sounds: Normal breath sounds.  Abdominal:     General: There is no distension.     Palpations: Abdomen is soft. There is no mass.  Musculoskeletal:        General: No swelling.  Lymphadenopathy:     Cervical: No cervical adenopathy.  Skin:    General: Skin is warm.  Neurological:     General: No focal deficit present.     Mental Status: She is alert and oriented to person, place, and time.  Psychiatric:        Mood and Affect: Mood normal.        Behavior: Behavior normal.      LABORATORY DATA:  I have reviewed the labs as listed.  CBC    Component Value Date/Time   WBC 6.1 06/08/2019 0828   RBC 3.03 (L) 06/08/2019 0828   HGB 8.5 (L) 06/08/2019 0828   HCT 28.9 (L) 06/08/2019 0828   PLT 293 06/08/2019 0828   MCV 95.4 06/08/2019 0828   MCH 28.1 06/08/2019 0828   MCHC 29.4 (L) 06/08/2019 0828   RDW 15.8 (H) 06/08/2019 0828   LYMPHSABS 0.9 06/08/2019 0828   MONOABS 0.5 06/08/2019 0828   EOSABS 0.1 06/08/2019 0828   BASOSABS 0.0 06/08/2019 0828   CMP Latest Ref Rng & Units 06/08/2019 06/01/2019 05/25/2019  Glucose 70 - 99 mg/dL 96 117(H) 103(H)  BUN 8 - 23 mg/dL _0 Creatinine 0.44 - 1.00 mg/dL 0.54 0.50 0.53  Sodium 135 - 145 mmol/L 141 141 141  Potassium 3.5 - 5.1 mmol/L 3.8 3.8 3.9  Chloride 98 - 111 mmol/L 106 106 104  CO2 22 - 32 mmol/L _1 Calcium 8.9 - 10.3 mg/dL 9.6 9.5 9.7  Total Protein 6.5 - 8.1 g/dL 6.8 7.0 6.8  Total Bilirubin 0.3 - 1.2 mg/dL 0.5 0.2(L) 0.4  Alkaline Phos 38 - 126 U/L 71 86 94  AST 15 -  41 U/L _0 ALT 0 - 44 U/L _1 DIAGNOSTIC IMAGING:  I have independently reviewed the scans and discussed with the patient.     ASSESSMENT & PLAN:    Malignant neoplasm of upper-outer quadrant of left breast in female, estrogen receptor positive ([place]) 1.  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 -4 cycles of dose dense AC from 02/01/2019 through 03/15/2019. -Weekly paclitaxel started on 03/30/2019.  Week 8 was on 06/01/2019. -She denied any numbness in the extremities.  We reviewed her labs. -She will proceed with her paclitaxel at regular dose of 80 mg per metered square. -I will see her back in 2 weeks for follow-up.  2.  Unprovoked pulmonary embolism: - CT scan on 05/13/2019 showed submassive PE with right heart strain by CT evidence.  This happened while she is receiving chemotherapy in the adjuvant setting while working. -She is taking Eliquis twice daily without any problems. -She has been off of oxygen since 05/30/2019 and maintaining her saturations.  3.  Family history: -Sister had metastatic breast cancer at age 87.  I have recommended genetic testing.  4.  Sleep problems: -She will use trazodone 50 mg at bedtime as needed.   Total time spent is 25 minutes with more than 50% of the time spent face-to-face discussing treatment plan, counseling and coordination of care.  Orders placed this encounter:  No orders of the defined types were placed in this encounter.     Derek Jack, MD [place] [phone]

## 2019-06-08 NOTE — Progress Notes (Signed)
Labs reviewed with MD. Proceed with treatment today.

## 2019-06-08 NOTE — Assessment & Plan Note (Signed)
1.  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 -4 cycles of dose dense AC from 02/01/2019 through 03/15/2019. -Weekly paclitaxel started on 03/30/2019.  Week 8 was on 06/01/2019. -She denied any numbness in the extremities.  We reviewed her labs. -She will proceed with her paclitaxel at regular dose of 80 mg per metered square. -I will see her back in 2 weeks for follow-up.  2.  Unprovoked pulmonary embolism: - CT scan on 05/13/2019 showed submassive PE with right heart strain by CT evidence.  This happened while she is receiving chemotherapy in the adjuvant setting while working. -She is taking Eliquis twice daily without any problems. -She has been off of oxygen since 05/30/2019 and maintaining her saturations.  3.  Family history: -Sister had metastatic breast cancer at age 84.  I have recommended genetic testing.  4.  Sleep problems: -She will use trazodone 50 mg at bedtime as needed.

## 2019-06-08 NOTE — Patient Instructions (Addendum)
[place] at [facility] Discharge Instructions  You were seen today by [name]. He went over your recent lab results. Continue weekly treatments. He will see you back in 2 weeks for labs and follow up.   Thank you for choosing [place] at [facility] to provide your oncology and hematology care.  To afford each patient quality time with our provider, please arrive at least 15 minutes before your scheduled appointment time.   If you have a lab appointment with the [facility] please come in thru the  Main Entrance and check in at the main information desk  You need to re-schedule your appointment should you arrive 10 or more minutes late.  We strive to give you quality time with our providers, and arriving late affects you and other patients whose appointments are after yours.  Also, if you no show three or more times for appointments you may be dismissed from the clinic at the providers discretion.     Again, thank you for choosing [facility].  Our hope is that these requests will decrease the amount of time that you wait before being seen by our physicians.       _____________________________________________________________  Should you have questions after your visit to [facility], please contact our office at [phone] between the hours of 8:00 a.m. and 4:30 p.m.  Voicemails left after 4:00 p.m. will not be returned until the following business day.  For prescription refill requests, have your pharmacy contact our office and allow 72 hours.    Cancer Center Support Programs:   > Cancer Support Group  2nd Tuesday of the month 1pm-2pm, Journey Room

## 2019-06-14 ENCOUNTER — Other Ambulatory Visit: Payer: Self-pay

## 2019-06-15 ENCOUNTER — Inpatient Hospital Stay (HOSPITAL_COMMUNITY): Payer: Medicaid - Out of State

## 2019-06-15 ENCOUNTER — Inpatient Hospital Stay (HOSPITAL_BASED_OUTPATIENT_CLINIC_OR_DEPARTMENT_OTHER): Payer: Medicaid - Out of State | Admitting: Hematology

## 2019-06-15 ENCOUNTER — Encounter (HOSPITAL_COMMUNITY): Payer: Self-pay | Admitting: *Deleted

## 2019-06-15 ENCOUNTER — Other Ambulatory Visit (HOSPITAL_COMMUNITY): Payer: Self-pay | Admitting: *Deleted

## 2019-06-15 ENCOUNTER — Encounter: Payer: Self-pay | Admitting: General Surgery

## 2019-06-15 VITALS — BP 135/87 | HR 88 | Temp 97.3°F | Resp 18

## 2019-06-15 DIAGNOSIS — Z170 Estrogen receptor positive status [ER+]: Secondary | ICD-10-CM | POA: Diagnosis not present

## 2019-06-15 DIAGNOSIS — C50412 Malignant neoplasm of upper-outer quadrant of left female breast: Secondary | ICD-10-CM | POA: Diagnosis not present

## 2019-06-15 DIAGNOSIS — Z5111 Encounter for antineoplastic chemotherapy: Secondary | ICD-10-CM | POA: Diagnosis not present

## 2019-06-15 LAB — COMPREHENSIVE METABOLIC PANEL
ALT: 22 U/L (ref 0–44)
AST: 18 U/L (ref 15–41)
Albumin: 4.1 g/dL (ref 3.5–5.0)
Alkaline Phosphatase: 74 U/L (ref 38–126)
Anion gap: 9 (ref 5–15)
BUN: 9 mg/dL (ref 8–23)
CO2: 26 mmol/L (ref 22–32)
Calcium: 9.7 mg/dL (ref 8.9–10.3)
Chloride: 105 mmol/L (ref 98–111)
Creatinine, Ser: 0.55 mg/dL (ref 0.44–1.00)
GFR calc Af Amer: >60 mL/min (ref >60)
GFR calc non Af Amer: >60 mL/min (ref >60)
Glucose, Bld: 134 mg/dL — ABNORMAL HIGH (ref 70–99)
Potassium: 3.9 mmol/L (ref 3.5–5.1)
Sodium: 140 mmol/L (ref 135–145)
Total Bilirubin: 0.6 mg/dL (ref 0.3–1.2)
Total Protein: 7.3 g/dL (ref 6.5–8.1)

## 2019-06-15 LAB — CBC WITH DIFFERENTIAL/PLATELET
Abs Immature Granulocytes: 0.02 10*3/uL (ref 0.00–0.07)
Basophils Absolute: 0 10*3/uL (ref 0.0–0.1)
Basophils Relative: 0 %
Eosinophils Absolute: 0.1 10*3/uL (ref 0.0–0.5)
Eosinophils Relative: 1 %
HCT: 32.1 % — ABNORMAL LOW (ref 36.0–46.0)
Hemoglobin: 9.6 g/dL — ABNORMAL LOW (ref 12.0–15.0)
Immature Granulocytes: 0 %
Lymphocytes Relative: 18 %
Lymphs Abs: 0.9 10*3/uL (ref 0.7–4.0)
MCH: 28.2 pg (ref 26.0–34.0)
MCHC: 29.9 g/dL — ABNORMAL LOW (ref 30.0–36.0)
MCV: 94.4 fL (ref 80.0–100.0)
Monocytes Absolute: 0.5 10*3/uL (ref 0.1–1.0)
Monocytes Relative: 10 %
Neutro Abs: 3.5 10*3/uL (ref 1.7–7.7)
Neutrophils Relative %: 71 %
Platelets: 326 10*3/uL (ref 150–400)
RBC: 3.4 MIL/uL — ABNORMAL LOW (ref 3.87–5.11)
RDW: 15.9 % — ABNORMAL HIGH (ref 11.5–15.5)
WBC: 5 10*3/uL (ref 4.0–10.5)
nRBC: 0 % (ref 0.0–0.2)

## 2019-06-15 MED ORDER — DIPHENHYDRAMINE HCL 50 MG/ML IJ SOLN
25.0000 mg | Freq: Once | INTRAMUSCULAR | Status: AC
  Administered 2019-06-15: 25 mg via INTRAVENOUS
  Filled 2019-06-15: qty 1

## 2019-06-15 MED ORDER — SODIUM CHLORIDE 0.9 % IV SOLN
Freq: Once | INTRAVENOUS | Status: AC
  Administered 2019-06-15: 10:00:00 via INTRAVENOUS

## 2019-06-15 MED ORDER — SODIUM CHLORIDE 0.9 % IV SOLN
80.0000 mg/m2 | Freq: Once | INTRAVENOUS | Status: AC
  Administered 2019-06-15: 162 mg via INTRAVENOUS
  Filled 2019-06-15: qty 27

## 2019-06-15 MED ORDER — APIXABAN 5 MG PO TABS: 5.0000 mg | ORAL_TABLET | Freq: Two times a day (BID) | ORAL | 3 refills | Status: DC

## 2019-06-15 MED ORDER — FAMOTIDINE IN NACL 20-0.9 MG/50ML-% IV SOLN
20.0000 mg | Freq: Once | INTRAVENOUS | Status: AC
  Administered 2019-06-15: 10:00:00 20 mg via INTRAVENOUS
  Filled 2019-06-15: qty 50

## 2019-06-15 MED ORDER — SODIUM CHLORIDE 0.9 % IV SOLN
20.0000 mg | Freq: Once | INTRAVENOUS | Status: AC
  Administered 2019-06-15: 20 mg via INTRAVENOUS
  Filled 2019-06-15: qty 20

## 2019-06-15 MED ORDER — HEPARIN SOD (PORK) LOCK FLUSH 100 UNIT/ML IV SOLN
500.0000 [IU] | Freq: Once | INTRAVENOUS | Status: AC | PRN
  Administered 2019-06-15: 500 [IU]

## 2019-06-15 MED ORDER — SODIUM CHLORIDE 0.9% FLUSH
10.0000 mL | INTRAVENOUS | Status: DC | PRN
  Administered 2019-06-15 (×2): 10 mL
  Filled 2019-06-15 (×2): qty 10

## 2019-06-15 NOTE — Progress Notes (Signed)
Patient assistance form filled out for patient and faxed along with prescription to [facility] for possible free drug due to inability of patient to afford drug costs.

## 2019-06-15 NOTE — Patient Instructions (Signed)
[facility] at [facility] Discharge Instructions  Labs drawn from portacath today   Thank you for choosing [facility] at [facility] to provide your oncology and hematology care.  To afford each patient quality time with our provider, please arrive at least 15 minutes before your scheduled appointment time.   If you have a lab appointment with the Cancer Center please come in thru the Main Entrance and check in at the main information desk.  You need to re-schedule your appointment should you arrive 10 or more minutes late.  We strive to give you quality time with our providers, and arriving late affects you and other patients whose appointments are after yours.  Also, if you no show three or more times for appointments you may be dismissed from the clinic at the providers discretion.     Again, thank you for choosing [facility].  Our hope is that these requests will decrease the amount of time that you wait before being seen by our physicians.       _____________________________________________________________  Should you have questions after your visit to [facility], please contact our office at [phone] between the hours of 8:00 a.m. and 4:30 p.m.  Voicemails left after 4:00 p.m. will not be returned until the following business day.  For prescription refill requests, have your pharmacy contact our office and allow 72 hours.    Due to Covid, you will need to wear a mask upon entering the hospital. If you do not have a mask, a mask will be given to you at the Main Entrance upon arrival. For doctor visits, patients may have 1 support person with them. For treatment visits, patients can not have anyone with them due to social distancing guidelines and our immunocompromised population.     

## 2019-06-15 NOTE — Progress Notes (Signed)
Labs reviewed with kim nester NP today. Proceed as planned.

## 2019-06-15 NOTE — Patient Instructions (Signed)
[facility] Discharge Instructions for Patients Receiving Chemotherapy  Today you received the following chemotherapy agents   To help prevent nausea and vomiting after your treatment, we encourage you to take your nausea medication   If you develop nausea and vomiting that is not controlled by your nausea medication, call the clinic.   BELOW ARE SYMPTOMS THAT SHOULD BE REPORTED IMMEDIATELY:  *FEVER GREATER THAN 100.5 F  *CHILLS WITH OR WITHOUT FEVER  NAUSEA AND VOMITING THAT IS NOT CONTROLLED WITH YOUR NAUSEA MEDICATION  *UNUSUAL SHORTNESS OF BREATH  *UNUSUAL BRUISING OR BLEEDING  TENDERNESS IN MOUTH AND THROAT WITH OR WITHOUT PRESENCE OF ULCERS  *URINARY PROBLEMS  *BOWEL PROBLEMS  UNUSUAL RASH Items with * indicate a potential emergency and should be followed up as soon as possible.  Feel free to call the clinic should you have any questions or concerns. The clinic phone number is [phone].  Please show the CHEMO ALERT CARD at check-in to the Emergency Department and triage nurse.   

## 2019-06-15 NOTE — Progress Notes (Signed)
Bridget Richardson, [name] [address]   CLINIC:  Medical Oncology/Hematology  PCP:  [name], MD [address] [place] [address] [phone]   REASON FOR VISIT:  Follow-up for Breast Cancer  CURRENT THERAPY: Paclitaxel  BRIEF ONCOLOGIC HISTORY:  Oncology History  Malignant neoplasm of upper-outer quadrant of left breast in female, estrogen receptor positive ([facility])  01/04/2019 Initial Diagnosis   Malignant neoplasm of upper-outer quadrant of left breast in female, estrogen receptor positive ([facility])   01/19/2019 Cancer Staging   Staging form: Breast, AJCC 8th Edition - Pathologic stage from 01/19/2019: Stage IIA (pT2, pN1, cM0, G3, ER+, PR+, HER2-) - Signed by Derek Jack, MD on 01/19/2019   02/01/2019 -  Chemotherapy   The patient had DOXOrubicin (ADRIAMYCIN) chemo injection 120 mg, 60 mg/m2 = 120 mg, Intravenous,  Once, 4 of 4 cycles Administration: 120 mg (02/01/2019), 120 mg (02/15/2019), 120 mg (03/02/2019), 120 mg (03/15/2019) palonosetron (ALOXI) injection 0.25 mg, 0.25 mg, Intravenous,  Once, 4 of 4 cycles Administration: 0.25 mg (02/01/2019), 0.25 mg (02/15/2019), 0.25 mg (03/02/2019), 0.25 mg (03/15/2019) pegfilgrastim (NEULASTA ONPRO KIT) injection 6 mg, 6 mg, Subcutaneous, Once, 4 of 4 cycles Administration: 6 mg (02/01/2019), 6 mg (02/15/2019), 6 mg (03/02/2019), 6 mg (03/15/2019) cyclophosphamide (CYTOXAN) 1,200 mg in sodium chloride 0.9 % 250 mL chemo infusion, 600 mg/m2 = 1,200 mg, Intravenous,  Once, 4 of 4 cycles Administration: 1,200 mg (02/01/2019), 1,200 mg (02/15/2019), 1,200 mg (03/02/2019), 1,200 mg (03/15/2019) PACLitaxel (TAXOL) 162 mg in sodium chloride 0.9 % 250 mL chemo infusion (</= 47m/m2), 80 mg/m2 = 162 mg, Intravenous,  Once, 10 of 12 cycles Administration: 162 mg (03/30/2019), 162 mg (04/06/2019), 162 mg (04/13/2019), 162 mg (04/20/2019), 162 mg (04/27/2019), 162 mg (05/18/2019), 162 mg (05/25/2019), 162 mg (06/01/2019), 162  mg (06/08/2019), 162 mg (06/15/2019) fosaprepitant (EMEND) 150 mg, dexamethasone (DECADRON) 12 mg in sodium chloride 0.9 % 145 mL IVPB, , Intravenous,  Once, 4 of 4 cycles Administration:  (02/01/2019),  (02/15/2019),  (03/02/2019),  (03/15/2019)  for chemotherapy treatment.       CANCER STAGING: Cancer Staging Malignant neoplasm of upper-outer quadrant of left breast in female, estrogen receptor positive ([place] Staging form: Breast, AJCC 8th Edition - Clinical: No stage assigned - Unsigned - Pathologic stage from 01/19/2019: Stage IIA (pT2, pN1, cM0, G3, ER+, PR+, HER2-) - Signed by KDerek Jack MD on 01/19/2019    INTERVAL HISTORY:  [name]. female presents today for follow-up.  She reports overall doing well.  Patient states she did have an episode of tachycardia yesterday with a heart rate in the 160s.  Patient states she was sitting in her living room when this happened.  Patient keeps a pulse ox on for the majority of the day she watch her heart and as well as her O2 saturation.  Patient called 911 she was evaluated by paramedics at that time EKG was normal and heart rate have returned to normal.  No further episodes since.  Patient states she feels well she denies any chest pain or shortness of breath no lightheadedness or dizziness.  She denies any nausea, vomiting, or diarrhea.  She denies any significant fatigue.  She denies any neuropathy.  She is scheduled for paclitaxel today and states she is ready to proceed with treatment.  REVIEW OF SYSTEMS:  Review of Systems  Constitutional: Negative.   HENT:  Negative.   Eyes: Negative.   Respiratory: Negative.   Cardiovascular: Negative.  Gastrointestinal: Negative.   Endocrine: Negative.   Genitourinary: Negative.    Musculoskeletal: Negative.   Skin: Negative.   Neurological: Negative.   Hematological: Negative.   Psychiatric/Behavioral: Negative.      PAST MEDICAL/SURGICAL HISTORY:  Past Medical History:   Diagnosis Date  . Cancer [facility])    stage 2 total mastctomy left breast dx march 2020  . Coronary artery disease   . Diabetes mellitus without complication ([place])   . Hypertension    Past Surgical History:  Procedure Laterality Date  . ANKLE SURGERY Right 2009  . MASTECTOMY MODIFIED RADICAL Left 01/04/2019   Procedure: MASTECTOMY MODIFIED RADICAL;  Surgeon: Aviva Signs, MD;  Location: [facility];  Service: General;  Laterality: Left;  . PORTACATH PLACEMENT Right 01/30/2019   Procedure: INSERTION PORT-A-CATH;  Surgeon: Aviva Signs, MD;  Location: [facility];  Service: General;  Laterality: Right;  . WRIST SURGERY Right 1997     SOCIAL HISTORY:  Social History   Socioeconomic History  . Marital status: Divorced    Spouse name: Not on file  . Number of children: Not on file  . Years of education: Not on file  . Highest education level: Not on file  Occupational History  . Not on file  Social Needs  . Financial resource strain: Not on file  . Food insecurity    Worry: Not on file    Inability: Not on file  . Transportation needs    Medical: Not on file    Non-medical: Not on file  Tobacco Use  . Smoking status: Former Smoker    Packs/day: 0.25    Years: 2.00    Pack years: 0.50    Types: Cigarettes    Quit date: 1983    Years since quitting: 37.8  . Smokeless tobacco: Never Used  Substance and Sexual Activity  . Alcohol use: Never    Frequency: Never  . Drug use: Never  . Sexual activity: Not Currently  Lifestyle  . Physical activity    Days per week: Not on file    Minutes per session: Not on file  . Stress: Not on file  Relationships  . Social Herbalist on phone: Not on file    Gets together: Not on file    Attends religious service: Not on file    Active member of club or organization: Not on file    Attends meetings of clubs or organizations: Not on file    Relationship status: Not on file  . Intimate partner violence    Fear of current or ex  partner: Not on file    Emotionally abused: Not on file    Physically abused: Not on file    Forced sexual activity: Not on file  Other Topics Concern  . Not on file  Social History Narrative  . Not on file    FAMILY HISTORY:  No family history on file.  CURRENT MEDICATIONS:  Outpatient Encounter Medications as of 06/15/2019  Medication Sig  . atorvastatin (LIPITOR) 40 MG tablet Take 40 mg by mouth at bedtime.   . carvedilol (COREG) 6.25 MG tablet Take 1 tablet (6.25 mg total) by mouth 2 (two) times daily with a meal.  . CYCLOPHOSPHAMIDE IV Inject into the vein every 14 (fourteen) days.  . enalapril (VASOTEC) 20 MG tablet Take 20 mg by mouth 2 (two) times daily.  . ferrous sulfate 325 (65 FE) MG tablet Take 325 mg by mouth daily with breakfast.  . lidocaine-prilocaine (  EMLA) cream Apply small amount to port a cath site and cover with plastic wrap 1 hour prior to chemotherapy appointments  . metFORMIN (GLUCOPHAGE) 850 MG tablet Take 850 mg by mouth daily with breakfast.   . PACLITAXEL IV Inject into the vein once a week.  . pantoprazole (PROTONIX) 40 MG tablet Take 1 tablet (40 mg total) by mouth daily.  . Vitamin D, Ergocalciferol, (DRISDOL) 1.25 MG (50000 UT) CAPS capsule TAKE 1 CAPSULE ORALLY ONCE A WEEK FOR 4 WEEKS THEN ONCE MONTHLY  . [DISCONTINUED] apixaban (ELIQUIS) 5 MG TABS tablet Take 1 tablet (5 mg total) by mouth 2 (two) times daily. Start this prescription around 06/06/2019 after you complete the initial starter pack of Eliquis/Apixaban  . acetaminophen (TYLENOL) 325 MG tablet Take 2 tablets (650 mg total) by mouth every 6 (six) hours as needed for mild pain (or Fever >/= 101). (Patient not taking: Reported on 05/25/2019)  . traZODone (DESYREL) 100 MG tablet TAKE 1 TABLET BY MOUTH EVERY DAY AT BEDTIME AS NEEDED (Patient not taking: Reported on 05/25/2019)   No facility-administered encounter medications on file as of 06/15/2019.     ALLERGIES:  Allergies  Allergen  Reactions  . Other     No Meat- Patient is vegetarian     PHYSICAL EXAM:  ECOG Performance status: 1  Vitals:   06/15/19 0857  BP: (!) 152/82  Pulse: 78  Resp: 18  Temp: 97.6 F (36.4 C)  SpO2: 100%   Filed Weights   06/15/19 0857  Weight: 170 lb (77.1 kg)    Physical Exam Constitutional:      Appearance: Normal appearance. She is obese.  HENT:     Head: Normocephalic.     Right Ear: External ear normal.     Left Ear: External ear normal.     Nose: Nose normal.     Mouth/Throat:     Mouth: Mucous membranes are moist.     Pharynx: Oropharynx is clear.  Eyes:     Conjunctiva/sclera: Conjunctivae normal.  Neck:     Musculoskeletal: Normal range of motion.  Cardiovascular:     Rate and Rhythm: Normal rate and regular rhythm.     Pulses: Normal pulses.     Heart sounds: Normal heart sounds.  Pulmonary:     Effort: Pulmonary effort is normal.     Breath sounds: Normal breath sounds.  Abdominal:     General: Bowel sounds are normal.  Musculoskeletal: Normal range of motion.  Skin:    General: Skin is warm.  Neurological:     General: No focal deficit present.     Mental Status: She is alert. Mental status is at baseline. She is disoriented.  Psychiatric:        Mood and Affect: Mood normal.        Behavior: Behavior normal.        Thought Content: Thought content normal.        Judgment: Judgment normal.      LABORATORY DATA:  I have reviewed the labs as listed.  CBC    Component Value Date/Time   WBC 5.0 06/15/2019 0842   RBC 3.40 (L) 06/15/2019 0842   HGB 9.6 (L) 06/15/2019 0842   HCT 32.1 (L) 06/15/2019 0842   PLT 326 06/15/2019 0842   MCV 94.4 06/15/2019 0842   MCH 28.2 06/15/2019 0842   MCHC 29.9 (L) 06/15/2019 0842   RDW 15.9 (H) 06/15/2019 0842   LYMPHSABS 0.9 06/15/2019 0842   MONOABS 0.5  06/15/2019 0842   EOSABS 0.1 06/15/2019 0842   BASOSABS 0.0 06/15/2019 0842   CMP Latest Ref Rng & Units 06/15/2019 06/08/2019 06/01/2019  Glucose 70  - 99 mg/dL 134(H) 96 117(H)  BUN 8 - 23 mg/dL 9 12 9   Creatinine 0.44 - 1.00 mg/dL 0.55 0.54 0.50  Sodium 135 - 145 mmol/L 140 141 141  Potassium 3.5 - 5.1 mmol/L 3.9 3.8 3.8  Chloride 98 - 111 mmol/L 105 106 106  CO2 22 - 32 mmol/L 26 26 25   Calcium 8.9 - 10.3 mg/dL 9.7 9.6 9.5  Total Protein 6.5 - 8.1 g/dL 7.3 6.8 7.0  Total Bilirubin 0.3 - 1.2 mg/dL 0.6 0.5 0.2(L)  Alkaline Phos 38 - 126 U/L 74 71 86  AST 15 - 41 U/L 18 16 16   ALT 0 - 44 U/L 22 23 20          ASSESSMENT & PLAN:   Malignant neoplasm of upper-outer quadrant of left breast in female, estrogen receptor positive ([facility]) 1.  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 -4 cycles of dose dense AC from 02/01/2019 through 03/15/2019. -Weekly paclitaxel started on 03/30/2019.  Week 8 was on 06/01/2019. -She denied any numbness in the extremities.  We reviewed her labs. -She will proceed with her paclitaxel at regular dose of 80 mg per metered square. -She is clinical stable. Labs are acceptable. She will proceed with cycle 10 today. -Return to clinic in 2 weeks.  2.  Unprovoked pulmonary embolism: - CT scan on 05/13/2019 showed submassive PE with right heart strain by CT evidence.  This happened while she is receiving chemotherapy in the adjuvant setting while working. -She is taking Eliquis twice daily without any problems. -She has been off of oxygen since 05/30/2019 and maintaining her saturations.  3.  Family history: -Sister had metastatic breast cancer at age 21.  I have recommended genetic testing.  4.  Sleep problems: -She will use trazodone 50 mg at bedtime as needed.      Orders placed this encounter:  Orders Placed This Encounter  Procedures  . CBC with Differential  . Comprehensive metabolic panel       [place] [phone]

## 2019-06-15 NOTE — Assessment & Plan Note (Signed)
1.  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 -4 cycles of dose dense AC from 02/01/2019 through 03/15/2019. -Weekly paclitaxel started on 03/30/2019.  Week 8 was on 06/01/2019. -She denied any numbness in the extremities.  We reviewed her labs. -She will proceed with her paclitaxel at regular dose of 80 mg per metered square. -She is clinical stable. Labs are acceptable. She will proceed with cycle 10 today. -Return to clinic in 2 weeks.  2.  Unprovoked pulmonary embolism: - CT scan on 05/13/2019 showed submassive PE with right heart strain by CT evidence.  This happened while she is receiving chemotherapy in the adjuvant setting while working. -She is taking Eliquis twice daily without any problems. -She has been off of oxygen since 05/30/2019 and maintaining her saturations.  3.  Family history: -Sister had metastatic breast cancer at age 66.  I have recommended genetic testing.  4.  Sleep problems: -She will use trazodone 50 mg at bedtime as needed.

## 2019-06-17 ENCOUNTER — Encounter (HOSPITAL_COMMUNITY): Payer: Self-pay | Admitting: *Deleted

## 2019-06-19 ENCOUNTER — Telehealth (HOSPITAL_COMMUNITY): Payer: Self-pay | Admitting: Hematology

## 2019-06-21 ENCOUNTER — Other Ambulatory Visit: Payer: Self-pay

## 2019-06-22 ENCOUNTER — Other Ambulatory Visit: Payer: Self-pay

## 2019-06-22 ENCOUNTER — Encounter (HOSPITAL_COMMUNITY): Payer: Self-pay | Admitting: Hematology

## 2019-06-22 ENCOUNTER — Encounter (HOSPITAL_COMMUNITY): Payer: Self-pay | Admitting: Lab

## 2019-06-22 ENCOUNTER — Inpatient Hospital Stay (HOSPITAL_COMMUNITY): Payer: Medicaid - Out of State

## 2019-06-22 ENCOUNTER — Inpatient Hospital Stay (HOSPITAL_BASED_OUTPATIENT_CLINIC_OR_DEPARTMENT_OTHER): Payer: Medicaid - Out of State | Admitting: Hematology

## 2019-06-22 ENCOUNTER — Encounter (HOSPITAL_COMMUNITY): Payer: Self-pay | Admitting: *Deleted

## 2019-06-22 VITALS — BP 142/77 | HR 77 | Temp 96.8°F | Resp 18

## 2019-06-22 DIAGNOSIS — Z170 Estrogen receptor positive status [ER+]: Secondary | ICD-10-CM

## 2019-06-22 DIAGNOSIS — C50412 Malignant neoplasm of upper-outer quadrant of left female breast: Secondary | ICD-10-CM

## 2019-06-22 DIAGNOSIS — Z5111 Encounter for antineoplastic chemotherapy: Secondary | ICD-10-CM | POA: Diagnosis not present

## 2019-06-22 LAB — CBC WITH DIFFERENTIAL/PLATELET
Abs Immature Granulocytes: 0.03 10*3/uL (ref 0.00–0.07)
Basophils Absolute: 0 10*3/uL (ref 0.0–0.1)
Basophils Relative: 1 %
Eosinophils Absolute: 0.1 10*3/uL (ref 0.0–0.5)
Eosinophils Relative: 1 %
HCT: 29.7 % — ABNORMAL LOW (ref 36.0–46.0)
Hemoglobin: 9.1 g/dL — ABNORMAL LOW (ref 12.0–15.0)
Immature Granulocytes: 1 %
Lymphocytes Relative: 19 %
Lymphs Abs: 0.9 10*3/uL (ref 0.7–4.0)
MCH: 28.5 pg (ref 26.0–34.0)
MCHC: 30.6 g/dL (ref 30.0–36.0)
MCV: 93.1 fL (ref 80.0–100.0)
Monocytes Absolute: 0.5 10*3/uL (ref 0.1–1.0)
Monocytes Relative: 10 %
Neutro Abs: 3.5 10*3/uL (ref 1.7–7.7)
Neutrophils Relative %: 68 %
Platelets: 275 10*3/uL (ref 150–400)
RBC: 3.19 MIL/uL — ABNORMAL LOW (ref 3.87–5.11)
RDW: 15.6 % — ABNORMAL HIGH (ref 11.5–15.5)
WBC: 5 10*3/uL (ref 4.0–10.5)
nRBC: 0 % (ref 0.0–0.2)

## 2019-06-22 LAB — COMPREHENSIVE METABOLIC PANEL
ALT: 22 U/L (ref 0–44)
AST: 16 U/L (ref 15–41)
Albumin: 3.7 g/dL (ref 3.5–5.0)
Alkaline Phosphatase: 63 U/L (ref 38–126)
Anion gap: 11 (ref 5–15)
BUN: 8 mg/dL (ref 8–23)
CO2: 25 mmol/L (ref 22–32)
Calcium: 9.6 mg/dL (ref 8.9–10.3)
Chloride: 105 mmol/L (ref 98–111)
Creatinine, Ser: 0.62 mg/dL (ref 0.44–1.00)
GFR calc Af Amer: >60 mL/min (ref >60)
GFR calc non Af Amer: >60 mL/min (ref >60)
Glucose, Bld: 113 mg/dL — ABNORMAL HIGH (ref 70–99)
Potassium: 3.3 mmol/L — ABNORMAL LOW (ref 3.5–5.1)
Sodium: 141 mmol/L (ref 135–145)
Total Bilirubin: 0.3 mg/dL (ref 0.3–1.2)
Total Protein: 7 g/dL (ref 6.5–8.1)

## 2019-06-22 MED ORDER — HEPARIN SOD (PORK) LOCK FLUSH 100 UNIT/ML IV SOLN
500.0000 [IU] | Freq: Once | INTRAVENOUS | Status: AC | PRN
  Administered 2019-06-22: 500 [IU]

## 2019-06-22 MED ORDER — SODIUM CHLORIDE 0.9 % IV SOLN
20.0000 mg | Freq: Once | INTRAVENOUS | Status: AC
  Administered 2019-06-22: 20 mg via INTRAVENOUS
  Filled 2019-06-22: qty 20

## 2019-06-22 MED ORDER — SODIUM CHLORIDE 0.9% FLUSH
10.0000 mL | INTRAVENOUS | Status: DC | PRN
  Administered 2019-06-22: 10 mL
  Filled 2019-06-22: qty 10

## 2019-06-22 MED ORDER — SODIUM CHLORIDE 0.9 % IV SOLN
80.0000 mg/m2 | Freq: Once | INTRAVENOUS | Status: AC
  Administered 2019-06-22: 162 mg via INTRAVENOUS
  Filled 2019-06-22: qty 27

## 2019-06-22 MED ORDER — DIPHENHYDRAMINE HCL 50 MG/ML IJ SOLN
50.0000 mg | Freq: Once | INTRAMUSCULAR | Status: AC
  Administered 2019-06-22: 50 mg via INTRAVENOUS
  Filled 2019-06-22: qty 1

## 2019-06-22 MED ORDER — POTASSIUM CHLORIDE CRYS ER 20 MEQ PO TBCR
20.0000 meq | EXTENDED_RELEASE_TABLET | Freq: Once | ORAL | Status: AC
  Administered 2019-06-22: 20 meq via ORAL
  Filled 2019-06-22: qty 1

## 2019-06-22 MED ORDER — FAMOTIDINE IN NACL 20-0.9 MG/50ML-% IV SOLN
20.0000 mg | Freq: Once | INTRAVENOUS | Status: AC
  Administered 2019-06-22: 09:00:00 20 mg via INTRAVENOUS
  Filled 2019-06-22: qty 50

## 2019-06-22 MED ORDER — SODIUM CHLORIDE 0.9 % IV SOLN
Freq: Once | INTRAVENOUS | Status: AC
  Administered 2019-06-22: 09:00:00 via INTRAVENOUS

## 2019-06-22 NOTE — Patient Instructions (Addendum)
[place] at [facility] Discharge Instructions  You were seen today by [name]. He went over your recent lab results. He will refer you to [facility] in [place] for radiation. You will have your last treatment next week!!! He will see you back in 3 week for labs and follow up.   Thank you for choosing [place] at [facility] to provide your oncology and hematology care.  To afford each patient quality time with our provider, please arrive at least 15 minutes before your scheduled appointment time.   If you have a lab appointment with the [facility] please come in thru the  Main Entrance and check in at the main information desk  You need to re-schedule your appointment should you arrive 10 or more minutes late.  We strive to give you quality time with our providers, and arriving late affects you and other patients whose appointments are after yours.  Also, if you no show three or more times for appointments you may be dismissed from the clinic at the providers discretion.     Again, thank you for choosing [facility].  Our hope is that these requests will decrease the amount of time that you wait before being seen by our physicians.       _____________________________________________________________  Should you have questions after your visit to [facility], please contact our office at [phone] between the hours of 8:00 a.m. and 4:30 p.m.  Voicemails left after 4:00 p.m. will not be returned until the following business day.  For prescription refill requests, have your pharmacy contact our office and allow 72 hours.    Cancer Center Support Programs:   > Cancer Support Group  2nd Tuesday of the month 1pm-2pm, Journey Room

## 2019-06-22 NOTE — Progress Notes (Signed)
Labs reviewed with MD today. Potassium 3.3. MD noted. Proceed with treatment per MD.  .Treatment given per orders. Patient tolerated it well without problems. Vitals stable and discharged home from clinic ambulatory. Follow up as scheduled.

## 2019-06-22 NOTE — Progress Notes (Signed)
[place], [place] [address]   CLINIC:  Medical Oncology/Hematology  PCP:  [name], MD [address] [place] [address] [phone]   REASON FOR VISIT: Follow-up for left breast cancer.  CURRENT THERAPY: Weekly paclitaxel.  BRIEF ONCOLOGIC HISTORY:  Oncology History  Malignant neoplasm of upper-outer quadrant of left breast in female, estrogen receptor positive ([place])  01/04/2019 Initial Diagnosis   Malignant neoplasm of upper-outer quadrant of left breast in female, estrogen receptor positive ([place])   01/19/2019 Cancer Staging   Staging form: Breast, AJCC 8th Edition - Pathologic stage from 01/19/2019: Stage IIA (pT2, pN1, cM0, G3, ER+, PR+, HER2-) - Signed by Derek Jack, MD on 01/19/2019   02/01/2019 -  Chemotherapy   The patient had DOXOrubicin (ADRIAMYCIN) chemo injection 120 mg, 60 mg/m2 = 120 mg, Intravenous,  Once, 4 of 4 cycles Administration: 120 mg (02/01/2019), 120 mg (02/15/2019), 120 mg (03/02/2019), 120 mg (03/15/2019) palonosetron (ALOXI) injection 0.25 mg, 0.25 mg, Intravenous,  Once, 4 of 4 cycles Administration: 0.25 mg (02/01/2019), 0.25 mg (02/15/2019), 0.25 mg (03/02/2019), 0.25 mg (03/15/2019) pegfilgrastim (NEULASTA ONPRO KIT) injection 6 mg, 6 mg, Subcutaneous, Once, 4 of 4 cycles Administration: 6 mg (02/01/2019), 6 mg (02/15/2019), 6 mg (03/02/2019), 6 mg (03/15/2019) cyclophosphamide (CYTOXAN) 1,200 mg in sodium chloride 0.9 % 250 mL chemo infusion, 600 mg/m2 = 1,200 mg, Intravenous,  Once, 4 of 4 cycles Administration: 1,200 mg (02/01/2019), 1,200 mg (02/15/2019), 1,200 mg (03/02/2019), 1,200 mg (03/15/2019) PACLitaxel (TAXOL) 162 mg in sodium chloride 0.9 % 250 mL chemo infusion (</= 63m/m2), 80 mg/m2 = 162 mg, Intravenous,  Once, 11 of 12 cycles Administration: 162 mg (03/30/2019), 162 mg (04/06/2019), 162 mg (04/13/2019), 162 mg (04/20/2019), 162 mg (04/27/2019), 162 mg (05/18/2019), 162 mg (05/25/2019), 162 mg  (06/01/2019), 162 mg (06/08/2019), 162 mg (06/15/2019), 162 mg (06/22/2019) fosaprepitant (EMEND) 150 mg, dexamethasone (DECADRON) 12 mg in sodium chloride 0.9 % 145 mL IVPB, , Intravenous,  Once, 4 of 4 cycles Administration:  (02/01/2019),  (02/15/2019),  (03/02/2019),  (03/15/2019)  for chemotherapy treatment.       CANCER STAGING: Cancer Staging Malignant neoplasm of upper-outer quadrant of left breast in female, estrogen receptor positive ([place] Staging form: Breast, AJCC 8th Edition - Clinical: No stage assigned - Unsigned - Pathologic stage from 01/19/2019: Stage IIA (pT2, pN1, cM0, G3, ER+, PR+, HER2-) - Signed by KDerek Jack MD on 01/19/2019    INTERVAL HISTORY:  Ms. [id number]. female seen for follow-up and toxicity assessment for week 11 of paclitaxel.  Last treatment was on 06/15/2019.  Denied any nausea, vomiting, diarrhea or constipation.  Denies any tingling or numbness in extremities.  Appetite and energy levels are 100%.  No pain is reported.  She had one episode of left nostril bleed.  She is continuing Eliquis twice daily.  She has not required any oxygen since 05/30/2019.  She had an episode of palpitations and called EMS.  By the time EMS arrived, her palpitations subsided.   REVIEW OF SYSTEMS:  Review of Systems  All other systems reviewed and are negative.    PAST MEDICAL/SURGICAL HISTORY:  Past Medical History:  Diagnosis Date  . Cancer ([facility]    stage 2 total mastctomy left breast dx march 2020  . Coronary artery disease   . Diabetes mellitus without complication ([place]   . Hypertension    Past Surgical History:  Procedure Laterality Date  . ANKLE SURGERY Right 2009  . MASTECTOMY  MODIFIED RADICAL Left 01/04/2019   Procedure: MASTECTOMY MODIFIED RADICAL;  Surgeon: Aviva Signs, MD;  Location: [facility];  Service: General;  Laterality: Left;  . PORTACATH PLACEMENT Right 01/30/2019   Procedure: INSERTION PORT-A-CATH;  Surgeon: Aviva Signs, MD;  Location:  [facility];  Service: General;  Laterality: Right;  . WRIST SURGERY Right 1997     SOCIAL HISTORY:  Social History   Socioeconomic History  . Marital status: Divorced    Spouse name: Not on file  . Number of children: Not on file  . Years of education: Not on file  . Highest education level: Not on file  Occupational History  . Not on file  Social Needs  . Financial resource strain: Not on file  . Food insecurity    Worry: Not on file    Inability: Not on file  . Transportation needs    Medical: Not on file    Non-medical: Not on file  Tobacco Use  . Smoking status: Former Smoker    Packs/day: 0.25    Years: 2.00    Pack years: 0.50    Types: Cigarettes    Quit date: 1983    Years since quitting: 37.8  . Smokeless tobacco: Never Used  Substance and Sexual Activity  . Alcohol use: Never    Frequency: Never  . Drug use: Never  . Sexual activity: Not Currently  Lifestyle  . Physical activity    Days per week: Not on file    Minutes per session: Not on file  . Stress: Not on file  Relationships  . Social Herbalist on phone: Not on file    Gets together: Not on file    Attends religious service: Not on file    Active member of club or organization: Not on file    Attends meetings of clubs or organizations: Not on file    Relationship status: Not on file  . Intimate partner violence    Fear of current or ex partner: Not on file    Emotionally abused: Not on file    Physically abused: Not on file    Forced sexual activity: Not on file  Other Topics Concern  . Not on file  Social History Narrative  . Not on file    FAMILY HISTORY:  History reviewed. No pertinent family history.  CURRENT MEDICATIONS:  Outpatient Encounter Medications as of 06/22/2019  Medication Sig  . apixaban (ELIQUIS) 5 MG TABS tablet Take 1 tablet (5 mg total) by mouth 2 (two) times daily.  Marland Kitchen atorvastatin (LIPITOR) 40 MG tablet Take 40 mg by mouth at bedtime.   . carvedilol  (COREG) 6.25 MG tablet Take 1 tablet (6.25 mg total) by mouth 2 (two) times daily with a meal.  . CYCLOPHOSPHAMIDE IV Inject into the vein every 14 (fourteen) days.  . enalapril (VASOTEC) 20 MG tablet Take 20 mg by mouth 2 (two) times daily.  . ferrous sulfate 325 (65 FE) MG tablet Take 325 mg by mouth daily with breakfast.  . metFORMIN (GLUCOPHAGE) 850 MG tablet Take 850 mg by mouth daily with breakfast.   . PACLITAXEL IV Inject into the vein once a week.  . pantoprazole (PROTONIX) 40 MG tablet Take 1 tablet (40 mg total) by mouth daily.  . Vitamin D, Ergocalciferol, (DRISDOL) 1.25 MG (50000 UT) CAPS capsule TAKE 1 CAPSULE ORALLY ONCE A WEEK FOR 4 WEEKS THEN ONCE MONTHLY  . acetaminophen (TYLENOL) 325 MG tablet Take 2 tablets (650 mg  total) by mouth every 6 (six) hours as needed for mild pain (or Fever >/= 101). (Patient not taking: Reported on 05/25/2019)  . lidocaine-prilocaine (EMLA) cream Apply small amount to port a cath site and cover with plastic wrap 1 hour prior to chemotherapy appointments (Patient not taking: Reported on 06/22/2019)  . traZODone (DESYREL) 100 MG tablet TAKE 1 TABLET BY MOUTH EVERY DAY AT BEDTIME AS NEEDED (Patient not taking: Reported on 05/25/2019)   No facility-administered encounter medications on file as of 06/22/2019.     ALLERGIES:  Allergies  Allergen Reactions  . Other     No Meat- Patient is vegetarian     PHYSICAL EXAM:  ECOG Performance status: 0  Vitals:   06/22/19 0748  BP: (!) 142/77  Pulse: 70  Resp: 18  Temp: 97.9 F (36.6 C)  SpO2: 100%   Filed Weights   06/22/19 0748  Weight: 173 lb 3.2 oz (78.6 kg)    Physical Exam Vitals signs reviewed.  Constitutional:      Appearance: Normal appearance.  Cardiovascular:     Rate and Rhythm: Normal rate and regular rhythm.     Heart sounds: Normal heart sounds.  Pulmonary:     Effort: Pulmonary effort is normal.     Breath sounds: Normal breath sounds.  Abdominal:     General: There is  no distension.     Palpations: Abdomen is soft. There is no mass.  Musculoskeletal:        General: No swelling.  Lymphadenopathy:     Cervical: No cervical adenopathy.  Skin:    General: Skin is warm.  Neurological:     General: No focal deficit present.     Mental Status: She is alert and oriented to person, place, and time.  Psychiatric:        Mood and Affect: Mood normal.        Behavior: Behavior normal.      LABORATORY DATA:  I have reviewed the labs as listed.  CBC    Component Value Date/Time   WBC 5.0 06/22/2019 0757   RBC 3.19 (L) 06/22/2019 0757   HGB 9.1 (L) 06/22/2019 0757   HCT 29.7 (L) 06/22/2019 0757   PLT 275 06/22/2019 0757   MCV 93.1 06/22/2019 0757   MCH 28.5 06/22/2019 0757   MCHC 30.6 06/22/2019 0757   RDW 15.6 (H) 06/22/2019 0757   LYMPHSABS 0.9 06/22/2019 0757   MONOABS 0.5 06/22/2019 0757   EOSABS 0.1 06/22/2019 0757   BASOSABS 0.0 06/22/2019 0757   CMP Latest Ref Rng & Units 06/22/2019 06/15/2019 06/08/2019  Glucose 70 - 99 mg/dL 113(H) 134(H) 96  BUN 8 - 23 mg/dL _0 Creatinine 0.44 - 1.00 mg/dL 0.62 0.55 0.54  Sodium 135 - 145 mmol/L 141 140 141  Potassium 3.5 - 5.1 mmol/L 3.3(L) 3.9 3.8  Chloride 98 - 111 mmol/L 105 105 106  CO2 22 - 32 mmol/L _1 Calcium 8.9 - 10.3 mg/dL 9.6 9.7 9.6  Total Protein 6.5 - 8.1 g/dL 7.0 7.3 6.8  Total Bilirubin 0.3 - 1.2 mg/dL 0.3 0.6 0.5  Alkaline Phos 38 - 126 U/L 63 74 71  AST 15 - 41 U/L _2 ALT 0 - 44 U/L _3 DIAGNOSTIC IMAGING:  I have independently reviewed the scans and discussed with the patient.     ASSESSMENT & PLAN:   Malignant neoplasm of upper-outer quadrant of left  breast in female, estrogen receptor positive ([place]) 1.  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 -4 cycles of dose dense AC from 02/01/2019 through 03/15/2019. -Weekly paclitaxel started on 03/30/2019.  Week 10 treatment on 06/15/2019. -She has been tolerating paclitaxel very well.  Denies any neuropathy issues. -We reviewed her blood work.  She will proceed with week 11 today.  I will see her back in 4 weeks for follow-up. -We will consider starting her on anastrozole.  We will also consider obtaining a baseline bone density test. -We will make a referral to Dr. Rhunette Croft in [place] for opinion on radiation therapy.  2.  Unprovoked pulmonary embolism: - CT scan on 05/13/2019 showed submassive PE with right heart strain by CT evidence.  This happened while she is receiving chemotherapy in the adjuvant setting while working. -She is taking Eliquis twice daily without any problems. -She has been off of oxygen since 05/30/2019 and maintaining her saturations.  3.  Family history: -Sister had metastatic breast cancer at 21.  We will arrange for germline mutation testing with our geneticist.  4.  Sleep problems: -She will use trazodone 50 mg at bedtime as needed.   Total time spent is 25 minutes with more than 50% of the time spent face-to-face discussing treatment plan, counseling and coordination of care.  Orders placed this encounter:  No orders of the defined types were placed in this encounter.     Derek Jack, MD [place] [phone]

## 2019-06-22 NOTE — Progress Notes (Signed)
Faxed patient records to [facility] [phone] for referral.

## 2019-06-23 ENCOUNTER — Encounter (HOSPITAL_COMMUNITY): Payer: Self-pay | Admitting: *Deleted

## 2019-06-23 NOTE — Assessment & Plan Note (Signed)
1.  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 -4 cycles of dose dense AC from 02/01/2019 through 03/15/2019. -Weekly paclitaxel started on 03/30/2019.  Week 10 treatment on 06/15/2019. -She has been tolerating paclitaxel very well.  Denies any neuropathy issues. -We reviewed her blood work.  She will proceed with week 11 today.  I will see her back in 4 weeks for follow-up. -We will consider starting her on anastrozole.  We will also consider obtaining a baseline bone density test. -We will make a referral to Dr. Rhunette Croft in [place] for opinion on radiation therapy.  2.  Unprovoked pulmonary embolism: - CT scan on 05/13/2019 showed submassive PE with right heart strain by CT evidence.  This happened while she is receiving chemotherapy in the adjuvant setting while working. -She is taking Eliquis twice daily without any problems. -She has been off of oxygen since 05/30/2019 and maintaining her saturations.  3.  Family history: -Sister had metastatic breast cancer at 75.  We will arrange for germline mutation testing with our geneticist.  4.  Sleep problems: -She will use trazodone 50 mg at bedtime as needed.

## 2019-06-29 ENCOUNTER — Inpatient Hospital Stay (HOSPITAL_COMMUNITY): Payer: Medicaid - Out of State | Attending: Hematology

## 2019-06-29 ENCOUNTER — Other Ambulatory Visit: Payer: Self-pay

## 2019-06-29 ENCOUNTER — Inpatient Hospital Stay (HOSPITAL_COMMUNITY): Payer: Medicaid - Out of State

## 2019-06-29 ENCOUNTER — Encounter (HOSPITAL_COMMUNITY): Payer: Self-pay

## 2019-06-29 VITALS — BP 153/66 | HR 80 | Temp 96.8°F | Resp 18 | Wt 175.2 lb

## 2019-06-29 DIAGNOSIS — Z803 Family history of malignant neoplasm of breast: Secondary | ICD-10-CM | POA: Insufficient documentation

## 2019-06-29 DIAGNOSIS — Z87891 Personal history of nicotine dependence: Secondary | ICD-10-CM | POA: Insufficient documentation

## 2019-06-29 DIAGNOSIS — Z23 Encounter for immunization: Secondary | ICD-10-CM | POA: Insufficient documentation

## 2019-06-29 DIAGNOSIS — I10 Essential (primary) hypertension: Secondary | ICD-10-CM | POA: Diagnosis not present

## 2019-06-29 DIAGNOSIS — C50412 Malignant neoplasm of upper-outer quadrant of left female breast: Secondary | ICD-10-CM

## 2019-06-29 DIAGNOSIS — Z170 Estrogen receptor positive status [ER+]: Secondary | ICD-10-CM | POA: Insufficient documentation

## 2019-06-29 DIAGNOSIS — Z5111 Encounter for antineoplastic chemotherapy: Secondary | ICD-10-CM | POA: Insufficient documentation

## 2019-06-29 DIAGNOSIS — Z79899 Other long term (current) drug therapy: Secondary | ICD-10-CM | POA: Diagnosis not present

## 2019-06-29 DIAGNOSIS — E119 Type 2 diabetes mellitus without complications: Secondary | ICD-10-CM | POA: Insufficient documentation

## 2019-06-29 DIAGNOSIS — Z7901 Long term (current) use of anticoagulants: Secondary | ICD-10-CM | POA: Insufficient documentation

## 2019-06-29 DIAGNOSIS — G479 Sleep disorder, unspecified: Secondary | ICD-10-CM | POA: Diagnosis not present

## 2019-06-29 DIAGNOSIS — Z86711 Personal history of pulmonary embolism: Secondary | ICD-10-CM | POA: Insufficient documentation

## 2019-06-29 LAB — CBC WITH DIFFERENTIAL/PLATELET
Abs Immature Granulocytes: 0.03 10*3/uL (ref 0.00–0.07)
Basophils Absolute: 0 10*3/uL (ref 0.0–0.1)
Basophils Relative: 0 %
Eosinophils Absolute: 0.1 10*3/uL (ref 0.0–0.5)
Eosinophils Relative: 1 %
HCT: 29.3 % — ABNORMAL LOW (ref 36.0–46.0)
Hemoglobin: 9 g/dL — ABNORMAL LOW (ref 12.0–15.0)
Immature Granulocytes: 1 %
Lymphocytes Relative: 21 %
Lymphs Abs: 1 10*3/uL (ref 0.7–4.0)
MCH: 28.8 pg (ref 26.0–34.0)
MCHC: 30.7 g/dL (ref 30.0–36.0)
MCV: 93.9 fL (ref 80.0–100.0)
Monocytes Absolute: 0.6 10*3/uL (ref 0.1–1.0)
Monocytes Relative: 12 %
Neutro Abs: 3.2 10*3/uL (ref 1.7–7.7)
Neutrophils Relative %: 65 %
Platelets: 280 10*3/uL (ref 150–400)
RBC: 3.12 MIL/uL — ABNORMAL LOW (ref 3.87–5.11)
RDW: 15.6 % — ABNORMAL HIGH (ref 11.5–15.5)
WBC: 4.9 10*3/uL (ref 4.0–10.5)
nRBC: 0 % (ref 0.0–0.2)

## 2019-06-29 LAB — COMPREHENSIVE METABOLIC PANEL
ALT: 23 U/L (ref 0–44)
AST: 18 U/L (ref 15–41)
Albumin: 3.8 g/dL (ref 3.5–5.0)
Alkaline Phosphatase: 65 U/L (ref 38–126)
Anion gap: 9 (ref 5–15)
BUN: 10 mg/dL (ref 8–23)
CO2: 26 mmol/L (ref 22–32)
Calcium: 9.7 mg/dL (ref 8.9–10.3)
Chloride: 107 mmol/L (ref 98–111)
Creatinine, Ser: 0.49 mg/dL (ref 0.44–1.00)
GFR calc Af Amer: >60 mL/min (ref >60)
GFR calc non Af Amer: >60 mL/min (ref >60)
Glucose, Bld: 108 mg/dL — ABNORMAL HIGH (ref 70–99)
Potassium: 3.8 mmol/L (ref 3.5–5.1)
Sodium: 142 mmol/L (ref 135–145)
Total Bilirubin: 0.2 mg/dL — ABNORMAL LOW (ref 0.3–1.2)
Total Protein: 6.6 g/dL (ref 6.5–8.1)

## 2019-06-29 MED ORDER — INFLUENZA VAC SPLIT QUAD 0.5 ML IM SUSY
0.5000 mL | PREFILLED_SYRINGE | Freq: Once | INTRAMUSCULAR | Status: AC
  Administered 2019-06-29: 0.5 mL via INTRAMUSCULAR
  Filled 2019-06-29: qty 0.5

## 2019-06-29 MED ORDER — SODIUM CHLORIDE 0.9 % IV SOLN
Freq: Once | INTRAVENOUS | Status: AC
  Administered 2019-06-29: 11:00:00 via INTRAVENOUS

## 2019-06-29 MED ORDER — FAMOTIDINE IN NACL 20-0.9 MG/50ML-% IV SOLN
INTRAVENOUS | Status: AC
  Filled 2019-06-29: qty 50

## 2019-06-29 MED ORDER — HEPARIN SOD (PORK) LOCK FLUSH 100 UNIT/ML IV SOLN
500.0000 [IU] | Freq: Once | INTRAVENOUS | Status: AC | PRN
  Administered 2019-06-29: 500 [IU]

## 2019-06-29 MED ORDER — DIPHENHYDRAMINE HCL 50 MG/ML IJ SOLN
INTRAMUSCULAR | Status: AC
  Filled 2019-06-29: qty 1

## 2019-06-29 MED ORDER — DIPHENHYDRAMINE HCL 50 MG/ML IJ SOLN
25.0000 mg | Freq: Once | INTRAMUSCULAR | Status: AC
  Administered 2019-06-29: 25 mg via INTRAVENOUS

## 2019-06-29 MED ORDER — SODIUM CHLORIDE 0.9 % IV SOLN
80.0000 mg/m2 | Freq: Once | INTRAVENOUS | Status: AC
  Administered 2019-06-29: 162 mg via INTRAVENOUS
  Filled 2019-06-29: qty 27

## 2019-06-29 MED ORDER — FAMOTIDINE IN NACL 20-0.9 MG/50ML-% IV SOLN
20.0000 mg | Freq: Once | INTRAVENOUS | Status: AC
  Administered 2019-06-29: 20 mg via INTRAVENOUS

## 2019-06-29 MED ORDER — SODIUM CHLORIDE 0.9 % IV SOLN
20.0000 mg | Freq: Once | INTRAVENOUS | Status: AC
  Administered 2019-06-29: 20 mg via INTRAVENOUS
  Filled 2019-06-29: qty 20

## 2019-06-29 MED ORDER — SODIUM CHLORIDE 0.9% FLUSH
10.0000 mL | INTRAVENOUS | Status: DC | PRN
  Administered 2019-06-29: 10 mL
  Filled 2019-06-29: qty 10

## 2019-06-29 NOTE — Patient Instructions (Signed)
[facility] Discharge Instructions for Patients Receiving Chemotherapy  Today you received the following chemotherapy agents   To help prevent nausea and vomiting after your treatment, we encourage you to take your nausea medication   If you develop nausea and vomiting that is not controlled by your nausea medication, call the clinic.   BELOW ARE SYMPTOMS THAT SHOULD BE REPORTED IMMEDIATELY:  *FEVER GREATER THAN 100.5 F  *CHILLS WITH OR WITHOUT FEVER  NAUSEA AND VOMITING THAT IS NOT CONTROLLED WITH YOUR NAUSEA MEDICATION  *UNUSUAL SHORTNESS OF BREATH  *UNUSUAL BRUISING OR BLEEDING  TENDERNESS IN MOUTH AND THROAT WITH OR WITHOUT PRESENCE OF ULCERS  *URINARY PROBLEMS  *BOWEL PROBLEMS  UNUSUAL RASH Items with * indicate a potential emergency and should be followed up as soon as possible.  Feel free to call the clinic should you have any questions or concerns. The clinic phone number is [phone].  Please show the CHEMO ALERT CARD at check-in to the Emergency Department and triage nurse.   

## 2019-06-29 NOTE — Progress Notes (Signed)
Pt presents today for last tx of Taxol. MAR reviewed and updated. VS within parameters for tx. Labs pending. Pt has no complaints of any changes since the last visit.   Labs within parameters for tx. Message sent to RNester NP. Labs reviewed by NP. Proceed with tx.   Treatment given today per MD orders. Tolerated infusion without adverse affects. Vital signs stable. No complaints at this time. Discharged from clinic ambulatory. F/U with [facility] as scheduled.

## 2019-07-13 ENCOUNTER — Inpatient Hospital Stay (HOSPITAL_COMMUNITY): Payer: Medicaid - Out of State

## 2019-07-13 ENCOUNTER — Other Ambulatory Visit: Payer: Self-pay

## 2019-07-13 ENCOUNTER — Inpatient Hospital Stay (HOSPITAL_BASED_OUTPATIENT_CLINIC_OR_DEPARTMENT_OTHER): Payer: Medicaid - Out of State | Admitting: Hematology

## 2019-07-13 ENCOUNTER — Encounter (HOSPITAL_COMMUNITY): Payer: Self-pay | Admitting: Hematology

## 2019-07-13 VITALS — BP 162/78 | HR 79 | Temp 97.3°F | Resp 16 | Wt 173.0 lb

## 2019-07-13 DIAGNOSIS — Z780 Asymptomatic menopausal state: Secondary | ICD-10-CM

## 2019-07-13 DIAGNOSIS — C50412 Malignant neoplasm of upper-outer quadrant of left female breast: Secondary | ICD-10-CM

## 2019-07-13 DIAGNOSIS — Z170 Estrogen receptor positive status [ER+]: Secondary | ICD-10-CM

## 2019-07-13 DIAGNOSIS — Z5111 Encounter for antineoplastic chemotherapy: Secondary | ICD-10-CM | POA: Diagnosis not present

## 2019-07-13 LAB — COMPREHENSIVE METABOLIC PANEL
ALT: 30 U/L (ref 0–44)
AST: 22 U/L (ref 15–41)
Albumin: 3.9 g/dL (ref 3.5–5.0)
Alkaline Phosphatase: 103 U/L (ref 38–126)
Anion gap: 10 (ref 5–15)
BUN: 11 mg/dL (ref 8–23)
CO2: 24 mmol/L (ref 22–32)
Calcium: 10.2 mg/dL (ref 8.9–10.3)
Chloride: 105 mmol/L (ref 98–111)
Creatinine, Ser: 0.55 mg/dL (ref 0.44–1.00)
GFR calc Af Amer: >60 mL/min (ref >60)
GFR calc non Af Amer: >60 mL/min (ref >60)
Glucose, Bld: 95 mg/dL (ref 70–99)
Potassium: 4.1 mmol/L (ref 3.5–5.1)
Sodium: 139 mmol/L (ref 135–145)
Total Bilirubin: 0.7 mg/dL (ref 0.3–1.2)
Total Protein: 7.2 g/dL (ref 6.5–8.1)

## 2019-07-13 LAB — CBC WITH DIFFERENTIAL/PLATELET
Abs Immature Granulocytes: 0.01 10*3/uL (ref 0.00–0.07)
Basophils Absolute: 0 10*3/uL (ref 0.0–0.1)
Basophils Relative: 0 %
Eosinophils Absolute: 0.1 10*3/uL (ref 0.0–0.5)
Eosinophils Relative: 1 %
HCT: 32.8 % — ABNORMAL LOW (ref 36.0–46.0)
Hemoglobin: 10 g/dL — ABNORMAL LOW (ref 12.0–15.0)
Immature Granulocytes: 0 %
Lymphocytes Relative: 18 %
Lymphs Abs: 1.4 10*3/uL (ref 0.7–4.0)
MCH: 28.2 pg (ref 26.0–34.0)
MCHC: 30.5 g/dL (ref 30.0–36.0)
MCV: 92.7 fL (ref 80.0–100.0)
Monocytes Absolute: 1.1 10*3/uL — ABNORMAL HIGH (ref 0.1–1.0)
Monocytes Relative: 15 %
Neutro Abs: 4.9 10*3/uL (ref 1.7–7.7)
Neutrophils Relative %: 66 %
Platelets: 296 10*3/uL (ref 150–400)
RBC: 3.54 MIL/uL — ABNORMAL LOW (ref 3.87–5.11)
RDW: 15.4 % (ref 11.5–15.5)
WBC: 7.5 10*3/uL (ref 4.0–10.5)
nRBC: 0 % (ref 0.0–0.2)

## 2019-07-13 MED ORDER — ANASTROZOLE 1 MG PO TABS: 1.0000 mg | ORAL_TABLET | Freq: Every day | ORAL | 2 refills | Status: DC

## 2019-07-13 NOTE — Progress Notes (Signed)
[place], [place] [address]   CLINIC:  Medical Oncology/Hematology  PCP:  [name], MD [address] [place] [address] [phone]   REASON FOR VISIT: Follow-up for left breast cancer.  CURRENT THERAPY: Anastrozole 1 mg daily.  BRIEF ONCOLOGIC HISTORY:  Oncology History  Malignant neoplasm of upper-outer quadrant of left breast in female, estrogen receptor positive ([facility])  01/04/2019 Initial Diagnosis   Malignant neoplasm of upper-outer quadrant of left breast in female, estrogen receptor positive ([facility])   01/19/2019 Cancer Staging   Staging form: Breast, AJCC 8th Edition - Pathologic stage from 01/19/2019: Stage IIA (pT2, pN1, cM0, G3, ER+, PR+, HER2-) - Signed by Derek Jack, MD on 01/19/2019   02/01/2019 -  Chemotherapy   The patient had DOXOrubicin (ADRIAMYCIN) chemo injection 120 mg, 60 mg/m2 = 120 mg, Intravenous,  Once, 4 of 4 cycles Administration: 120 mg (02/01/2019), 120 mg (02/15/2019), 120 mg (03/02/2019), 120 mg (03/15/2019) palonosetron (ALOXI) injection 0.25 mg, 0.25 mg, Intravenous,  Once, 4 of 4 cycles Administration: 0.25 mg (02/01/2019), 0.25 mg (02/15/2019), 0.25 mg (03/02/2019), 0.25 mg (03/15/2019) pegfilgrastim (NEULASTA ONPRO KIT) injection 6 mg, 6 mg, Subcutaneous, Once, 4 of 4 cycles Administration: 6 mg (02/01/2019), 6 mg (02/15/2019), 6 mg (03/02/2019), 6 mg (03/15/2019) cyclophosphamide (CYTOXAN) 1,200 mg in sodium chloride 0.9 % 250 mL chemo infusion, 600 mg/m2 = 1,200 mg, Intravenous,  Once, 4 of 4 cycles Administration: 1,200 mg (02/01/2019), 1,200 mg (02/15/2019), 1,200 mg (03/02/2019), 1,200 mg (03/15/2019) PACLitaxel (TAXOL) 162 mg in sodium chloride 0.9 % 250 mL chemo infusion (</= '80mg'$ /m2), 80 mg/m2 = 162 mg, Intravenous,  Once, 12 of 12 cycles Administration: 162 mg (03/30/2019), 162 mg (04/06/2019), 162 mg (04/13/2019), 162 mg (04/20/2019), 162 mg (04/27/2019), 162 mg (05/18/2019), 162 mg (05/25/2019), 162 mg  (06/01/2019), 162 mg (06/08/2019), 162 mg (06/15/2019), 162 mg (06/22/2019), 162 mg (06/29/2019) fosaprepitant (EMEND) 150 mg, dexamethasone (DECADRON) 12 mg in sodium chloride 0.9 % 145 mL IVPB, , Intravenous,  Once, 4 of 4 cycles Administration:  (02/01/2019),  (02/15/2019),  (03/02/2019),  (03/15/2019)  for chemotherapy treatment.       CANCER STAGING: Cancer Staging Malignant neoplasm of upper-outer quadrant of left breast in female, estrogen receptor positive ([facility]) Staging form: Breast, AJCC 8th Edition - Clinical: No stage assigned - Unsigned - Pathologic stage from 01/19/2019: Stage IIA (pT2, pN1, cM0, G3, ER+, PR+, HER2-) - Signed by Derek Jack, MD on 01/19/2019    INTERVAL HISTORY:  Bridget Richardson 62 y.o. female seen for follow-up of left breast cancer.  She met with Dr. Osborne Casco in [place] for radiation oncology consultation.  She reports feeling that her feet are thick in the last 2 weeks.  Denies any tingling or numbness in the hands.  No new onset pains.  She reports the trazodone 50 mg tablet for sleep is not helping anymore.  REVIEW OF SYSTEMS:  Review of Systems  All other systems reviewed and are negative.    PAST MEDICAL/SURGICAL HISTORY:  Past Medical History:  Diagnosis Date  . Cancer [facility])    stage 2 total mastctomy left breast dx march 2020  . Coronary artery disease   . Diabetes mellitus without complication ([facility])   . Hypertension    Past Surgical History:  Procedure Laterality Date  . ANKLE SURGERY Right 2009  . MASTECTOMY MODIFIED RADICAL Left 01/04/2019   Procedure: MASTECTOMY MODIFIED RADICAL;  Surgeon: Aviva Signs, MD;  Location: [facility];  Service: General;  Laterality:  Left;  . PORTACATH PLACEMENT Right 01/30/2019   Procedure: INSERTION PORT-A-CATH;  Surgeon: Aviva Signs, MD;  Location: [facility];  Service: General;  Laterality: Right;  . WRIST SURGERY Right 1997     SOCIAL HISTORY:  Social History   Socioeconomic History  . Marital status:  Divorced    Spouse name: Not on file  . Number of children: Not on file  . Years of education: Not on file  . Highest education level: Not on file  Occupational History  . Not on file  Social Needs  . Financial resource strain: Not on file  . Food insecurity    Worry: Not on file    Inability: Not on file  . Transportation needs    Medical: Not on file    Non-medical: Not on file  Tobacco Use  . Smoking status: Former Smoker    Packs/day: 0.25    Years: 2.00    Pack years: 0.50    Types: Cigarettes    Quit date: 1983    Years since quitting: 37.9  . Smokeless tobacco: Never Used  Substance and Sexual Activity  . Alcohol use: Never    Frequency: Never  . Drug use: Never  . Sexual activity: Not Currently  Lifestyle  . Physical activity    Days per week: Not on file    Minutes per session: Not on file  . Stress: Not on file  Relationships  . Social Herbalist on phone: Not on file    Gets together: Not on file    Attends religious service: Not on file    Active member of club or organization: Not on file    Attends meetings of clubs or organizations: Not on file    Relationship status: Not on file  . Intimate partner violence    Fear of current or ex partner: Not on file    Emotionally abused: Not on file    Physically abused: Not on file    Forced sexual activity: Not on file  Other Topics Concern  . Not on file  Social History Narrative  . Not on file    FAMILY HISTORY:  No family history on file.  CURRENT MEDICATIONS:  Outpatient Encounter Medications as of 07/13/2019  Medication Sig  . apixaban (ELIQUIS) 5 MG TABS tablet Take 1 tablet (5 mg total) by mouth 2 (two) times daily.  Marland Kitchen atorvastatin (LIPITOR) 40 MG tablet Take 40 mg by mouth at bedtime.   . carvedilol (COREG) 12.5 MG tablet TAKE 1 TABLET BY MOUTH ONLY ON THURSDAY  . carvedilol (COREG) 6.25 MG tablet Take 1 tablet (6.25 mg total) by mouth 2 (two) times daily with a meal.  .  CYCLOPHOSPHAMIDE IV Inject into the vein every 14 (fourteen) days.  . enalapril (VASOTEC) 20 MG tablet Take 20 mg by mouth 2 (two) times daily.  . ferrous sulfate 325 (65 FE) MG tablet Take 325 mg by mouth daily with breakfast.  . lidocaine-prilocaine (EMLA) cream Apply small amount to port a cath site and cover with plastic wrap 1 hour prior to chemotherapy appointments  . metFORMIN (GLUCOPHAGE) 850 MG tablet Take 850 mg by mouth daily with breakfast.   . PACLITAXEL IV Inject into the vein once a week.  . pantoprazole (PROTONIX) 40 MG tablet Take 1 tablet (40 mg total) by mouth daily.  . Vitamin D, Ergocalciferol, (DRISDOL) 1.25 MG (50000 UT) CAPS capsule TAKE 1 CAPSULE ORALLY ONCE A WEEK FOR 4 WEEKS THEN  ONCE MONTHLY  . acetaminophen (TYLENOL) 325 MG tablet Take 2 tablets (650 mg total) by mouth every 6 (six) hours as needed for mild pain (or Fever >/= 101). (Patient not taking: Reported on 07/13/2019)  . traZODone (DESYREL) 100 MG tablet TAKE 1 TABLET BY MOUTH EVERY DAY AT BEDTIME AS NEEDED (Patient not taking: Reported on 07/13/2019)   No facility-administered encounter medications on file as of 07/13/2019.     ALLERGIES:  Allergies  Allergen Reactions  . Other     No Meat- Patient is vegetarian     PHYSICAL EXAM:  ECOG Performance status: 0  Vitals:   07/13/19 1358  BP: (!) 162/78  Pulse: 79  Resp: 16  Temp: (!) 97.3 F (36.3 C)  SpO2: 100%   Filed Weights   07/13/19 1358  Weight: 173 lb (78.5 kg)    Physical Exam Vitals signs reviewed.  Constitutional:      Appearance: Normal appearance.  Cardiovascular:     Rate and Rhythm: Normal rate and regular rhythm.     Heart sounds: Normal heart sounds.  Pulmonary:     Effort: Pulmonary effort is normal.     Breath sounds: Normal breath sounds.  Abdominal:     General: There is no distension.     Palpations: Abdomen is soft. There is no mass.  Musculoskeletal:        General: No swelling.  Lymphadenopathy:      Cervical: No cervical adenopathy.  Skin:    General: Skin is warm.  Neurological:     General: No focal deficit present.     Mental Status: She is alert and oriented to person, place, and time.  Psychiatric:        Mood and Affect: Mood normal.        Behavior: Behavior normal.      LABORATORY DATA:  I have reviewed the labs as listed.  CBC    Component Value Date/Time   WBC 7.5 07/13/2019 1337   RBC 3.54 (L) 07/13/2019 1337   HGB 10.0 (L) 07/13/2019 1337   HCT 32.8 (L) 07/13/2019 1337   PLT 296 07/13/2019 1337   MCV 92.7 07/13/2019 1337   MCH 28.2 07/13/2019 1337   MCHC 30.5 07/13/2019 1337   RDW 15.4 07/13/2019 1337   LYMPHSABS 1.4 07/13/2019 1337   MONOABS 1.1 (H) 07/13/2019 1337   EOSABS 0.1 07/13/2019 1337   BASOSABS 0.0 07/13/2019 1337   CMP Latest Ref Rng & Units 06/29/2019 06/22/2019 06/15/2019  Glucose 70 - 99 mg/dL 108(H) 113(H) 134(H)  BUN 8 - 23 mg/dL _0 Creatinine 0.44 - 1.00 mg/dL 0.49 0.62 0.55  Sodium 135 - 145 mmol/L 142 141 140  Potassium 3.5 - 5.1 mmol/L 3.8 3.3(L) 3.9  Chloride 98 - 111 mmol/L 107 105 105  CO2 22 - 32 mmol/L _1 Calcium 8.9 - 10.3 mg/dL 9.7 9.6 9.7  Total Protein 6.5 - 8.1 g/dL 6.6 7.0 7.3  Total Bilirubin 0.3 - 1.2 mg/dL 0.2(L) 0.3 0.6  Alkaline Phos 38 - 126 U/L 65 63 74  AST 15 - 41 U/L _2 ALT 0 - 44 U/L _3 DIAGNOSTIC IMAGING:  I have independently reviewed the scans and discussed with the patient.     ASSESSMENT & PLAN:   Malignant neoplasm of upper-outer quadrant of left breast in female, estrogen receptor positive ([facility]) 1.  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 -4 cycles of dose dense AC from 02/01/2019 through 03/15/2019. -12 weeks of paclitaxel from 03/30/2019 through  06/29/2019. -She was seen by Dr. Rhunette Croft on 07/05/2019. -We will start her on anastrozole 1 mg daily for at least 5 years.  We discussed the side effects in detail including but not limited to hot flashes, musculoskeletal symptoms, decreased bone mineral density. -I will reevaluate her in 3 months.  We will check a bone density test prior to next visit.  2.  Unprovoked pulmonary embolism: - CT scan on 05/13/2019 showed submassive PE with right heart strain by CT evidence.  This happened while she is receiving chemotherapy in the adjuvant setting while working. -She is taking Eliquis twice daily without any problems. -She has been off of oxygen since 05/30/2019 and maintaining her saturations.  3.  Family history: -Sister had metastatic breast cancer at 15.  We will arrange for germline mutation testing with our geneticist.  4.  Sleep problems: -She will use trazodone 50 mg at bedtime as needed.   Total time spent is 25 minutes with more than 50% of the time spent face-to-face discussing treatment plan, counseling and coordination of care.  Orders placed this encounter:  No orders of the defined types were placed in this encounter.     Derek Jack, MD [place] [phone]

## 2019-07-13 NOTE — Assessment & Plan Note (Addendum)
1.  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 -4 cycles of dose dense AC from 02/01/2019 through 03/15/2019. -12 weeks of paclitaxel from 03/30/2019 through 06/29/2019. -She was seen by Dr. Osborne Casco (radiation oncology in [place]) on 07/05/2019.  She will start radiation soon. -We will start her on anastrozole 1 mg daily for at least 5 years.  We discussed the side effects in detail including but not limited to hot flashes, musculoskeletal symptoms, decreased bone mineral density. -I will reevaluate her in 3 months.  We will check a bone density test prior to next visit.  We will also check vitamin D level. -She reported that her feet are feeling thick in the bottom for the last couple of weeks.  She does not report any neuropathic pains.  This is consistent with early neuropathy.  2.  Unprovoked pulmonary embolism: - CT scan on 05/13/2019 showed submassive PE with right heart strain by CT evidence.  This happened while she is receiving chemotherapy in the adjuvant setting while working. -She is taking Eliquis twice daily without any problems. -She has been off of oxygen since 05/30/2019 and maintaining her saturations.  3.  Family history: -Sister had metastatic breast cancer at 48.  She has an appointment with Roma Kayser in December for further testing.  4.  Sleep problems: -She reports that trazodone 50 mg tablet is no longer helping.  She wants to go back on melatonin which helped her in the past.

## 2019-07-13 NOTE — Patient Instructions (Addendum)
[place] at [facility] Discharge Instructions  You were seen today by [name]. He went over your recent lab results. He will send in a new prescription for Arimidex (anastrazole) 1 mg, you will need to take this daily at the same time each day for at least 5 years. He will see you back in 3 months for labs, bone density and follow up.   Thank you for choosing [place] at [facility] to provide your oncology and hematology care.  To afford each patient quality time with our provider, please arrive at least 15 minutes before your scheduled appointment time.   If you have a lab appointment with the [facility] please come in thru the  Main Entrance and check in at the main information desk  You need to re-schedule your appointment should you arrive 10 or more minutes late.  We strive to give you quality time with our providers, and arriving late affects you and other patients whose appointments are after yours.  Also, if you no show three or more times for appointments you may be dismissed from the clinic at the providers discretion.     Again, thank you for choosing [facility].  Our hope is that these requests will decrease the amount of time that you wait before being seen by our physicians.       _____________________________________________________________  Should you have questions after your visit to [facility], please contact our office at [phone] between the hours of 8:00 a.m. and 4:30 p.m.  Voicemails left after 4:00 p.m. will not be returned until the following business day.  For prescription refill requests, have your pharmacy contact our office and allow 72 hours.    Cancer Center Support Programs:   > Cancer Support Group  2nd Tuesday of the month 1pm-2pm, Journey Room

## 2019-07-18 ENCOUNTER — Encounter (HOSPITAL_COMMUNITY): Payer: Self-pay | Admitting: *Deleted

## 2019-08-03 ENCOUNTER — Inpatient Hospital Stay (HOSPITAL_COMMUNITY): Payer: Medicaid - Out of State | Attending: Genetic Counselor | Admitting: Genetic Counselor

## 2019-08-03 ENCOUNTER — Inpatient Hospital Stay (HOSPITAL_COMMUNITY): Payer: Medicaid - Out of State

## 2019-08-03 DIAGNOSIS — C50412 Malignant neoplasm of upper-outer quadrant of left female breast: Secondary | ICD-10-CM | POA: Diagnosis not present

## 2019-08-03 DIAGNOSIS — Z170 Estrogen receptor positive status [ER+]: Secondary | ICD-10-CM | POA: Diagnosis not present

## 2019-08-03 DIAGNOSIS — Z803 Family history of malignant neoplasm of breast: Secondary | ICD-10-CM

## 2019-08-06 ENCOUNTER — Encounter (HOSPITAL_COMMUNITY): Payer: Self-pay | Admitting: *Deleted

## 2019-08-07 ENCOUNTER — Encounter (HOSPITAL_COMMUNITY): Payer: Self-pay | Admitting: Genetic Counselor

## 2019-08-07 ENCOUNTER — Other Ambulatory Visit (HOSPITAL_COMMUNITY): Payer: Self-pay | Admitting: *Deleted

## 2019-08-07 DIAGNOSIS — Z803 Family history of malignant neoplasm of breast: Secondary | ICD-10-CM | POA: Insufficient documentation

## 2019-08-07 MED ORDER — GABAPENTIN 100 MG PO CAPS: 100.0000 mg | ORAL_CAPSULE | Freq: Two times a day (BID) | ORAL | 1 refills | Status: DC

## 2019-08-07 NOTE — Progress Notes (Addendum)
REFERRING PROVIDER: Derek Jack, MD [address] [place],  [place] [address]  PRIMARY PROVIDER:  [name], MD  PRIMARY REASON FOR VISIT:  1. Malignant neoplasm of upper-outer quadrant of left breast in female, estrogen receptor positive ([place])   2. Family history of breast cancer      HISTORY OF PRESENT ILLNESS:  I connected with  Ms. Peacock on 08/07/2019 at 2 PM EDT by [employer] video conference and verified that I am speaking with the correct person using two identifiers.   Patient location: [place]  Provider location: Emilee Hero, a 63 y.o. female, was seen for a [facility] cancer genetics consultation at the request of [name] due to a personal and family history of breast cancer.  Ms. Rivere presents to clinic today to discuss the possibility of a hereditary predisposition to cancer, genetic testing, and to further clarify her future cancer risks, as well as potential cancer risks for family members.   In May 2020, at the age of 20, Ms. Krog was diagnosed with invasive ductal carcinoma of the left breast. The treatment plan includes chemotherapy, lumpectomy and radiation.  She reports that her radiation will occur in [place], [place].     CANCER HISTORY:  Oncology History  Malignant neoplasm of upper-outer quadrant of left breast in female, estrogen receptor positive ([place])  01/04/2019 Initial Diagnosis   Malignant neoplasm of upper-outer quadrant of left breast in female, estrogen receptor positive ([place])   01/19/2019 Cancer Staging   Staging form: Breast, AJCC 8th Edition - Pathologic stage from 01/19/2019: Stage IIA (pT2, pN1, cM0, G3, ER+, PR+, HER2-) - Signed by Derek Jack, MD on 01/19/2019   02/01/2019 -  Chemotherapy   The patient had DOXOrubicin (ADRIAMYCIN) chemo injection 120 mg, 60 mg/m2 = 120 mg, Intravenous,  Once, 4 of 4 cycles Administration: 120 mg (02/01/2019), 120 mg (02/15/2019), 120 mg (03/02/2019), 120 mg  (03/15/2019) palonosetron (ALOXI) injection 0.25 mg, 0.25 mg, Intravenous,  Once, 4 of 4 cycles Administration: 0.25 mg (02/01/2019), 0.25 mg (02/15/2019), 0.25 mg (03/02/2019), 0.25 mg (03/15/2019) pegfilgrastim (NEULASTA ONPRO KIT) injection 6 mg, 6 mg, Subcutaneous, Once, 4 of 4 cycles Administration: 6 mg (02/01/2019), 6 mg (02/15/2019), 6 mg (03/02/2019), 6 mg (03/15/2019) cyclophosphamide (CYTOXAN) 1,200 mg in sodium chloride 0.9 % 250 mL chemo infusion, 600 mg/m2 = 1,200 mg, Intravenous,  Once, 4 of 4 cycles Administration: 1,200 mg (02/01/2019), 1,200 mg (02/15/2019), 1,200 mg (03/02/2019), 1,200 mg (03/15/2019) PACLitaxel (TAXOL) 162 mg in sodium chloride 0.9 % 250 mL chemo infusion (</= 88m/m2), 80 mg/m2 = 162 mg, Intravenous,  Once, 12 of 12 cycles Administration: 162 mg (03/30/2019), 162 mg (04/06/2019), 162 mg (04/13/2019), 162 mg (04/20/2019), 162 mg (04/27/2019), 162 mg (05/18/2019), 162 mg (05/25/2019), 162 mg (06/01/2019), 162 mg (06/08/2019), 162 mg (06/15/2019), 162 mg (06/22/2019), 162 mg (06/29/2019) fosaprepitant (EMEND) 150 mg, dexamethasone (DECADRON) 12 mg in sodium chloride 0.9 % 145 mL IVPB, , Intravenous,  Once, 4 of 4 cycles Administration:  (02/01/2019),  (02/15/2019),  (03/02/2019),  (03/15/2019)  for chemotherapy treatment.       RISK FACTORS:  Menarche was at age 63  First live birth at age 63  Ovaries intact: yes.  Hysterectomy: no.  Menopausal status: postmenopausal.  HRT use: 0 years. Colonoscopy: yes; normal. Mammogram within the last year: yes. Number of breast biopsies: 1. Up to date with pelvic exams: yes. Any excessive radiation exposure in the past: no  Past Medical History:  Diagnosis Date  . Cancer (  [place])    stage 2 total mastctomy left breast dx march 2020  . Coronary artery disease   . Diabetes mellitus without complication ([place])   . Family history of breast cancer   . Hypertension     Past Surgical History:  Procedure Laterality Date  . ANKLE SURGERY Right  2009  . MASTECTOMY MODIFIED RADICAL Left 01/04/2019   Procedure: MASTECTOMY MODIFIED RADICAL;  Surgeon: Aviva Signs, MD;  Location: [facility];  Service: General;  Laterality: Left;  . PORTACATH PLACEMENT Right 01/30/2019   Procedure: INSERTION PORT-A-CATH;  Surgeon: Aviva Signs, MD;  Location: [facility];  Service: General;  Laterality: Right;  . WRIST SURGERY Right 1997    Social History   Socioeconomic History  . Marital status: Divorced    Spouse name: Not on file  . Number of children: Not on file  . Years of education: Not on file  . Highest education level: Not on file  Occupational History  . Not on file  Tobacco Use  . Smoking status: Former Smoker    Packs/day: 0.25    Years: 2.00    Pack years: 0.50    Types: Cigarettes    Quit date: 1983    Years since quitting: 37.9  . Smokeless tobacco: Never Used  Substance and Sexual Activity  . Alcohol use: Never  . Drug use: Never  . Sexual activity: Not Currently  Other Topics Concern  . Not on file  Social History Narrative  . Not on file   Social Determinants of Health   Financial Resource Strain:   . Difficulty of Paying Living Expenses: Not on file  Food Insecurity:   . Worried About Charity fundraiser in the Last Year: Not on file  . Ran Out of Food in the Last Year: Not on file  Transportation Needs:   . Lack of Transportation (Medical): Not on file  . Lack of Transportation (Non-Medical): Not on file  Physical Activity:   . Days of Exercise per Week: Not on file  . Minutes of Exercise per Session: Not on file  Stress:   . Feeling of Stress : Not on file  Social Connections:   . Frequency of Communication with Friends and Family: Not on file  . Frequency of Social Gatherings with Friends and Family: Not on file  . Attends Religious Services: Not on file  . Active Member of Clubs or Organizations: Not on file  . Attends Archivist Meetings: Not on file  . Marital Status: Not on file     FAMILY  HISTORY:  We obtained a detailed, 4-generation family history.  Significant diagnoses are listed below: Family History  Problem Relation Age of Onset  . Hypertension Father   . Heart Problems Father   . Breast cancer Sister 31  . Breast cancer Maternal Aunt 39       d. 57  . Diabetes Sister   . Stroke Sister   . Aneurysm Sister   . Breast cancer Cousin 42       mets to bone at age 47    The patient has two daughters who are cancer free.  She has five sisters.  One sister died of triple negative breast cancer at age 30.  This sister, and one other, had genetic testing which was reportedly negative, but it is unknown if they had panel testing vs single gene testing.  The patient's mother is living and her father is deceased.  The patient's mother is  living at age 38.  She has one brother and four sisters. One sister had breast cancer at 17 and died at 42.  Another sister had a daughter who had breast cancer at 33.  The maternal grandparents are deceased.  The patient's father is deceased from heart diease.  He had one sister who did not have cancer.  His parents are deceased from non-cancer related issues.  Ms. Nathanson is aware of previous family history of genetic testing for hereditary cancer risks. Patient's maternal ancestors are of African American descent, and paternal ancestors are of African American descent. There is no reported Ashkenazi Jewish ancestry. There is no known consanguinity.    GENETIC COUNSELING ASSESSMENT: Ms. Moor is a 63 y.o. female with a personal and family history of breast cancer which is somewhat suggestive of a hereditary cancer syndrome and predisposition to cancer given the number of women with breast cancer, some with young ages of onset. We, therefore, discussed and recommended the following at today's visit.   DISCUSSION: We discussed that 5 - 10% of breast cancer is hereditary, with most cases associated with hereditary cancer syndrome.  There are  other genes that can be associated with hereditary breast cancer syndromes.  These include ATM, CHEK2 and PALB2.  It is unknown whether her two sisters who had genetic testing had single gene testing vs panel testing.  Therefore, it is appropriate to offer testing to Ms. Vanhook.  We discussed that testing is beneficial for several reasons including knowing how to follow individuals after completing their treatment, identifying whether potential treatment options such as PARP inhibitors would be beneficial, and understand if other family members could be at risk for cancer and allow them to undergo genetic testing.   We reviewed the characteristics, features and inheritance patterns of hereditary cancer syndromes. We also discussed genetic testing, including the appropriate family members to test, the process of testing, insurance coverage and turn-around-time for results. We discussed the implications of a negative, positive, carrier and/or variant of uncertain significant result. We recommended Ms. Bacote pursue genetic testing for the common hereditary gene panel. The Common Hereditary Gene Panel offered by [facility] includes sequencing and/or deletion duplication testing of the following 48 genes: APC, ATM, AXIN2, BARD1, BMPR1A, BRCA1, BRCA2, BRIP1, CDH1, CDK4, CDKN2A (p14ARF), CDKN2A (p16INK4a), CHEK2, CTNNA1, DICER1, EPCAM (Deletion/duplication testing only), GREM1 (promoter region deletion/duplication testing only), KIT, MEN1, MLH1, MSH2, MSH3, MSH6, MUTYH, NBN, NF1, NHTL1, PALB2, PDGFRA, PMS2, POLD1, POLE, PTEN, RAD50, RAD51C, RAD51D, RNF43, SDHB, SDHC, SDHD, SMAD4, SMARCA4. STK11, TP53, TSC1, TSC2, and VHL.  The following genes were evaluated for sequence changes only: SDHA and HOXB13 c.251G>A variant only.   Based on Ms. Friesz's personal and family history of cancer, she meets medical criteria for genetic testing. Despite that she meets criteria, she may still have an out of pocket cost. We discussed  that if her out of pocket cost for testing is over $100, the laboratory will call and confirm whether she wants to proceed with testing.  If the out of pocket cost of testing is less than $100 she will be billed by the genetic testing laboratory.   PLAN: Despite our recommendation, Ms. Hayduk did not wish to pursue genetic testing at today's visit. She lives in [place] and has [place].  She would like to be tested at her radiology center. We understand this decision and remain available to coordinate genetic testing at any time.  We recommend doing genetic testing through [facility] or [facility].  These laboratories will provide free family testing if there is a hereditary mutation identified in Ms. Halleck.  Free cascade family testing helps family members in their pursuit of genetic testing.   Lastly, we encouraged Ms. Labree to remain in contact with cancer genetics annually so that we can continuously update the family history and inform her of any changes in cancer genetics and testing that may be of benefit for this family.   Ms. Hagmann questions were answered to her satisfaction today. Our contact information was provided should additional questions or concerns arise. Thank you for the referral and allowing [place] to share in the care of your patient.   Sanad Fearnow P. [place], [place], [facility] Licensed, Insurance risk surveyor [email or web address] phone: [phone]  The patient was seen for a total of 45 minutes in face-to-face genetic counseling.  This patient was discussed with Drs. Magrinat, Lindi Adie and/or Burr Medico who agrees with the above.    _______________________________________________________________________ For Office Staff:  Number of people involved in session: 1 Was an Intern/ student involved with case: no

## 2019-09-04 ENCOUNTER — Other Ambulatory Visit (HOSPITAL_COMMUNITY): Payer: Self-pay | Admitting: Hematology

## 2019-09-04 DIAGNOSIS — C50412 Malignant neoplasm of upper-outer quadrant of left female breast: Secondary | ICD-10-CM

## 2019-09-04 DIAGNOSIS — Z170 Estrogen receptor positive status [ER+]: Secondary | ICD-10-CM

## 2019-10-16 ENCOUNTER — Other Ambulatory Visit: Payer: Self-pay

## 2019-10-16 ENCOUNTER — Ambulatory Visit (HOSPITAL_COMMUNITY)
Admission: RE | Admit: 2019-10-16 | Discharge: 2019-10-16 | Disposition: A | Discharge status: Home / Self Care | Payer: Medicaid - Out of State | Source: Ambulatory Visit | Attending: Hematology | Admitting: Hematology

## 2019-10-16 ENCOUNTER — Inpatient Hospital Stay (HOSPITAL_COMMUNITY): Payer: Medicaid - Out of State | Attending: Hematology

## 2019-10-16 DIAGNOSIS — C50412 Malignant neoplasm of upper-outer quadrant of left female breast: Secondary | ICD-10-CM

## 2019-10-16 DIAGNOSIS — Z780 Asymptomatic menopausal state: Secondary | ICD-10-CM | POA: Diagnosis not present

## 2019-10-16 DIAGNOSIS — Z170 Estrogen receptor positive status [ER+]: Secondary | ICD-10-CM | POA: Insufficient documentation

## 2019-10-16 LAB — CBC WITH DIFFERENTIAL/PLATELET
Abs Immature Granulocytes: 0.01 10*3/uL (ref 0.00–0.07)
Basophils Absolute: 0 10*3/uL (ref 0.0–0.1)
Basophils Relative: 1 %
Eosinophils Absolute: 0.1 10*3/uL (ref 0.0–0.5)
Eosinophils Relative: 3 %
HCT: 36.1 % (ref 36.0–46.0)
Hemoglobin: 10.9 g/dL — ABNORMAL LOW (ref 12.0–15.0)
Immature Granulocytes: 0 %
Lymphocytes Relative: 13 %
Lymphs Abs: 0.6 10*3/uL — ABNORMAL LOW (ref 0.7–4.0)
MCH: 27.6 pg (ref 26.0–34.0)
MCHC: 30.2 g/dL (ref 30.0–36.0)
MCV: 91.4 fL (ref 80.0–100.0)
Monocytes Absolute: 0.8 10*3/uL (ref 0.1–1.0)
Monocytes Relative: 17 %
Neutro Abs: 3.3 10*3/uL (ref 1.7–7.7)
Neutrophils Relative %: 66 %
Platelets: 236 10*3/uL (ref 150–400)
RBC: 3.95 MIL/uL (ref 3.87–5.11)
RDW: 14.6 % (ref 11.5–15.5)
WBC: 4.9 10*3/uL (ref 4.0–10.5)
nRBC: 0 % (ref 0.0–0.2)

## 2019-10-16 LAB — COMPREHENSIVE METABOLIC PANEL
ALT: 40 U/L (ref 0–44)
AST: 28 U/L (ref 15–41)
Albumin: 4 g/dL (ref 3.5–5.0)
Alkaline Phosphatase: 95 U/L (ref 38–126)
Anion gap: 7 (ref 5–15)
BUN: 12 mg/dL (ref 8–23)
CO2: 28 mmol/L (ref 22–32)
Calcium: 9.8 mg/dL (ref 8.9–10.3)
Chloride: 105 mmol/L (ref 98–111)
Creatinine, Ser: 0.67 mg/dL (ref 0.44–1.00)
GFR calc Af Amer: >60 mL/min (ref >60)
GFR calc non Af Amer: >60 mL/min (ref >60)
Glucose, Bld: 97 mg/dL (ref 70–99)
Potassium: 3.9 mmol/L (ref 3.5–5.1)
Sodium: 140 mmol/L (ref 135–145)
Total Bilirubin: 0.6 mg/dL (ref 0.3–1.2)
Total Protein: 7.2 g/dL (ref 6.5–8.1)

## 2019-10-16 LAB — VITAMIN D 25 HYDROXY (VIT D DEFICIENCY, FRACTURES): Vit D, 25-Hydroxy: 57.49 ng/mL (ref 30–100)

## 2019-10-19 ENCOUNTER — Ambulatory Visit (HOSPITAL_COMMUNITY): Payer: Medicaid - Out of State | Admitting: Hematology

## 2019-10-19 ENCOUNTER — Encounter (HOSPITAL_COMMUNITY): Payer: Medicaid - Out of State

## 2019-10-23 ENCOUNTER — Ambulatory Visit (HOSPITAL_COMMUNITY): Payer: Medicaid - Out of State | Admitting: Nurse Practitioner

## 2019-10-23 ENCOUNTER — Encounter (HOSPITAL_COMMUNITY): Payer: Medicaid - Out of State

## 2019-10-24 ENCOUNTER — Inpatient Hospital Stay (HOSPITAL_COMMUNITY): Payer: Medicaid - Out of State | Attending: Genetic Counselor | Admitting: Nurse Practitioner

## 2019-10-24 ENCOUNTER — Encounter (HOSPITAL_COMMUNITY): Payer: Self-pay | Admitting: Nurse Practitioner

## 2019-10-24 ENCOUNTER — Inpatient Hospital Stay (HOSPITAL_COMMUNITY): Payer: Medicaid - Out of State

## 2019-10-24 ENCOUNTER — Other Ambulatory Visit: Payer: Self-pay

## 2019-10-24 VITALS — BP 139/83 | HR 73 | Temp 97.1°F | Resp 18 | Wt 175.0 lb

## 2019-10-24 DIAGNOSIS — C50412 Malignant neoplasm of upper-outer quadrant of left female breast: Secondary | ICD-10-CM | POA: Diagnosis not present

## 2019-10-24 DIAGNOSIS — Z8249 Family history of ischemic heart disease and other diseases of the circulatory system: Secondary | ICD-10-CM | POA: Insufficient documentation

## 2019-10-24 DIAGNOSIS — Z170 Estrogen receptor positive status [ER+]: Secondary | ICD-10-CM

## 2019-10-24 DIAGNOSIS — Z823 Family history of stroke: Secondary | ICD-10-CM | POA: Diagnosis not present

## 2019-10-24 DIAGNOSIS — R200 Anesthesia of skin: Secondary | ICD-10-CM | POA: Insufficient documentation

## 2019-10-24 DIAGNOSIS — Z833 Family history of diabetes mellitus: Secondary | ICD-10-CM | POA: Insufficient documentation

## 2019-10-24 DIAGNOSIS — Z803 Family history of malignant neoplasm of breast: Secondary | ICD-10-CM | POA: Insufficient documentation

## 2019-10-24 DIAGNOSIS — Z79899 Other long term (current) drug therapy: Secondary | ICD-10-CM | POA: Diagnosis not present

## 2019-10-24 DIAGNOSIS — I10 Essential (primary) hypertension: Secondary | ICD-10-CM | POA: Diagnosis not present

## 2019-10-24 DIAGNOSIS — E119 Type 2 diabetes mellitus without complications: Secondary | ICD-10-CM | POA: Diagnosis not present

## 2019-10-24 DIAGNOSIS — Z9012 Acquired absence of left breast and nipple: Secondary | ICD-10-CM | POA: Diagnosis not present

## 2019-10-24 MED ORDER — HEPARIN SOD (PORK) LOCK FLUSH 100 UNIT/ML IV SOLN
500.0000 [IU] | Freq: Once | INTRAVENOUS | Status: AC
  Administered 2019-10-24: 500 [IU] via INTRAVENOUS

## 2019-10-24 MED ORDER — SODIUM CHLORIDE 0.9% FLUSH
10.0000 mL | INTRAVENOUS | Status: DC | PRN
  Administered 2019-10-24: 10 mL via INTRAVENOUS

## 2019-10-24 NOTE — Progress Notes (Signed)
CLINIC:  Survivorship   REASON FOR VISIT:  Routine follow-up for history of breast cancer.   BRIEF ONCOLOGIC HISTORY:  Oncology History  Malignant neoplasm of upper-outer quadrant of left breast in female, estrogen receptor positive ([place])  01/04/2019 Initial Diagnosis   Malignant neoplasm of upper-outer quadrant of left breast in female, estrogen receptor positive ([place])   01/19/2019 Cancer Staging   Staging form: Breast, AJCC 8th Edition - Pathologic stage from 01/19/2019: Stage IIA (pT2, pN1, cM0, G3, ER+, PR+, HER2-) - Signed by Derek Jack, MD on 01/19/2019   02/01/2019 -  Chemotherapy   The patient had DOXOrubicin (ADRIAMYCIN) chemo injection 120 mg, 60 mg/m2 = 120 mg, Intravenous,  Once, 4 of 4 cycles Administration: 120 mg (02/01/2019), 120 mg (02/15/2019), 120 mg (03/02/2019), 120 mg (03/15/2019) palonosetron (ALOXI) injection 0.25 mg, 0.25 mg, Intravenous,  Once, 4 of 4 cycles Administration: 0.25 mg (02/01/2019), 0.25 mg (02/15/2019), 0.25 mg (03/02/2019), 0.25 mg (03/15/2019) pegfilgrastim (NEULASTA ONPRO KIT) injection 6 mg, 6 mg, Subcutaneous, Once, 4 of 4 cycles Administration: 6 mg (02/01/2019), 6 mg (02/15/2019), 6 mg (03/02/2019), 6 mg (03/15/2019) cyclophosphamide (CYTOXAN) 1,200 mg in sodium chloride 0.9 % 250 mL chemo infusion, 600 mg/m2 = 1,200 mg, Intravenous,  Once, 4 of 4 cycles Administration: 1,200 mg (02/01/2019), 1,200 mg (02/15/2019), 1,200 mg (03/02/2019), 1,200 mg (03/15/2019) PACLitaxel (TAXOL) 162 mg in sodium chloride 0.9 % 250 mL chemo infusion (</= 4m/m2), 80 mg/m2 = 162 mg, Intravenous,  Once, 12 of 12 cycles Administration: 162 mg (03/30/2019), 162 mg (04/06/2019), 162 mg (04/13/2019), 162 mg (04/20/2019), 162 mg (04/27/2019), 162 mg (05/18/2019), 162 mg (05/25/2019), 162 mg (06/01/2019), 162 mg (06/08/2019), 162 mg (06/15/2019), 162 mg (06/22/2019), 162 mg (06/29/2019) fosaprepitant (EMEND) 150 mg, dexamethasone (DECADRON) 12 mg in sodium chloride 0.9 % 145 mL IVPB, ,  Intravenous,  Once, 4 of 4 cycles Administration:  (02/01/2019),  (02/15/2019),  (03/02/2019),  (03/15/2019)  for chemotherapy treatment.       INTERVAL HISTORY:  Bridget Richardson to the Survivorship Clinic today for routine follow-up for her history of breast cancer.  Overall, she reports feeling quite well.  Patient reports she had just finished her last radiation treatment 1 month ago.  She reports she is healing well.  She denies any extreme fatigue. Denies any nausea, vomiting, or diarrhea. Denies any new pains. Had not noticed any recent bleeding such as epistaxis, hematuria or hematochezia. Denies recent chest pain on exertion, shortness of breath on minimal exertion, pre-syncopal episodes, or palpitations. Denies any numbness or tingling in hands or feet. Denies any recent fevers, infections, or recent hospitalizations. Patient reports appetite at 100% and energy level at 100%.  She is eating well and maintain her weight at this time.     REVIEW OF SYSTEMS:  Review of Systems  Neurological: Positive for numbness.  All other systems reviewed and are negative.  Breast: Denies any new nodularity, masses, tenderness, nipple changes, or nipple discharge.       PAST MEDICAL/SURGICAL HISTORY:  Past Medical History:  Diagnosis Date  . Cancer ([facility]    stage 2 total mastctomy left breast dx march 2020  . Coronary artery disease   . Diabetes mellitus without complication ([employer]   . Family history of breast cancer   . Hypertension    Past Surgical History:  Procedure Laterality Date  . ANKLE SURGERY Right 2009  . MASTECTOMY MODIFIED RADICAL Left 01/04/2019   Procedure: MASTECTOMY MODIFIED RADICAL;  Surgeon: JAviva Signs MD;  Location: [facility];  Service: General;  Laterality: Left;  . PORTACATH PLACEMENT Right 01/30/2019   Procedure: INSERTION PORT-A-CATH;  Surgeon: Aviva Signs, MD;  Location: [facility]S;  Service: General;  Laterality: Right;  . WRIST SURGERY Right 1997      ALLERGIES:  Allergies  Allergen Reactions  . Other     No Meat- Patient is vegetarian     CURRENT MEDICATIONS:  Outpatient Encounter Medications as of 10/24/2019  Medication Sig  . anastrozole (ARIMIDEX) 1 MG tablet TAKE 1 TABLET BY MOUTH EVERY DAY  . apixaban (ELIQUIS) 5 MG TABS tablet Take 1 tablet (5 mg total) by mouth 2 (two) times daily.  Marland Kitchen atorvastatin (LIPITOR) 40 MG tablet Take 40 mg by mouth at bedtime.   . carvedilol (COREG) 6.25 MG tablet Take 1 tablet (6.25 mg total) by mouth 2 (two) times daily with a meal.  . enalapril (VASOTEC) 20 MG tablet Take 20 mg by mouth 2 (two) times daily.  . ferrous sulfate 325 (65 FE) MG tablet Take 325 mg by mouth daily with breakfast.  . metFORMIN (GLUCOPHAGE) 850 MG tablet Take 850 mg by mouth daily with breakfast.   . pantoprazole (PROTONIX) 40 MG tablet Take 1 tablet (40 mg total) by mouth daily.  . Vitamin D, Ergocalciferol, (DRISDOL) 1.25 MG (50000 UT) CAPS capsule TAKE 1 CAPSULE ORALLY ONCE A WEEK FOR 4 WEEKS THEN ONCE MONTHLY  . acetaminophen (TYLENOL) 325 MG tablet Take 2 tablets (650 mg total) by mouth every 6 (six) hours as needed for mild pain (or Fever >/= 101). (Patient not taking: Reported on 07/13/2019)  . CYCLOPHOSPHAMIDE IV Inject into the vein every 14 (fourteen) days.  Marland Kitchen lidocaine-prilocaine (EMLA) cream Apply small amount to port a cath site and cover with plastic wrap 1 hour prior to chemotherapy appointments (Patient not taking: Reported on 10/24/2019)  . PACLITAXEL IV Inject into the vein once a week.  . traZODone (DESYREL) 100 MG tablet TAKE 1 TABLET BY MOUTH EVERY DAY AT BEDTIME AS NEEDED (Patient not taking: Reported on 07/13/2019)  . [DISCONTINUED] carvedilol (COREG) 12.5 MG tablet TAKE 1 TABLET BY MOUTH ONLY ON THURSDAY   No facility-administered encounter medications on file as of 10/24/2019.     ONCOLOGIC FAMILY HISTORY:  Family History  Problem Relation Age of Onset  . Hypertension Father   . Heart  Problems Father   . Breast cancer Sister 39  . Breast cancer Maternal Aunt 39       d. 30  . Diabetes Sister   . Stroke Sister   . Aneurysm Sister   . Breast cancer Cousin 42       mets to bone at age 4    GENETIC COUNSELING/TESTING: Patient had a meeting with Roma Kayser the genetic counselor on 08/03/2019.  She did not want to fourth ago any genetic testing at this time.  SOCIAL HISTORY:  Bridget Richardson is divorced and lives alone in [place], [place]. She denies any current or history of tobacco, alcohol, or illicit drug use.     PHYSICAL EXAMINATION:  Vital Signs: Vitals:   10/24/19 1501  BP: 139/83  Pulse: 73  Resp: 18  Temp: (!) 97.1 F (36.2 C)  SpO2: 100%   Filed Weights   10/24/19 1501  Weight: 175 lb (79.4 kg)   General: Well-nourished, well-appearing female in no acute distress.  Unaccompanied today.   HEENT: Head is normocephalic.  Pupils equal and reactive to light. Conjunctivae clear without exudate.  Sclerae anicteric. Oral mucosa is  pink, moist.  Oropharynx is pink without lesions or erythema.  Lymph: No cervical, supraclavicular, or infraclavicular lymphadenopathy noted on palpation.  Cardiovascular: Regular rate and rhythm.Marland Kitchen Respiratory: Clear to auscultation bilaterally. Chest expansion symmetric; breathing non-labored.  Breast Exam:  -Left breast: No appreciable masses on palpation. No skin redness, thickening, or peau d'orange appearance; no nipple retraction or nipple discharge; mild distortion in symmetry at previous lumpectomy site well healed scar without erythema or nodularity.  -Right breast: No appreciable masses on palpation. No skin redness, thickening, or peau d'orange appearance; no nipple retraction or nipple discharge. -Axilla: No axillary adenopathy bilaterally.  GI: Abdomen soft and round; non-tender, non-distended. Bowel sounds normoactive. No hepatosplenomegaly.   GU: Deferred.  Neuro: No focal deficits. Steady gait.  Psych: Mood  and affect normal and appropriate for situation.  MSK: No focal spinal tenderness to palpation, full range of motion in bilateral upper extremities Extremities: No edema. Skin: Warm and dry.  LABORATORY DATA:  None for this visit   DIAGNOSTIC IMAGING:  Most recent mammogram: Due for her mammogram we will set this up with her next appointment.    ASSESSMENT AND PLAN:  Bridget Richardson is a pleasant 64 y.o. female with history of Stage IIA left breast invasive ductal carcinoma, ER+/PR+/HER2-, diagnosed in (March 2020), treated with lumpectomy, adjuvant radiation therapy, and anti-estrogen therapy with anastrozole beginning in (November 2020).  She presents to the Survivorship Clinic for surveillance and routine follow-up.   1. History of breast cancer:  Bridget Richardson is currently clinically and radiographically without evidence of disease or recurrence of breast cancer. She will be due for mammogram in April 2021; orders placed today.  She will return in 1 year for continue LTS follow up.  I encouraged her to call me with any questions or concerns before her next visit at the cancer center, and I would be happy to see her sooner, if needed.    #. 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  I will prescribe her gabapentin 100 mg at bedtime.  #. Bone health:  Given Bridget Richardson's age, history of breast cancer, and her current anti-estrogen therapy with anastrozole, she is at risk for bone demineralization. Her last DEXA scan was on 10/16/2019 with a T score of 0.1.  In the meantime, she was encouraged to increase her consumption of foods rich in calcium, as well as increase her weight-bearing activities.  She was given education on specific food and activities to promote bone health.  #. Cancer screening:  Due to Bridget Richardson's history and her age, she should receive screening for skin cancers, colon cancer, lung cancer, and gynecologic cancers. She was encouraged to follow-up with her PCP for  appropriate cancer screenings.   #. Health maintenance and wellness promotion: Bridget Richardson was encouraged to consume 5-7 servings of fruits and vegetables per day. She was also encouraged to engage in moderate to vigorous exercise for 30 minutes per day most days of the week. We discussed that a healthy BMI is 18.5-24.9 and that maintaining a healthy weight reduces risk of cancer recurrences.  She was instructed to limit her alcohol consumption and continue to abstain from tobacco use/was encouraged stop smoking.  Lastly, he/she was encouraged to use sunscreen and wear protective clothing when in the sun.      Dispo:  -Return to cancer center in 4 months for breast cancer follow-up. -We will order a repeat mammogram at this time. -We will also order labs.   A total  of (90) minutes of face-to-face time was spent with this patient with greater than 50% of that time in counseling and care-coordination.   Wenda Low, FNP-C Survivorship Program [employer] [phone]   Note: PRIMARY CARE PROVIDER [name], [place]

## 2019-10-24 NOTE — Progress Notes (Signed)
Bridget Richardson tolerated portacath flush well without complaints or incident. Port accessed with 20 gauge needle with blood return noted then flushed with 10 ml NS and 5 ml Heparin easily per protocol then de-accessed. Pt discharged self ambulatory in satisfactory condition

## 2019-10-24 NOTE — Patient Instructions (Signed)
[facility] at [facility] Discharge Instructions  Portacath flushed per protocol today. Follow-up as scheduled. Call clinic for any questions or concerns   Thank you for choosing [facility] at [facility] to provide your oncology and hematology care.  To afford each patient quality time with our provider, please arrive at least 15 minutes before your scheduled appointment time.   If you have a lab appointment with the Cancer Center please come in thru the Main Entrance and check in at the main information desk.  You need to re-schedule your appointment should you arrive 10 or more minutes late.  We strive to give you quality time with our providers, and arriving late affects you and other patients whose appointments are after yours.  Also, if you no show three or more times for appointments you may be dismissed from the clinic at the providers discretion.     Again, thank you for choosing [facility].  Our hope is that these requests will decrease the amount of time that you wait before being seen by our physicians.       _____________________________________________________________  Should you have questions after your visit to [facility], please contact our office at [phone] between the hours of 8:00 a.m. and 4:30 p.m.  Voicemails left after 4:00 p.m. will not be returned until the following business day.  For prescription refill requests, have your pharmacy contact our office and allow 72 hours.    Due to Covid, you will need to wear a mask upon entering the hospital. If you do not have a mask, a mask will be given to you at the Main Entrance upon arrival. For doctor visits, patients may have 1 support person with them. For treatment visits, patients can not have anyone with them due to social distancing guidelines and our immunocompromised population.     

## 2019-10-24 NOTE — Patient Instructions (Signed)
[facility] at [facility] Discharge Instructions  Follow up in 4 months with labs    Thank you for choosing [facility] at [facility] to provide your oncology and hematology care.  To afford each patient quality time with our provider, please arrive at least 15 minutes before your scheduled appointment time.   If you have a lab appointment with the Cancer Center please come in thru the Main Entrance and check in at the main information desk.  You need to re-schedule your appointment should you arrive 10 or more minutes late.  We strive to give you quality time with our providers, and arriving late affects you and other patients whose appointments are after yours.  Also, if you no show three or more times for appointments you may be dismissed from the clinic at the providers discretion.     Again, thank you for choosing [facility].  Our hope is that these requests will decrease the amount of time that you wait before being seen by our physicians.       _____________________________________________________________  Should you have questions after your visit to [facility], please contact our office at [phone] between the hours of 8:00 a.m. and 4:30 p.m.  Voicemails left after 4:00 p.m. will not be returned until the following business day.  For prescription refill requests, have your pharmacy contact our office and allow 72 hours.    Due to Covid, you will need to wear a mask upon entering the hospital. If you do not have a mask, a mask will be given to you at the Main Entrance upon arrival. For doctor visits, patients may have 1 support person with them. For treatment visits, patients can not have anyone with them due to social distancing guidelines and our immunocompromised population.      

## 2019-10-25 ENCOUNTER — Other Ambulatory Visit (HOSPITAL_COMMUNITY): Payer: Self-pay | Admitting: Nurse Practitioner

## 2019-10-25 ENCOUNTER — Encounter (HOSPITAL_COMMUNITY): Payer: Self-pay | Admitting: *Deleted

## 2019-10-25 DIAGNOSIS — G629 Polyneuropathy, unspecified: Secondary | ICD-10-CM

## 2019-10-25 MED ORDER — GABAPENTIN 100 MG PO CAPS: 100.0000 mg | ORAL_CAPSULE | Freq: Every day | ORAL | 3 refills | Status: AC

## 2019-12-04 ENCOUNTER — Encounter (HOSPITAL_COMMUNITY): Payer: Self-pay | Admitting: *Deleted

## 2020-01-12 ENCOUNTER — Other Ambulatory Visit (HOSPITAL_COMMUNITY): Payer: Self-pay | Admitting: *Deleted

## 2020-03-12 ENCOUNTER — Inpatient Hospital Stay (HOSPITAL_COMMUNITY): Payer: Medicaid - Out of State

## 2020-03-12 ENCOUNTER — Inpatient Hospital Stay (HOSPITAL_COMMUNITY): Payer: Medicaid - Out of State | Attending: Hematology | Admitting: Hematology

## 2020-03-12 ENCOUNTER — Other Ambulatory Visit: Payer: Self-pay

## 2020-03-12 VITALS — BP 157/74 | HR 64 | Temp 96.9°F | Resp 18 | Wt 183.8 lb

## 2020-03-12 DIAGNOSIS — Z79899 Other long term (current) drug therapy: Secondary | ICD-10-CM | POA: Insufficient documentation

## 2020-03-12 DIAGNOSIS — Z79811 Long term (current) use of aromatase inhibitors: Secondary | ICD-10-CM | POA: Insufficient documentation

## 2020-03-12 DIAGNOSIS — C50412 Malignant neoplasm of upper-outer quadrant of left female breast: Secondary | ICD-10-CM

## 2020-03-12 DIAGNOSIS — Z170 Estrogen receptor positive status [ER+]: Secondary | ICD-10-CM

## 2020-03-12 DIAGNOSIS — Z803 Family history of malignant neoplasm of breast: Secondary | ICD-10-CM | POA: Diagnosis not present

## 2020-03-12 DIAGNOSIS — Z86711 Personal history of pulmonary embolism: Secondary | ICD-10-CM | POA: Insufficient documentation

## 2020-03-12 DIAGNOSIS — R200 Anesthesia of skin: Secondary | ICD-10-CM | POA: Insufficient documentation

## 2020-03-12 DIAGNOSIS — Z7901 Long term (current) use of anticoagulants: Secondary | ICD-10-CM | POA: Insufficient documentation

## 2020-03-12 DIAGNOSIS — R202 Paresthesia of skin: Secondary | ICD-10-CM | POA: Diagnosis not present

## 2020-03-12 LAB — IRON AND TIBC
Iron: 66 ug/dL (ref 28–170)
Saturation Ratios: 19 % (ref 10.4–31.8)
TIBC: 346 ug/dL (ref 250–450)
UIBC: 280 ug/dL

## 2020-03-12 LAB — CBC WITH DIFFERENTIAL/PLATELET
Abs Immature Granulocytes: 0.02 10*3/uL (ref 0.00–0.07)
Basophils Absolute: 0 10*3/uL (ref 0.0–0.1)
Basophils Relative: 1 %
Eosinophils Absolute: 0.2 10*3/uL (ref 0.0–0.5)
Eosinophils Relative: 3 %
HCT: 35.1 % — ABNORMAL LOW (ref 36.0–46.0)
Hemoglobin: 10.8 g/dL — ABNORMAL LOW (ref 12.0–15.0)
Immature Granulocytes: 0 %
Lymphocytes Relative: 23 %
Lymphs Abs: 1.3 10*3/uL (ref 0.7–4.0)
MCH: 28.2 pg (ref 26.0–34.0)
MCHC: 30.8 g/dL (ref 30.0–36.0)
MCV: 91.6 fL (ref 80.0–100.0)
Monocytes Absolute: 0.9 10*3/uL (ref 0.1–1.0)
Monocytes Relative: 15 %
Neutro Abs: 3.3 10*3/uL (ref 1.7–7.7)
Neutrophils Relative %: 58 %
Platelets: 233 10*3/uL (ref 150–400)
RBC: 3.83 MIL/uL — ABNORMAL LOW (ref 3.87–5.11)
RDW: 13.7 % (ref 11.5–15.5)
WBC: 5.7 10*3/uL (ref 4.0–10.5)
nRBC: 0 % (ref 0.0–0.2)

## 2020-03-12 LAB — COMPREHENSIVE METABOLIC PANEL
ALT: 17 U/L (ref 0–44)
AST: 16 U/L (ref 15–41)
Albumin: 3.9 g/dL (ref 3.5–5.0)
Alkaline Phosphatase: 97 U/L (ref 38–126)
Anion gap: 10 (ref 5–15)
BUN: 11 mg/dL (ref 8–23)
CO2: 26 mmol/L (ref 22–32)
Calcium: 9.9 mg/dL (ref 8.9–10.3)
Chloride: 106 mmol/L (ref 98–111)
Creatinine, Ser: 0.71 mg/dL (ref 0.44–1.00)
GFR calc Af Amer: >60 mL/min (ref >60)
GFR calc non Af Amer: >60 mL/min (ref >60)
Glucose, Bld: 92 mg/dL (ref 70–99)
Potassium: 4.5 mmol/L (ref 3.5–5.1)
Sodium: 142 mmol/L (ref 135–145)
Total Bilirubin: 0.5 mg/dL (ref 0.3–1.2)
Total Protein: 7.4 g/dL (ref 6.5–8.1)

## 2020-03-12 LAB — VITAMIN B12: Vitamin B-12: 493 pg/mL (ref 180–914)

## 2020-03-12 LAB — FOLATE: Folate: 8.5 ng/mL (ref >5.9)

## 2020-03-12 LAB — LACTATE DEHYDROGENASE: LDH: 147 U/L (ref 98–192)

## 2020-03-12 LAB — TSH: TSH: 0.892 u[IU]/mL (ref 0.350–4.500)

## 2020-03-12 LAB — FERRITIN: Ferritin: 269 ng/mL (ref 11–307)

## 2020-03-12 LAB — VITAMIN D 25 HYDROXY (VIT D DEFICIENCY, FRACTURES): Vit D, 25-Hydroxy: 67.12 ng/mL (ref 30–100)

## 2020-03-12 MED ORDER — SODIUM CHLORIDE 0.9% FLUSH
10.0000 mL | Freq: Once | INTRAVENOUS | Status: AC
  Administered 2020-03-12: 10 mL via INTRAVENOUS

## 2020-03-12 MED ORDER — HEPARIN SOD (PORK) LOCK FLUSH 100 UNIT/ML IV SOLN
500.0000 [IU] | Freq: Once | INTRAVENOUS | Status: AC
  Administered 2020-03-12: 500 [IU] via INTRAVENOUS

## 2020-03-12 NOTE — Patient Instructions (Signed)
[place] at [facility] Discharge Instructions  You were seen today by [name]. He went over your recent results. Continue with your yearly mammograms. You will be referred to medical oncologists in [place]. You can schedule a visit with [name] if needed.   Thank you for choosing [place] at [facility] to provide your oncology and hematology care.  To afford each patient quality time with our provider, please arrive at least 15 minutes before your scheduled appointment time.   If you have a lab appointment with the [facility] please come in thru the Main Entrance and check in at the main information desk  You need to re-schedule your appointment should you arrive 10 or more minutes late.  We strive to give you quality time with our providers, and arriving late affects you and other patients whose appointments are after yours.  Also, if you no show three or more times for appointments you may be dismissed from the clinic at the providers discretion.     Again, thank you for choosing [facility].  Our hope is that these requests will decrease the amount of time that you wait before being seen by our physicians.       _____________________________________________________________  Should you have questions after your visit to [facility], please contact our office at [phone] between the hours of 8:00 a.m. and 4:30 p.m.  Voicemails left after 4:00 p.m. will not be returned until the following business day.  For prescription refill requests, have your pharmacy contact our office and allow 72 hours.    Cancer Center Support Programs:   > Cancer Support Group  2nd Tuesday of the month 1pm-2pm, Journey Room

## 2020-03-12 NOTE — Progress Notes (Signed)
Bridget Richardson, [place] [address]   Patient Care Team: [name], MD as PCP - General (Family Medicine)  SUMMARY OF ONCOLOGIC HISTORY: Oncology History  Malignant neoplasm of upper-outer quadrant of left breast in female, estrogen receptor positive ([place])  01/04/2019 Initial Diagnosis   Malignant neoplasm of upper-outer quadrant of left breast in female, estrogen receptor positive ([place])   01/19/2019 Cancer Staging   Staging form: Breast, AJCC 8th Edition - Pathologic stage from 01/19/2019: Stage IIA (pT2, pN1, cM0, G3, ER+, PR+, HER2-) - Signed by Derek Jack, MD on 01/19/2019   02/01/2019 -  Chemotherapy   The patient had DOXOrubicin (ADRIAMYCIN) chemo injection 120 mg, 60 mg/m2 = 120 mg, Intravenous,  Once, 4 of 4 cycles Administration: 120 mg (02/01/2019), 120 mg (02/15/2019), 120 mg (03/02/2019), 120 mg (03/15/2019) palonosetron (ALOXI) injection 0.25 mg, 0.25 mg, Intravenous,  Once, 4 of 4 cycles Administration: 0.25 mg (02/01/2019), 0.25 mg (02/15/2019), 0.25 mg (03/02/2019), 0.25 mg (03/15/2019) pegfilgrastim (NEULASTA ONPRO KIT) injection 6 mg, 6 mg, Subcutaneous, Once, 4 of 4 cycles Administration: 6 mg (02/01/2019), 6 mg (02/15/2019), 6 mg (03/02/2019), 6 mg (03/15/2019) cyclophosphamide (CYTOXAN) 1,200 mg in sodium chloride 0.9 % 250 mL chemo infusion, 600 mg/m2 = 1,200 mg, Intravenous,  Once, 4 of 4 cycles Administration: 1,200 mg (02/01/2019), 1,200 mg (02/15/2019), 1,200 mg (03/02/2019), 1,200 mg (03/15/2019) PACLitaxel (TAXOL) 162 mg in sodium chloride 0.9 % 250 mL chemo infusion (</= 72m/m2), 80 mg/m2 = 162 mg, Intravenous,  Once, 12 of 12 cycles Administration: 162 mg (03/30/2019), 162 mg (04/06/2019), 162 mg (04/13/2019), 162 mg (04/20/2019), 162 mg (04/27/2019), 162 mg (05/18/2019), 162 mg (05/25/2019), 162 mg (06/01/2019), 162 mg (06/08/2019), 162 mg (06/15/2019), 162 mg (06/22/2019), 162 mg (06/29/2019) fosaprepitant (EMEND) 150 mg, dexamethasone  (DECADRON) 12 mg in sodium chloride 0.9 % 145 mL IVPB, , Intravenous,  Once, 4 of 4 cycles Administration:  (02/01/2019),  (02/15/2019),  (03/02/2019),  (03/15/2019)  for chemotherapy treatment.      CHIEF COMPLIANT: Follow-up for left breast cancer   INTERVAL HISTORY: Bridget Richardson a 64y.o. female here today for follow up of her left breast cancer. Her last visit was on 07/13/2019.   Today Bridget Richardson reports doing well. [place] JOrie Rout Bridget Richardson is tolerating the Arimidex well. The gabapentin is slowly helping her tingling in her feet. Bridget Richardson denies any bleeding with Eliquis. Bridget Richardson has not done genetic testing yet.  Bridget Richardson is retired now. Bridget Richardson walks daily and some weight training. Bridget Richardson has received her second [place].   REVIEW OF SYSTEMS:   Review of Systems  Constitutional: Negative for appetite change and fatigue.  HENT:   Negative for nosebleeds.   Gastrointestinal: Negative for blood in stool.  Genitourinary: Negative for hematuria.   Skin: Negative for rash.  Neurological: Positive for numbness (tingling in feet).  All other systems reviewed and are negative.   I have reviewed the past medical history, past surgical history, social history and family history with the patient and they are unchanged from previous note.   ALLERGIES:   is allergic to other.   MEDICATIONS:  Current Outpatient Medications  Medication Sig Dispense Refill  . anastrozole (ARIMIDEX) 1 MG tablet TAKE 1 TABLET BY MOUTH EVERY DAY 90 tablet 2  . apixaban (ELIQUIS) 5 MG TABS tablet Take 1 tablet (5 mg total) by mouth 2 (two) times daily. 180 tablet 3  . atorvastatin (LIPITOR)  40 MG tablet Take 40 mg by mouth at bedtime.     . carvedilol (COREG) 6.25 MG tablet Take 1 tablet (6.25 mg total) by mouth 2 (two) times daily with a meal. 60 tablet 5  . enalapril (VASOTEC) 20 MG tablet Take 20 mg by mouth 2 (two) times daily.    . ferrous sulfate 325 (65  FE) MG tablet Take 325 mg by mouth daily with breakfast.    . gabapentin (NEURONTIN) 100 MG capsule Take 1 capsule (100 mg total) by mouth at bedtime. 30 capsule 3  . metFORMIN (GLUCOPHAGE) 850 MG tablet Take 850 mg by mouth daily with breakfast.     . pantoprazole (PROTONIX) 40 MG tablet Take 1 tablet (40 mg total) by mouth daily. 30 tablet 1  . Vitamin D, Ergocalciferol, (DRISDOL) 1.25 MG (50000 UT) CAPS capsule TAKE 1 CAPSULE ORALLY ONCE A WEEK FOR 4 WEEKS THEN ONCE MONTHLY    . acetaminophen (TYLENOL) 325 MG tablet Take 2 tablets (650 mg total) by mouth every 6 (six) hours as needed for mild pain (or Fever >/= 101). (Patient not taking: Reported on 07/13/2019) 12 tablet 0  . CYCLOPHOSPHAMIDE IV Inject into the vein every 14 (fourteen) days. (Patient not taking: Reported on 03/12/2020)    . lidocaine-prilocaine (EMLA) cream Apply small amount to port a cath site and cover with plastic wrap 1 hour prior to chemotherapy appointments (Patient not taking: Reported on 10/24/2019) 30 g 2  . PACLITAXEL IV Inject into the vein once a week. (Patient not taking: Reported on 03/12/2020)    . traZODone (DESYREL) 100 MG tablet TAKE 1 TABLET BY MOUTH EVERY DAY AT BEDTIME AS NEEDED (Patient not taking: Reported on 07/13/2019) 90 tablet 1   No current facility-administered medications for this visit.     PHYSICAL EXAMINATION: Performance status (ECOG): 0 - Asymptomatic  Vitals:   03/12/20 1435  BP: (!) 157/74  Pulse: 64  Resp: 18  Temp: (!) 96.9 F (36.1 C)  SpO2: 100%   Wt Readings from Last 3 Encounters:  03/12/20 183 lb 12.8 oz (83.4 kg)  10/24/19 175 lb (79.4 kg)  07/13/19 173 lb (78.5 kg)   Physical Exam Vitals reviewed.  Constitutional:      Appearance: Normal appearance. Bridget Richardson is obese.  Cardiovascular:     Rate and Rhythm: Normal rate and regular rhythm.     Pulses: Normal pulses.     Heart sounds: Normal heart sounds.  Pulmonary:     Effort: Pulmonary effort is normal.     Breath  sounds: Normal breath sounds.  Chest:     Breasts:        Right: Normal. No tenderness.        Left: Absent. No tenderness.     Comments: Port on R chest Abdominal:     Palpations: Abdomen is soft. There is no hepatomegaly or mass.     Tenderness: There is no abdominal tenderness.     Hernia: No hernia is present.  Musculoskeletal:     Right lower leg: No edema.     Left lower leg: No edema.  Lymphadenopathy:     Upper Body:     Right upper body: No supraclavicular or axillary adenopathy.     Left upper body: No supraclavicular or axillary adenopathy.     Lower Body: No right inguinal adenopathy. No left inguinal adenopathy.  Neurological:     General: No focal deficit present.     Mental Status: Bridget Richardson is alert and  oriented to person, place, and time.  Psychiatric:        Mood and Affect: Mood normal.        Behavior: Behavior normal.     Breast Exam Chaperone: Milinda Antis, MD     LABORATORY DATA:  I have reviewed the data as listed CMP Latest Ref Rng & Units 10/16/2019 07/13/2019 06/29/2019  Glucose 70 - 99 mg/dL 97 95 108(H)  BUN 8 - 23 mg/dL _0 Creatinine 0.44 - 1.00 mg/dL 0.67 0.55 0.49  Sodium 135 - 145 mmol/L 140 139 142  Potassium 3.5 - 5.1 mmol/L 3.9 4.1 3.8  Chloride 98 - 111 mmol/L 105 105 107  CO2 22 - 32 mmol/L _1 Calcium 8.9 - 10.3 mg/dL 9.8 10.2 9.7  Total Protein 6.5 - 8.1 g/dL 7.2 7.2 6.6  Total Bilirubin 0.3 - 1.2 mg/dL 0.6 0.7 0.2(L)  Alkaline Phos 38 - 126 U/L 95 103 65  AST 15 - 41 U/L _2 ALT 0 - 44 U/L 40 30 23   No results found for: [id number] Lab Results  Component Value Date   WBC 5.7 03/12/2020   HGB 10.8 (L) 03/12/2020   HCT 35.1 (L) 03/12/2020   MCV 91.6 03/12/2020   PLT 233 03/12/2020   NEUTROABS 3.3 03/12/2020   Lab Results  Component Value Date   TIBC 342 02/01/2019   FERRITIN 251 02/01/2019   IRONPCTSAT 14 02/01/2019  No results found for: LDH Lab Results  Component Value Date   VD25OH 57.49 10/16/2019        ASSESSMENT:  1.  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 -4 cycles of dose dense AC followed by 12 weeks of paclitaxel from 02/01/2019 through 06/29/2019. -XRT delivered by Dr. Osborne Casco in [place]. -Anastrozole started in December 2020.  2.  Unprovoked pulmonary embolism: -CT scan on 05/13/2019 showed submassive pulmonary embolism with right heart strain by CT evidence.  This happened while Bridget Richardson was receiving chemotherapy in the adjuvant setting while Bridget Richardson was working. -Bridget Richardson was started on Eliquis.  3.  Family history: -Sister had metastatic breast cancer at age 92. -Bridget Richardson met with our geneticist and refused genetic testing.   PLAN:  1.  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  Right breast has no palpable masses. -Bridget Richardson is scheduled for right breast mammogram at [facility] on 04/08/2020.  Bridget Richardson will continue anastrozole which Bridget Richardson is tolerating well.  -Bridget Richardson would like to switch her care locally.  I will make a referral to Drs.Madan/ Charlsie Merles in [place].  2.  Unprovoked pulmonary embolism: -Bridget Richardson is tolerating Eliquis very well without any bleeding issues.  Given the unprovoked nature, I have recommended long-term anticoagulation.  3.  Family history: -Bridget Richardson was encouraged to have genetic testing done locally in [place].   Breast Cancer therapy associated bone loss: I have recommended calcium, Vitamin D and weight bearing exercises.   No orders of the defined types were placed in this encounter.  The patient has a good understanding of the overall plan. Bridget Richardson agrees with it. Bridget Richardson will call with any problems that may develop before the next visit here.    Derek Jack,  MD [facility] [phone]   I, Milinda Antis, am acting as a scribe for Dr. Sanda Linger.  I, Derek Jack MD, have reviewed the above documentation for accuracy and completeness, and I agree with the above.

## 2020-03-19 ENCOUNTER — Encounter (HOSPITAL_COMMUNITY): Payer: Self-pay

## 2020-05-12 IMAGING — DX CHEST - 2 VIEW
2 series · 2 of 2 positions shown · non-contrast
Comparison: None.

CLINICAL DATA: 63-year-old female with LEFT breast cancer.

EXAM:
CHEST - 2 VIEW

[chest pa]
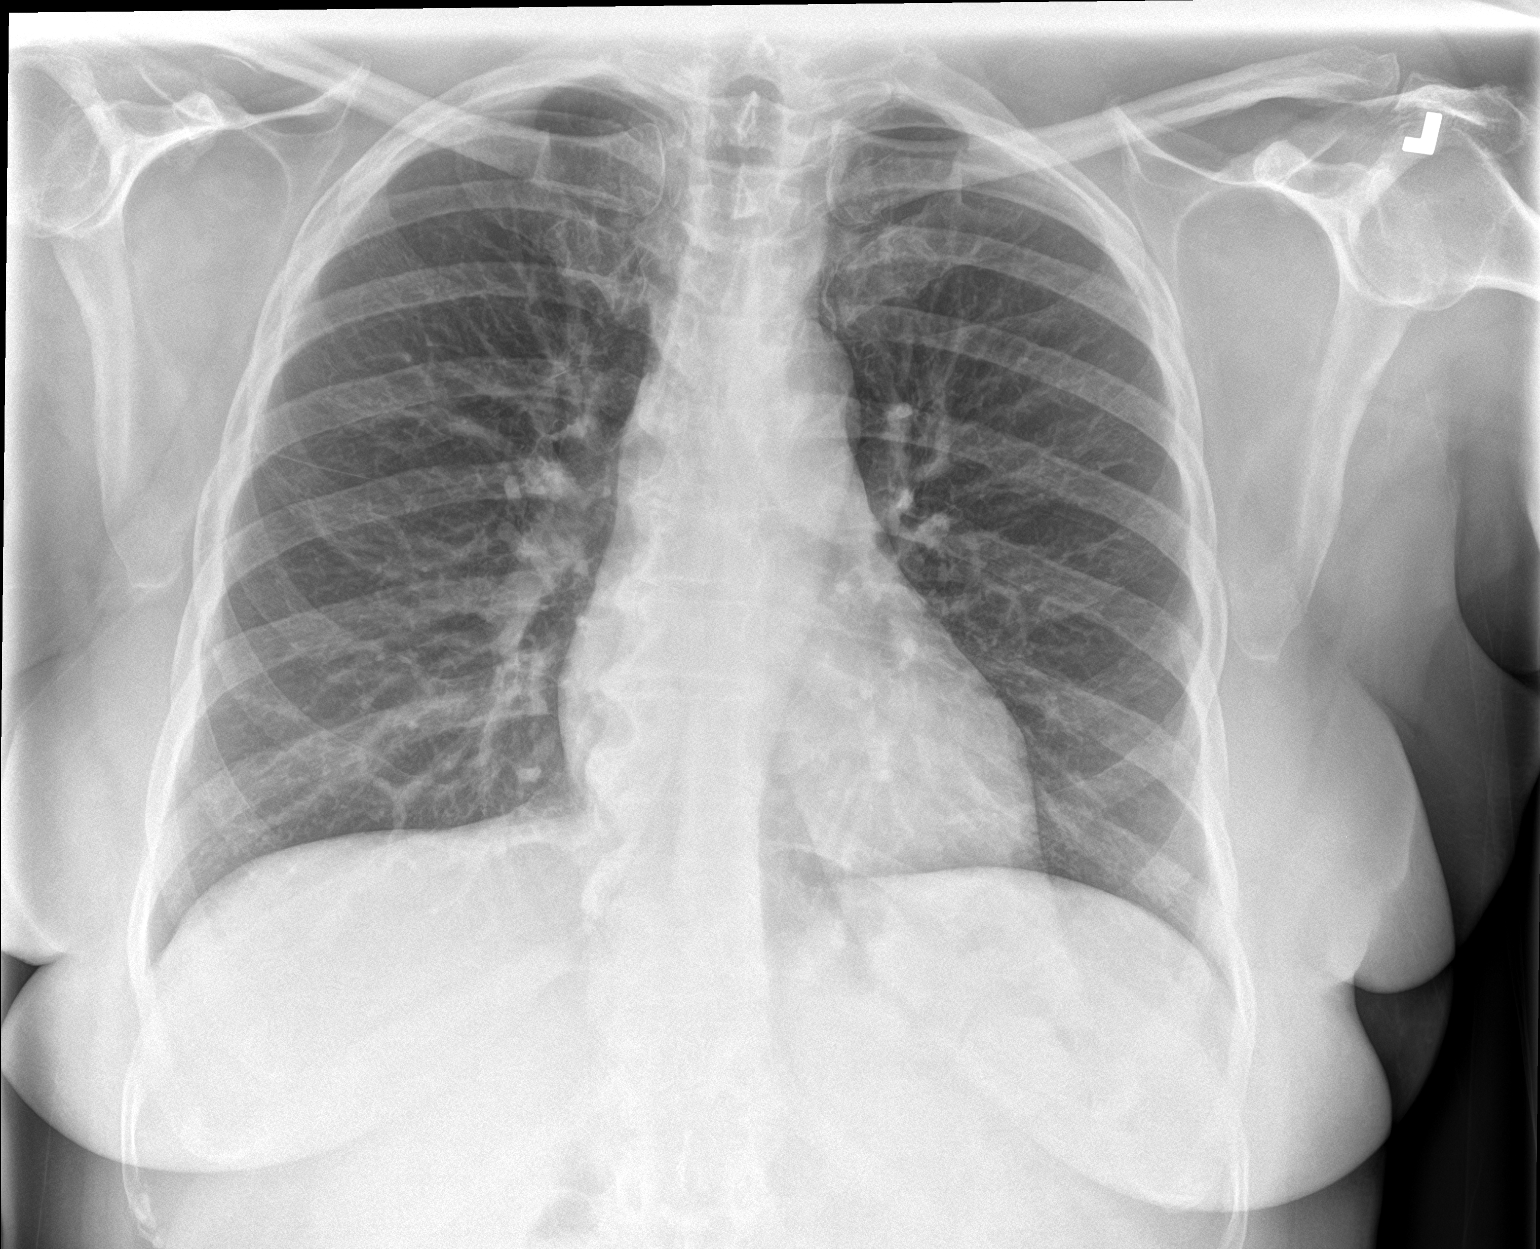

[chest lat]
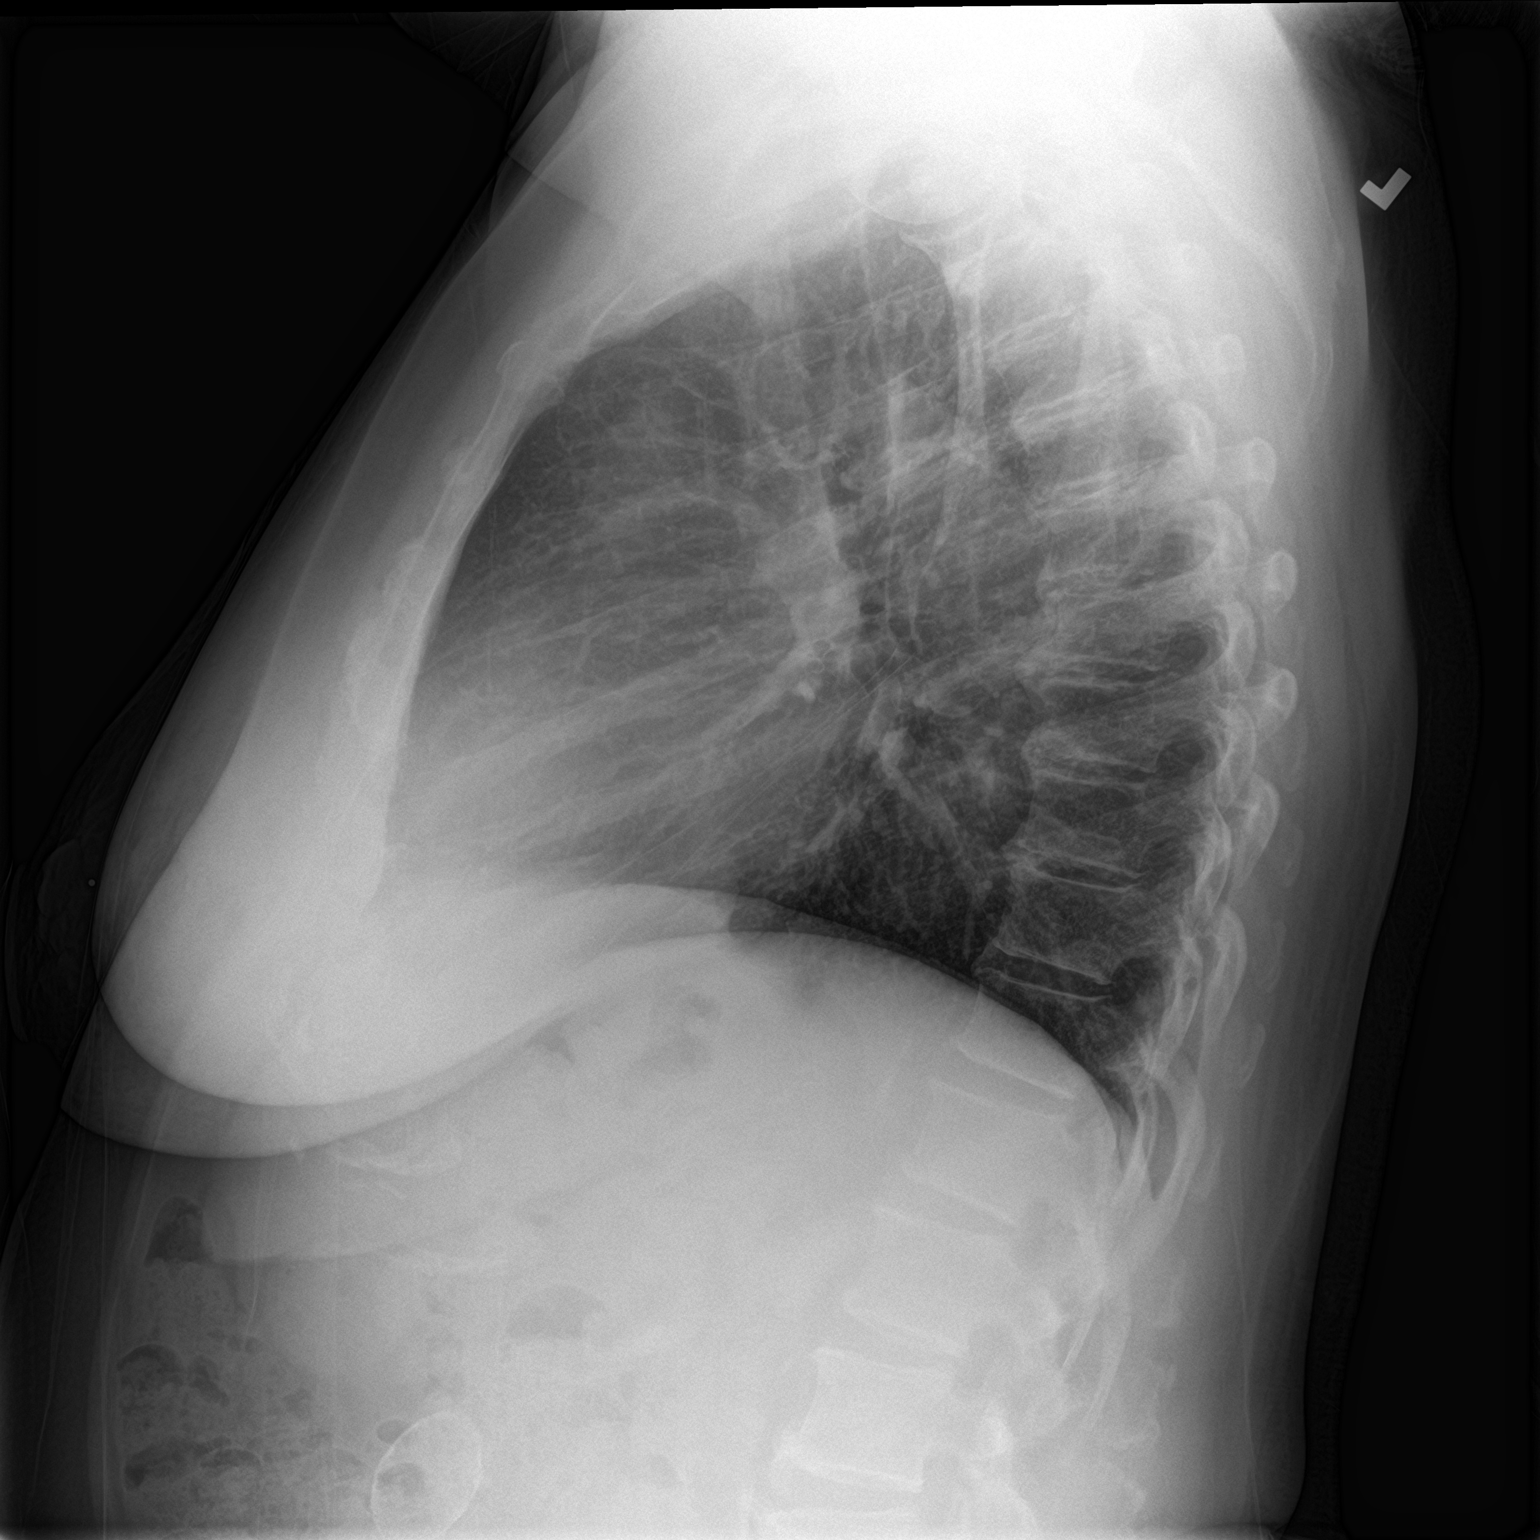

[2 of 2 positions shown; findings below may reference images not displayed]

FINDINGS: The cardiomediastinal silhouette is unremarkable.

There is no evidence of focal airspace disease, pulmonary edema,
suspicious pulmonary nodule/mass, pleural effusion, or pneumothorax.

No acute bony abnormalities are identified.

On the LATERAL view, an indeterminate 2.5 x 4 cm rim calcified
structure is identified overlying the abdomen.
IMPRESSION: 1. No evidence of metastatic disease or active cardiopulmonary
disease.
2. 2.5 x 4 cm indeterminate rim calcified structure overlying the
abdomen on the LATERAL view. This could represent a calcified
gallstone but consider further evaluation if no prior outside
studies are available for comparison.

## 2020-05-20 ENCOUNTER — Other Ambulatory Visit (HOSPITAL_COMMUNITY): Payer: Self-pay

## 2020-05-20 DIAGNOSIS — Z170 Estrogen receptor positive status [ER+]: Secondary | ICD-10-CM

## 2020-05-20 DIAGNOSIS — C50412 Malignant neoplasm of upper-outer quadrant of left female breast: Secondary | ICD-10-CM

## 2020-05-20 MED ORDER — APIXABAN 5 MG PO TABS: 5.0000 mg | ORAL_TABLET | Freq: Two times a day (BID) | ORAL | 3 refills | Status: DC

## 2020-05-23 ENCOUNTER — Other Ambulatory Visit (HOSPITAL_COMMUNITY): Payer: Self-pay

## 2020-05-23 DIAGNOSIS — C50412 Malignant neoplasm of upper-outer quadrant of left female breast: Secondary | ICD-10-CM

## 2020-05-23 DIAGNOSIS — Z170 Estrogen receptor positive status [ER+]: Secondary | ICD-10-CM

## 2020-05-23 MED ORDER — APIXABAN 5 MG PO TABS: 5.0000 mg | ORAL_TABLET | Freq: Two times a day (BID) | ORAL | 3 refills | Status: DC

## 2020-05-23 MED ORDER — APIXABAN 5 MG PO TABS: 5.0000 mg | ORAL_TABLET | Freq: Two times a day (BID) | ORAL | 3 refills | Status: AC

## 2020-06-01 ENCOUNTER — Other Ambulatory Visit (HOSPITAL_COMMUNITY): Payer: Self-pay | Admitting: Hematology

## 2020-06-01 DIAGNOSIS — Z170 Estrogen receptor positive status [ER+]: Secondary | ICD-10-CM

## 2020-06-01 DIAGNOSIS — C50412 Malignant neoplasm of upper-outer quadrant of left female breast: Secondary | ICD-10-CM

## 2020-09-14 IMAGING — CT CT ANGIO CHEST
2 of 6 series · 18 of 46 positions shown · IV contrast (omnipaque)
Comparison: None.

CLINICAL DATA: Shortness of breath, fever. History of left breast
carcinoma. High pretest probability of pulmonary emboli.

EXAM:
CT ANGIOGRAPHY CHEST WITH CONTRAST
TECHNIQUE: Multidetector CT imaging of the chest was performed using the
standard protocol during bolus administration of intravenous
contrast. Multiplanar CT image reconstructions and MIPs were
obtained to evaluate the vascular anatomy.
CONTRAST:  100mL OMNIPAQUE IOHEXOL 350 MG/ML SOLN

[Series 4: pe axial thins · axial · 0.60mm/px · z∈[-456,-185]mm · 15 of 297 slices shown]
[im 13/297  lung]
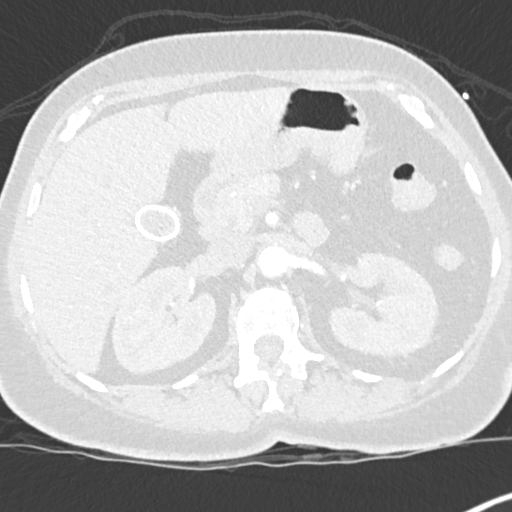
[im 39/297  soft-tissue]
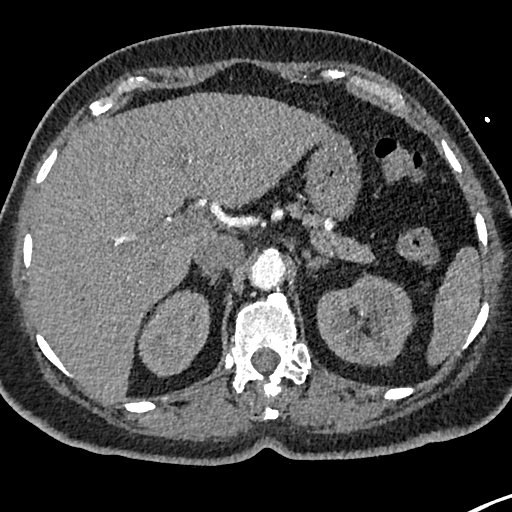
[im 52/297  lung]
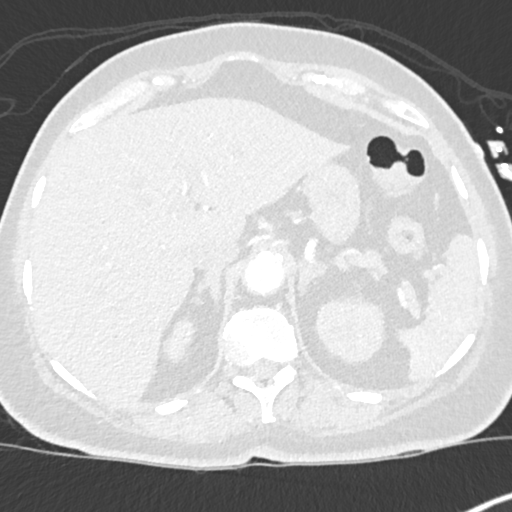
[im 78/297  soft-tissue]
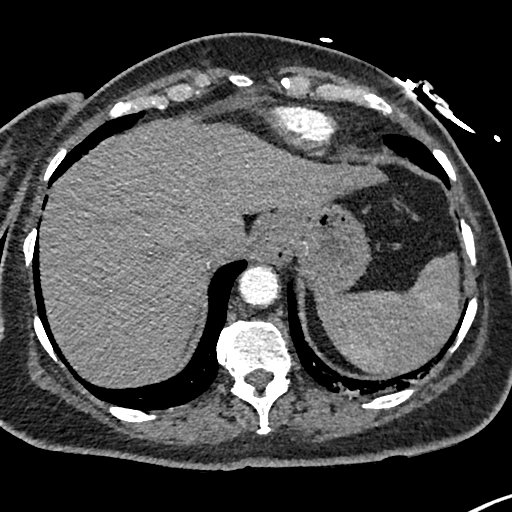
[im 91/297  lung]
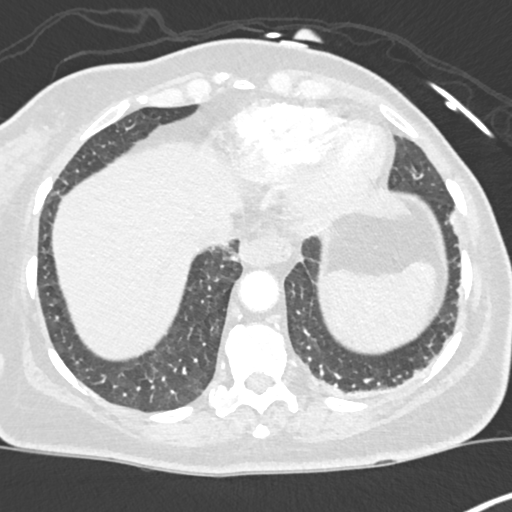
[im 116/297  soft-tissue]
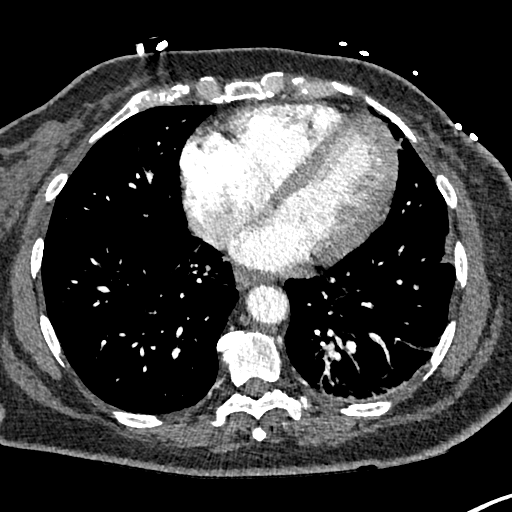
[im 129/297  lung]
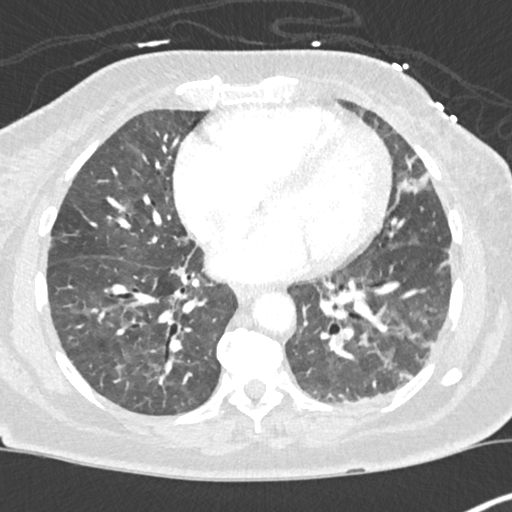
[im 155/297  soft-tissue]
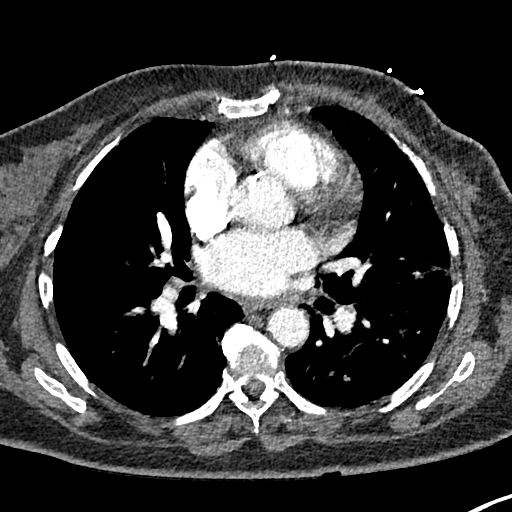
[im 168/297  lung]
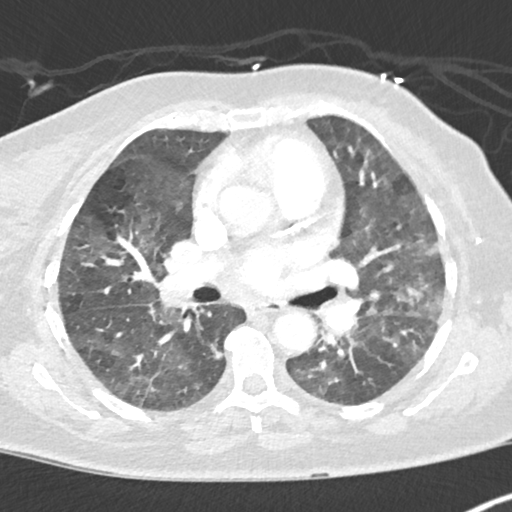
[im 181/297  soft-tissue]
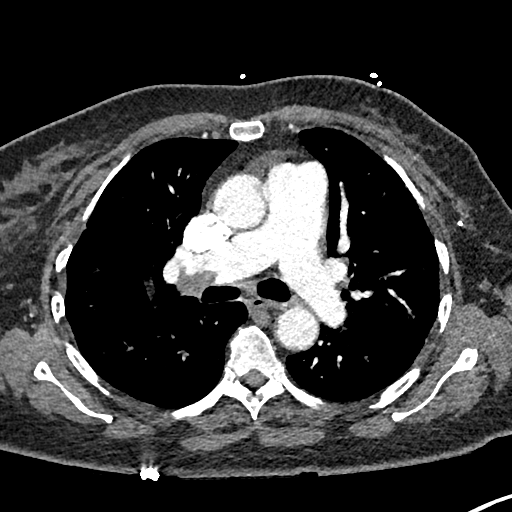
[im 206/297  lung]
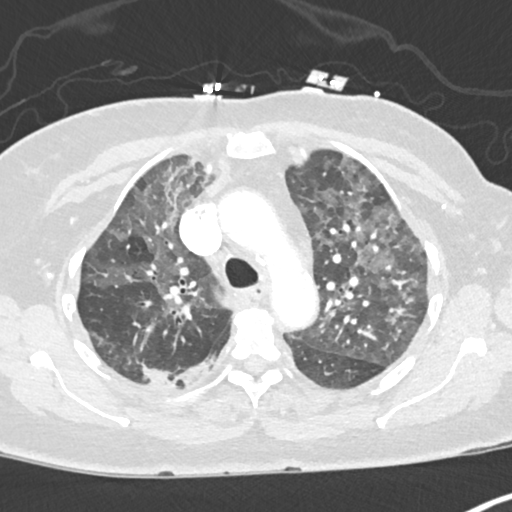
[im 219/297  soft-tissue]
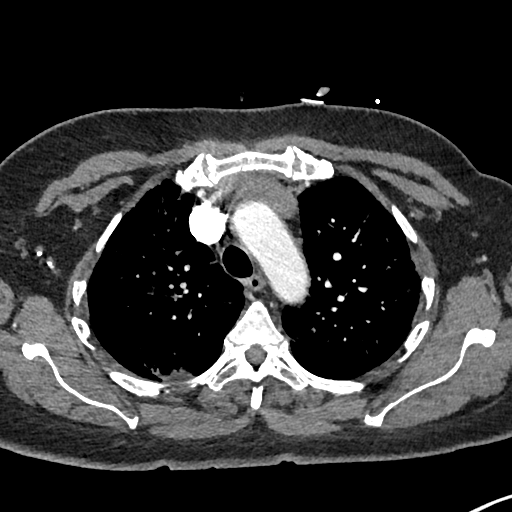
[im 245/297  lung]
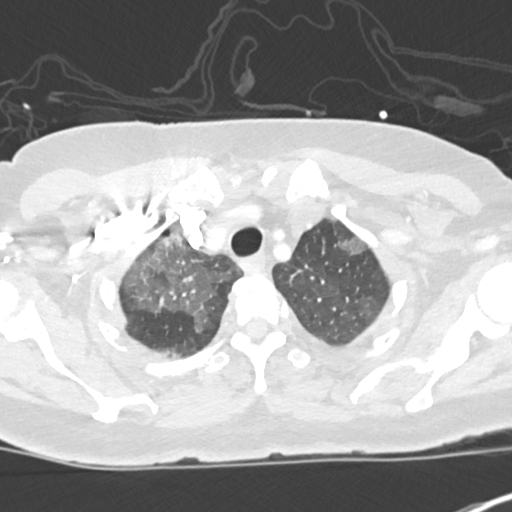
[im 258/297  soft-tissue]
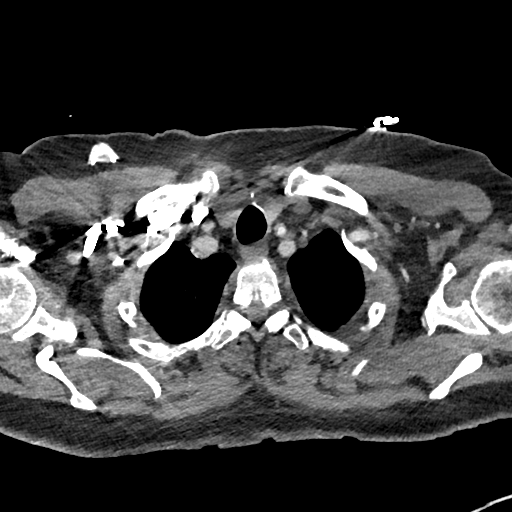
[im 284/297  lung]
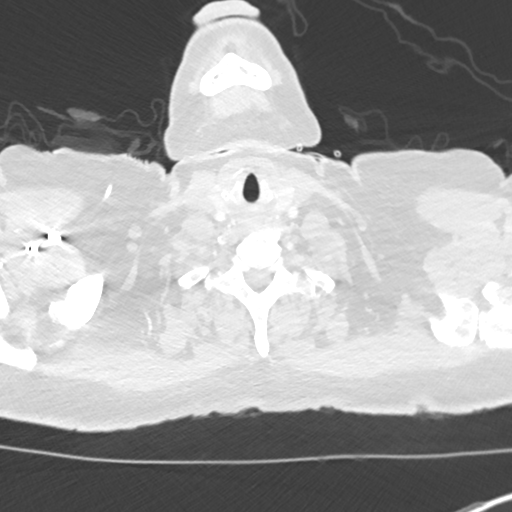

[Series 7: cor soft · coronal · 0.60mm/px · 3 of 151 slices shown]
[im 38/151  soft-tissue]
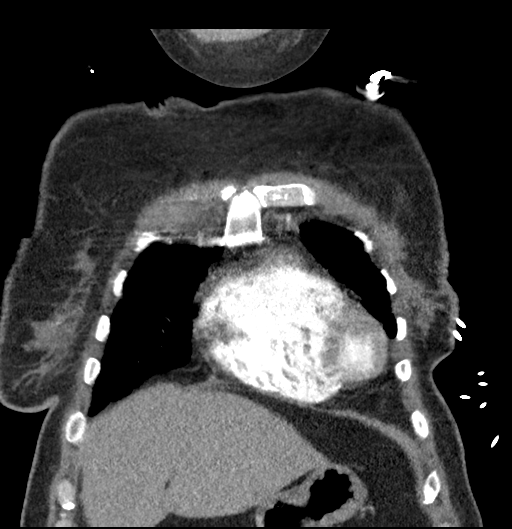
[im 76/151  soft-tissue]
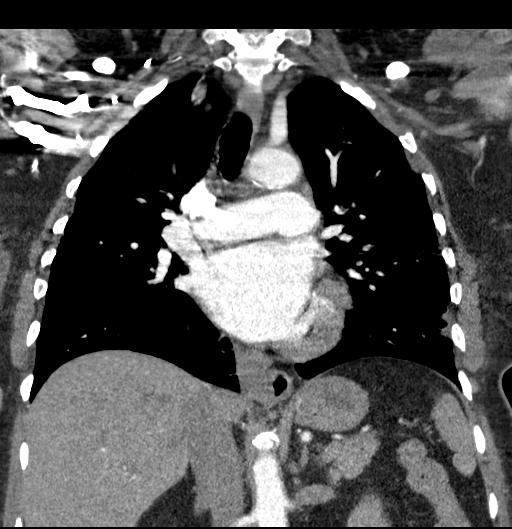
[im 113/151  soft-tissue]
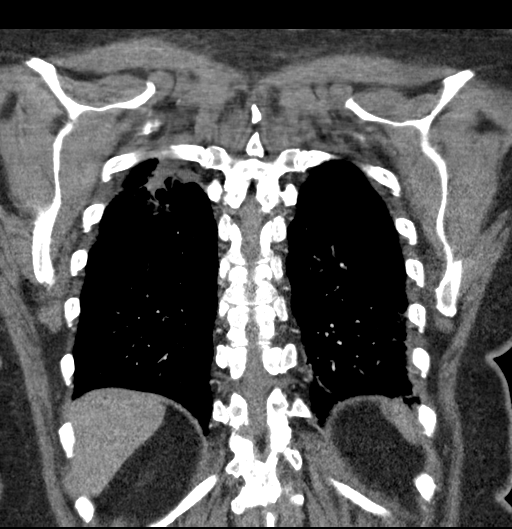

[18 of 46 positions shown; findings below may reference images not displayed]

FINDINGS: Cardiovascular: Heart size upper limits normal. No pericardial
effusion. Right subclavian port catheter to the distal SVC. RV/LV
ratio 1.2. Dilated central pulmonary arteries. There is a central
embolus in the distal right pulmonary artery extending into the
upper and lower lobe branches. At least 2 incompletely occlusive
left lower lobe segmental emboli extending into subsegmental
branches.

Moderate coronary calcifications. Adequate contrast opacification of
the thoracic aorta with no evidence of dissection, aneurysm, or
stenosis. There is classic 3-vessel brachiocephalic arch anatomy
without proximal stenosis. Aortic Atherosclerosis (FCFV5-170.0).

Mediastinum/Nodes: Small hiatal hernia. No hilar or mediastinal
adenopathy.

Lungs/Pleura: No pleural effusion. No pneumothorax. Patchy airspace
consolidation in the superior segment right lower lobe. Somewhat
geographic ground-glass opacities in the upper lobes bilaterally.

Upper Abdomen: 2.8 cm peripherally calcified stone in the
non-distended gallbladder. Small hiatal hernia. No acute findings.

Musculoskeletal: Skin thickening and subcutaneous
inflammatory/edematous change in the left anterior chest wall
suggesting prior radiation therapy. Anterior vertebral endplate
spurring at multiple levels in the mid and lower thoracic spine.
Bilateral shoulder DJD. No fracture or worrisome bone lesion.
IMPRESSION: 1. POSITIVE for acute PE with CT evidence of right heart strain
(RV/LV Ratio = 1.2) consistent with at least submassive
(intermediate risk) PE. The presence of right heart strain has been
associated with an increased risk of morbidity and mortality.
Critical Value/emergent results were called by telephone at the time
of interpretation on 05/04/2019 at [DATE] to provider Dr. Eimundas,
who verbally acknowledged these results.
2. Aortic Atherosclerosis (FCFV5-1I8.8) and coronary artery disease.
3. Cholelithiasis.

## 2020-11-07 LAB — EXTERNAL GENERIC LAB PROCEDURE: COLOGUARD: NEGATIVE

## 2021-06-11 ENCOUNTER — Other Ambulatory Visit (HOSPITAL_COMMUNITY): Payer: Self-pay | Admitting: Hematology

## 2021-06-11 DIAGNOSIS — C50412 Malignant neoplasm of upper-outer quadrant of left female breast: Secondary | ICD-10-CM

## 2021-06-11 DIAGNOSIS — Z170 Estrogen receptor positive status [ER+]: Secondary | ICD-10-CM

## 2022-01-31 ENCOUNTER — Other Ambulatory Visit: Payer: Self-pay | Admitting: Nurse Practitioner

## 2022-06-03 ENCOUNTER — Other Ambulatory Visit: Payer: Self-pay | Admitting: *Deleted

## 2022-06-03 ENCOUNTER — Other Ambulatory Visit (HOSPITAL_COMMUNITY): Payer: Self-pay | Admitting: Hematology

## 2022-06-03 DIAGNOSIS — C50412 Malignant neoplasm of upper-outer quadrant of left female breast: Secondary | ICD-10-CM

## 2022-06-03 NOTE — Telephone Encounter (Signed)
Anastrozole refill approved.  Patient is tolerating and is to continue therapy.  

## 2023-03-08 ENCOUNTER — Other Ambulatory Visit: Payer: Self-pay | Admitting: *Deleted

## 2023-03-08 ENCOUNTER — Other Ambulatory Visit: Payer: Self-pay | Admitting: Hematology

## 2023-03-08 DIAGNOSIS — C50412 Malignant neoplasm of upper-outer quadrant of left female breast: Secondary | ICD-10-CM

## 2023-03-08 NOTE — Telephone Encounter (Signed)
 Anastrozole refill approved.  Patient is tolerating and is to continue therapy.

## 2023-12-27 ENCOUNTER — Other Ambulatory Visit: Payer: Self-pay | Admitting: Hematology

## 2023-12-27 DIAGNOSIS — C50412 Malignant neoplasm of upper-outer quadrant of left female breast: Secondary | ICD-10-CM
